# Patient Record
Sex: Female | Born: 1945
Health system: Southern US, Community
[De-identification: ages and names within clinical notes are randomized; demographics above are authoritative.]

## PROBLEM LIST (undated history)

## (undated) DIAGNOSIS — J302 Other seasonal allergic rhinitis: Secondary | ICD-10-CM

## (undated) DIAGNOSIS — M545 Low back pain, unspecified: Secondary | ICD-10-CM

## (undated) DIAGNOSIS — J069 Acute upper respiratory infection, unspecified: Secondary | ICD-10-CM

## (undated) DIAGNOSIS — J309 Allergic rhinitis, unspecified: Secondary | ICD-10-CM

## (undated) DIAGNOSIS — E785 Hyperlipidemia, unspecified: Secondary | ICD-10-CM

## (undated) DIAGNOSIS — I10 Essential (primary) hypertension: Secondary | ICD-10-CM

## (undated) DIAGNOSIS — F419 Anxiety disorder, unspecified: Secondary | ICD-10-CM

## (undated) DIAGNOSIS — J189 Pneumonia, unspecified organism: Secondary | ICD-10-CM

## (undated) DIAGNOSIS — C801 Malignant (primary) neoplasm, unspecified: Secondary | ICD-10-CM

## (undated) DIAGNOSIS — R252 Cramp and spasm: Secondary | ICD-10-CM

## (undated) DIAGNOSIS — E7800 Pure hypercholesterolemia, unspecified: Secondary | ICD-10-CM

## (undated) DIAGNOSIS — IMO0002 Reserved for concepts with insufficient information to code with codable children: Secondary | ICD-10-CM

## (undated) DIAGNOSIS — A64 Unspecified sexually transmitted disease: Secondary | ICD-10-CM

## (undated) DIAGNOSIS — J019 Acute sinusitis, unspecified: Secondary | ICD-10-CM

## (undated) DIAGNOSIS — M1990 Unspecified osteoarthritis, unspecified site: Secondary | ICD-10-CM

## (undated) DIAGNOSIS — M8580 Other specified disorders of bone density and structure, unspecified site: Secondary | ICD-10-CM

## (undated) HISTORY — DX: Allergic rhinitis, unspecified: J30.9

## (undated) HISTORY — DX: Other seasonal allergic rhinitis: J30.2

## (undated) HISTORY — DX: Reserved for concepts with insufficient information to code with codable children: IMO0002

## (undated) HISTORY — DX: Anxiety disorder, unspecified: F41.9

## (undated) HISTORY — PX: KNEE SURGERY: SHX244

## (undated) HISTORY — DX: Pure hypercholesterolemia, unspecified: E78.00

## (undated) HISTORY — DX: Hyperlipidemia, unspecified: E78.5

## (undated) HISTORY — PX: BREAST EXCISIONAL BIOPSY: SUR124

## (undated) HISTORY — DX: Cramp and spasm: R25.2

## (undated) HISTORY — DX: Acute upper respiratory infection, unspecified: J06.9

## (undated) HISTORY — PX: OOPHORECTOMY: SHX86

## (undated) HISTORY — DX: Unspecified sexually transmitted disease: A64

## (undated) HISTORY — PX: BREAST SURGERY: SHX581

## (undated) HISTORY — DX: Acute sinusitis, unspecified: J01.90

## (undated) HISTORY — DX: Low back pain, unspecified: M54.50

## (undated) HISTORY — PX: CATARACT EXTRACTION, BILATERAL: SHX1313

---
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
## 1984-01-29 HISTORY — PX: ABDOMINAL HYSTERECTOMY: SHX81

## 1993-01-28 HISTORY — PX: BREAST EXCISIONAL BIOPSY: SUR124

## 1993-01-28 HISTORY — PX: PELVIC LAPAROSCOPY: SHX162

## 1997-08-24 ENCOUNTER — Ambulatory Visit (HOSPITAL_COMMUNITY): Admission: RE | Admit: 1997-08-24 | Discharge: 1997-08-24 | Payer: Self-pay | Admitting: *Deleted

## 1998-04-17 ENCOUNTER — Other Ambulatory Visit: Admission: RE | Admit: 1998-04-17 | Discharge: 1998-04-17 | Payer: Self-pay | Admitting: Obstetrics and Gynecology

## 1999-04-17 ENCOUNTER — Other Ambulatory Visit: Admission: RE | Admit: 1999-04-17 | Discharge: 1999-04-17 | Payer: Self-pay | Admitting: Obstetrics and Gynecology

## 2000-02-07 ENCOUNTER — Encounter: Admission: RE | Admit: 2000-02-07 | Discharge: 2000-02-07 | Payer: Self-pay | Admitting: Obstetrics and Gynecology

## 2000-02-07 ENCOUNTER — Encounter: Payer: Self-pay | Admitting: Obstetrics and Gynecology

## 2000-04-16 ENCOUNTER — Other Ambulatory Visit: Admission: RE | Admit: 2000-04-16 | Discharge: 2000-04-16 | Payer: Self-pay | Admitting: Obstetrics and Gynecology

## 2001-02-04 ENCOUNTER — Encounter: Payer: Self-pay | Admitting: Obstetrics and Gynecology

## 2001-02-04 ENCOUNTER — Encounter: Admission: RE | Admit: 2001-02-04 | Discharge: 2001-02-04 | Payer: Self-pay | Admitting: Obstetrics and Gynecology

## 2001-04-20 ENCOUNTER — Other Ambulatory Visit: Admission: RE | Admit: 2001-04-20 | Discharge: 2001-04-20 | Payer: Self-pay | Admitting: Obstetrics and Gynecology

## 2002-04-19 ENCOUNTER — Other Ambulatory Visit: Admission: RE | Admit: 2002-04-19 | Discharge: 2002-04-19 | Payer: Self-pay | Admitting: Obstetrics and Gynecology

## 2003-04-20 ENCOUNTER — Other Ambulatory Visit: Admission: RE | Admit: 2003-04-20 | Discharge: 2003-04-20 | Payer: Self-pay | Admitting: Obstetrics and Gynecology

## 2003-09-03 ENCOUNTER — Emergency Department (HOSPITAL_COMMUNITY): Admission: EM | Admit: 2003-09-03 | Discharge: 2003-09-03 | Payer: Self-pay | Admitting: Emergency Medicine

## 2004-04-24 ENCOUNTER — Other Ambulatory Visit: Admission: RE | Admit: 2004-04-24 | Discharge: 2004-04-24 | Payer: Self-pay | Admitting: Obstetrics and Gynecology

## 2004-12-26 ENCOUNTER — Ambulatory Visit (HOSPITAL_COMMUNITY): Admission: RE | Admit: 2004-12-26 | Discharge: 2004-12-26 | Payer: Self-pay | Admitting: Gastroenterology

## 2005-02-06 ENCOUNTER — Encounter: Admission: RE | Admit: 2005-02-06 | Discharge: 2005-02-06 | Payer: Self-pay | Admitting: Obstetrics and Gynecology

## 2005-04-25 ENCOUNTER — Other Ambulatory Visit: Admission: RE | Admit: 2005-04-25 | Discharge: 2005-04-25 | Payer: Self-pay | Admitting: Obstetrics and Gynecology

## 2006-04-03 ENCOUNTER — Encounter: Admission: RE | Admit: 2006-04-03 | Discharge: 2006-04-03 | Payer: Self-pay | Admitting: Obstetrics and Gynecology

## 2006-04-28 ENCOUNTER — Other Ambulatory Visit: Admission: RE | Admit: 2006-04-28 | Discharge: 2006-04-28 | Payer: Self-pay | Admitting: Obstetrics and Gynecology

## 2007-01-29 HISTORY — PX: APPENDECTOMY: SHX54

## 2007-04-06 ENCOUNTER — Encounter: Admission: RE | Admit: 2007-04-06 | Discharge: 2007-04-06 | Payer: Self-pay | Admitting: Obstetrics and Gynecology

## 2007-04-09 ENCOUNTER — Encounter: Admission: RE | Admit: 2007-04-09 | Discharge: 2007-04-09 | Payer: Self-pay | Admitting: Internal Medicine

## 2007-04-09 ENCOUNTER — Encounter (INDEPENDENT_AMBULATORY_CARE_PROVIDER_SITE_OTHER): Payer: Self-pay | Admitting: Surgery

## 2007-04-09 ENCOUNTER — Ambulatory Visit (HOSPITAL_COMMUNITY): Admission: EM | Admit: 2007-04-09 | Discharge: 2007-04-11 | Payer: Self-pay | Admitting: Emergency Medicine

## 2007-04-30 ENCOUNTER — Other Ambulatory Visit: Admission: RE | Admit: 2007-04-30 | Discharge: 2007-04-30 | Payer: Self-pay | Admitting: Obstetrics and Gynecology

## 2008-04-27 ENCOUNTER — Encounter: Admission: RE | Admit: 2008-04-27 | Discharge: 2008-04-27 | Payer: Self-pay | Admitting: Obstetrics and Gynecology

## 2008-05-02 ENCOUNTER — Encounter: Payer: Self-pay | Admitting: Obstetrics and Gynecology

## 2008-05-02 ENCOUNTER — Ambulatory Visit: Payer: Self-pay | Admitting: Obstetrics and Gynecology

## 2008-05-02 ENCOUNTER — Other Ambulatory Visit: Admission: RE | Admit: 2008-05-02 | Discharge: 2008-05-02 | Payer: Self-pay | Admitting: Obstetrics and Gynecology

## 2008-12-07 ENCOUNTER — Ambulatory Visit: Payer: Self-pay | Admitting: Obstetrics and Gynecology

## 2008-12-15 ENCOUNTER — Ambulatory Visit: Payer: Self-pay | Admitting: Obstetrics and Gynecology

## 2009-04-28 ENCOUNTER — Encounter: Admission: RE | Admit: 2009-04-28 | Discharge: 2009-04-28 | Payer: Self-pay | Admitting: Obstetrics and Gynecology

## 2009-05-03 ENCOUNTER — Ambulatory Visit: Payer: Self-pay | Admitting: Obstetrics and Gynecology

## 2009-05-03 ENCOUNTER — Other Ambulatory Visit: Admission: RE | Admit: 2009-05-03 | Discharge: 2009-05-03 | Payer: Self-pay | Admitting: Obstetrics and Gynecology

## 2010-03-19 ENCOUNTER — Other Ambulatory Visit: Payer: Self-pay | Admitting: Obstetrics and Gynecology

## 2010-03-19 DIAGNOSIS — Z1231 Encounter for screening mammogram for malignant neoplasm of breast: Secondary | ICD-10-CM

## 2010-04-30 ENCOUNTER — Ambulatory Visit
Admission: RE | Admit: 2010-04-30 | Discharge: 2010-04-30 | Disposition: A | Discharge status: Home / Self Care | Payer: Medicare Other | Source: Ambulatory Visit | Attending: Obstetrics and Gynecology | Admitting: Obstetrics and Gynecology

## 2010-04-30 DIAGNOSIS — Z1231 Encounter for screening mammogram for malignant neoplasm of breast: Secondary | ICD-10-CM

## 2010-05-09 ENCOUNTER — Encounter (INDEPENDENT_AMBULATORY_CARE_PROVIDER_SITE_OTHER): Payer: Medicare Other | Admitting: Obstetrics and Gynecology

## 2010-05-09 ENCOUNTER — Other Ambulatory Visit (HOSPITAL_COMMUNITY)
Admission: RE | Admit: 2010-05-09 | Discharge: 2010-05-09 | Disposition: A | Discharge status: Home / Self Care | Payer: Medicare Other | Source: Ambulatory Visit | Attending: Obstetrics and Gynecology | Admitting: Obstetrics and Gynecology

## 2010-05-09 ENCOUNTER — Other Ambulatory Visit: Payer: Self-pay | Admitting: Obstetrics and Gynecology

## 2010-05-09 DIAGNOSIS — N952 Postmenopausal atrophic vaginitis: Secondary | ICD-10-CM

## 2010-05-09 DIAGNOSIS — N951 Menopausal and female climacteric states: Secondary | ICD-10-CM

## 2010-05-09 DIAGNOSIS — R350 Frequency of micturition: Secondary | ICD-10-CM

## 2010-05-09 DIAGNOSIS — Z124 Encounter for screening for malignant neoplasm of cervix: Secondary | ICD-10-CM

## 2010-05-16 ENCOUNTER — Encounter (INDEPENDENT_AMBULATORY_CARE_PROVIDER_SITE_OTHER): Payer: Medicare Other

## 2010-05-16 DIAGNOSIS — M949 Disorder of cartilage, unspecified: Secondary | ICD-10-CM

## 2010-05-16 DIAGNOSIS — M899 Disorder of bone, unspecified: Secondary | ICD-10-CM

## 2010-06-12 NOTE — H&P (Signed)
NAMELEILANNY, FLUITT NO.:  [id number]   MEDICAL RECORD NO.:  [id number]          PATIENT TYPE:  EMS   LOCATION:  ED                           FACILITY:  [facility]   PHYSICIAN:  Velora Heckler, MD      DATE OF BIRTH:  [birth year]   DATE OF ADMISSION:  04/09/2007  DATE OF DISCHARGE:                              HISTORY & PHYSICAL   PRIMARY CARE PHYSICIAN:  Deirdre Peer. Polite, M.D.   CHIEF COMPLAINT:  Abdominal pain, nausea, vomiting, rule out acute  appendicitis.   HISTORY OF PRESENT ILLNESS:  Diana Cisneros is a pleasant 65 year old white  female from [place], [place].  She presents with a 4 day  history of nausea, vomiting, fevers, chills and abdominal pain.  Over  the past few days, pain has localized to the lower abdomen.  She was  seen and evaluated by her primary physician and sent for CT scan abdomen  and pelvis at [facility].  A CT scan demonstrated evidence of  acute appendicitis with a large appendix extending into the right side  of the pelvis.  The patient was referred to [facility] Emergency  Department for general surgical evaluation and management.   PAST MEDICAL HISTORY:  1. Status post hysterectomy.  2. Status post left breast biopsy by Dr. Cyndia Bent.  3. History of degenerative joint disease lower back.   MEDICATIONS:  Mobic.   ALLERGIES:  NONE KNOWN.   SOCIAL HISTORY:  The patient is married.  She is accompanied by her  husband.  She drinks alcohol on occasion.  She does not smoke.   FAMILY HISTORY:  Unremarkable.   REVIEW OF SYSTEMS:  A 15-system review without significant other  findings except as noted above.   PHYSICAL EXAMINATION:  GENERAL:  A 65 year old thin white female in mild-  moderate discomfort.  VITAL SIGNS:  Temperature 98.2, pulse 96, respirations 18, blood  pressure 131/55.  HEENT:  Shows her to be normocephalic, atraumatic.  Sclerae are clear.  Conjunctiva are clear.  Pupils are equal and reactive.   Dentition good.  Mucous membranes are moist.  Voice normal.  NECK:  Anterior limitation of the neck shows to be symmetric.  Palpation  reveals no dominant thyroid nodules.  No lymphadenopathy.  No  tenderness.  LUNGS:  Clear to auscultation bilaterally without rales, rhonchi or  wheeze.  CARDIAC:  Regular rate and rhythm without murmur.  Peripheral pulses are  full.  ABDOMEN:  Soft, scaphoid.  Bowel sounds were present.  There is  mild-moderate tenderness to palpation and percussion particularly in the  lower quadrants.  There is voluntary guarding.  There is mild rebound  tenderness.  There is no figure of 4 sign.  There is no Rovsing's sign.  EXTREMITIES:  Nontender without edema.  NEUROLOGICALLY:  The patient is alert and oriented.  There is no sign of  tremor.   LABORATORY STUDIES:  White count 10.1, hemoglobin 13.1, platelet count  229,000, pro-thrombin time 14.4, INR 1.1.  Chemistry profile pending.   DIAGNOSTICS:  1. CT scan abdomen and pelvis  with evidence of acute appendicitis.  2. EKG shows normal sinus rhythm.  No acute changes.   IMPRESSION:  Acute appendicitis.   PLAN:  The patient will be admitted on the general surgical service at  [facility].  The patient will be started on  intravenous antibiotics.  She will be prepared and taken directly to the  operating room for laparoscopic appendectomy.  Procedure was discussed  with the patient and her husband.  Risk and benefits were reviewed.  I  quoted them approximately a 10% chance of conversion to open surgery.  They understand and wish to proceed.      Velora Heckler, MD  Electronically Signed     TMG/MEDQ  D:  04/09/2007  T:  04/11/2007  Job:  [id number]   cc:   Deirdre Peer. Polite, M.D.   Candyce Churn, M.D.  Fax: [phone]   Velora Heckler, MD  [phone] [place]  [place] [address]

## 2010-06-12 NOTE — Op Note (Signed)
NAMEDEMIYA, MAGNO                  ACCOUNT NO.:  [id number]   MEDICAL RECORD NO.:  [id number]          PATIENT TYPE:  INP   LOCATION:  0098                         FACILITY:  [facility]   PHYSICIAN:  Velora Heckler, MD      DATE OF BIRTH:  [birth year]   DATE OF PROCEDURE:  04/09/2007  DATE OF DISCHARGE:                               OPERATIVE REPORT   PREOPERATIVE DIAGNOSIS:  Acute appendicitis.   POSTOPERATIVE DIAGNOSIS:  Acute appendicitis.   PROCEDURE:  Laparoscopic appendectomy.   SURGEON:  Velora Heckler, MD, FACS   ANESTHESIA:  General.   ESTIMATED BLOOD LOSS:  Minimal.   PREPARATION:  Betadine.   COMPLICATIONS:  None.   INDICATIONS:  The patient is a 65 year old white female who presents to  the emergency department at [facility] after a 4-day  history of nausea, vomiting and abdominal pain.  The patient was seen by  her primary physician and scheduled for CT scan abdomen and pelvis which  was performed on March 12.  This showed findings consistent with acute  appendicitis.  The patient was brought to [facility] emergency  department for general surgical consultation and management.   DESCRIPTION OF PROCEDURE:  The procedure is done in OR #1 at the [facility].  The patient was brought to the operating room,  placed in a supine position on the operating room table.  Following the  administration of general anesthesia, the patient is positioned and then  prepped and draped in the usual strict aseptic fashion.  After  ascertaining that an adequate level of anesthesia had been achieved, a  previous infraumbilical incision was reopened with a #15 blade.  Dissection was carried through subcutaneous tissues and scar tissue to  the fascia.  Hemostasis was obtained with the electrocautery.  The  fascia was incised in the midline and the peritoneal cavity is entered  cautiously.  A zero Vicryl pursestring suture was placed in the fascia.  A  Hasson cannula was introduced and secured with a pursestring suture.  The abdomen was insufflated with carbon dioxide.  A laparoscope was  introduced and the abdomen explored.  The patient is placed in  Trendelenburg.  Omentum was mobilized off the cecum.  The cecum was  grasped and gently mobilized cephalad.  The base of the appendix was  identified.  It does extend along the right pelvic sidewall.  It is  grasped and gently mobilized out of the pelvis.  There is no gross sign  of perforation.  There is no sign of abscess.  The appendix was markedly  indurated, erythematous and swollen.  The operative ports are placed in  the right upper quadrant and left lower quadrant.  The mesoappendix was  taken down with the harmonic scalpel.  The base of the appendix is  transected with an Endo-GIA stapler.  The appendix was placed into an  EndoCatch bag and withdrawn through the left lower quadrant port.  The  staple line was inspected and shows good hemostasis.  The pelvis was  irrigated with warm  saline which is evacuated.  The ports were removed  under direct vision and good hemostasis was noted at all port sites.  Pneumoperitoneum was released and the umbilical port is removed and  pursestring suture tied securely.  The wounds are anesthetized with  local  anesthetic.  All wounds were closed with interrupted 4-0 Vicryl  subcuticular sutures.  The wounds are washed and dried and Benzoin and  Steri-Strips are applied.  Sterile dressings are applied.  The patient  is awakened from anesthesia and brought to the recovery room in stable  condition.  The patient tolerated the procedure well.      Velora Heckler, MD  Electronically Signed     TMG/MEDQ  D:  04/09/2007  T:  04/11/2007  Job:  [id number]   cc:   Deirdre Peer. Polite, M.D.   Candyce Churn, M.D.  Fax: [phone]

## 2010-06-15 NOTE — Op Note (Signed)
NAMEBRONTE, Diana Cisneros                  ACCOUNT NO.:  [id number]   MEDICAL RECORD NO.:  [id number]          PATIENT TYPE:  AMB   LOCATION:  ENDO                         FACILITY:  [facility]   PHYSICIAN:  Danise Edge, M.D.   DATE OF BIRTH:  [birth year]   DATE OF PROCEDURE:  12/26/2004  DATE OF DISCHARGE:                                 OPERATIVE REPORT   PROCEDURE:  Screening colonoscopy.   REFERRING PHYSICIAN:  Ladell Pier, M.D.   INDICATIONS FOR PROCEDURE:  Ms. Rosia Syme is a 65 year old female born  1945/04/05.  Ms. Mullenax is scheduled to undergo her first screening  colonoscopy with polypectomy to prevent colon cancer.   ENDOSCOPIST:  Danise Edge, M.D.   PREMEDICATION:  Versed 7.5 mg, Demerol 50 mg.   PROCEDURE:  After obtaining informed consent, Ms. Vidovich was placed in the  left lateral decubitus position.  I administered intravenous Demerol and  intravenous Versed to achieve conscious sedation for the procedure.  The  patient's blood pressure, oxygen saturation, and cardiac rhythm were  monitored throughout the procedure and documented in the medical record.   Anal inspection and digital rectal exam was normal.  The Olympus adjustable  pediatric colonoscope was introduced into the rectum and advanced to the  cecum.  A normal-appearing ileocecal valve and appendiceal orifice were  identified.  Colonic preparation for the exam today was satisfactory.   RECTUM:  Normal.  Retroflexed view of the distal rectum normal.   SIGMOID COLON/DESCENDING COLON:  Normal.   SPLENIC FLEXURE:  Normal.   TRANSVERSE COLON:  Normal.   HEPATIC FLEXURE:  Normal.   ASCENDING COLON:  Normal.   CECUM AND ILEOCECAL VALVE:  Normal.   ASSESSMENT:  Normal screening proctocolonoscopy of the cecum.           ______________________________  Danise Edge, M.D.     MJ/MEDQ  D:  12/26/2004  T:  12/26/2004  Job:  [id number]   cc:   Ladell Pier, M.D.  Fax: [phone]

## 2010-08-13 ENCOUNTER — Ambulatory Visit (INDEPENDENT_AMBULATORY_CARE_PROVIDER_SITE_OTHER): Payer: Medicare Other | Admitting: Obstetrics and Gynecology

## 2010-08-13 DIAGNOSIS — N390 Urinary tract infection, site not specified: Secondary | ICD-10-CM

## 2010-08-13 DIAGNOSIS — R82998 Other abnormal findings in urine: Secondary | ICD-10-CM

## 2010-08-13 DIAGNOSIS — R350 Frequency of micturition: Secondary | ICD-10-CM

## 2010-08-13 DIAGNOSIS — N949 Unspecified condition associated with female genital organs and menstrual cycle: Secondary | ICD-10-CM

## 2010-08-22 ENCOUNTER — Encounter: Payer: Self-pay | Admitting: Obstetrics and Gynecology

## 2010-08-22 ENCOUNTER — Ambulatory Visit (INDEPENDENT_AMBULATORY_CARE_PROVIDER_SITE_OTHER): Payer: Medicare Other | Admitting: Obstetrics and Gynecology

## 2010-08-22 DIAGNOSIS — Z5189 Encounter for other specified aftercare: Secondary | ICD-10-CM

## 2010-08-22 NOTE — Progress Notes (Signed)
Diana Cisneros came back today for followup urinalysis. All white blood cells and red blood cells were now gone. She has no symptoms. She was reassured.

## 2010-10-04 ENCOUNTER — Ambulatory Visit (INDEPENDENT_AMBULATORY_CARE_PROVIDER_SITE_OTHER): Payer: Medicare Other | Admitting: Obstetrics and Gynecology

## 2010-10-04 ENCOUNTER — Encounter: Payer: Self-pay | Admitting: Obstetrics and Gynecology

## 2010-10-04 DIAGNOSIS — N390 Urinary tract infection, site not specified: Secondary | ICD-10-CM

## 2010-10-04 DIAGNOSIS — R82998 Other abnormal findings in urine: Secondary | ICD-10-CM

## 2010-10-04 DIAGNOSIS — R350 Frequency of micturition: Secondary | ICD-10-CM

## 2010-10-04 NOTE — Progress Notes (Signed)
Addended by: Landis Martins R on: 10/04/2010 12:59 PM   Modules accepted: Orders

## 2010-10-04 NOTE — Progress Notes (Signed)
The patient came to see me today with dysuria frequency and urgency. Her urinalysis showed 20+ white blood cells. We just treat her for UTI in July with Macrobid. She become asymptomatic with treatment. Her followup UA after treatment was normal. Prior to this she had not had a UTI in many many years.  Assessment: Recurrent UTI  Plan: Cipro 500 mg twice a day for 7 days. Followup UA and culture in one week.

## 2010-10-22 LAB — DIFFERENTIAL
Basophils Absolute: 0.1
Basophils Relative: 1
Eosinophils Absolute: 0.2
Eosinophils Relative: 2
Lymphocytes Relative: 18
Lymphs Abs: 1.8
Monocytes Absolute: 0.6
Monocytes Relative: 6
Neutro Abs: 7.4
Neutrophils Relative %: 74

## 2010-10-22 LAB — CBC
HCT: 38.7
Hemoglobin: 13.1
MCHC: 33.9
MCV: 89.6
Platelets: 229
RBC: 4.32
RDW: 13.1
WBC: 10.1

## 2010-10-22 LAB — URINALYSIS, ROUTINE W REFLEX MICROSCOPIC
Glucose, UA: NEGATIVE
Nitrite: NEGATIVE
Protein, ur: NEGATIVE
pH: 6

## 2010-10-22 LAB — BASIC METABOLIC PANEL
Chloride: 100
Creatinine, Ser: 1.22 — ABNORMAL HIGH
GFR calc Af Amer: 54 — ABNORMAL LOW
GFR calc non Af Amer: 45 — ABNORMAL LOW

## 2010-10-22 LAB — PROTIME-INR
INR: 1.1
Prothrombin Time: 14.4

## 2011-01-07 ENCOUNTER — Other Ambulatory Visit: Payer: Self-pay | Admitting: Obstetrics and Gynecology

## 2011-04-02 ENCOUNTER — Other Ambulatory Visit: Payer: Self-pay | Admitting: Obstetrics and Gynecology

## 2011-04-02 DIAGNOSIS — Z1231 Encounter for screening mammogram for malignant neoplasm of breast: Secondary | ICD-10-CM

## 2011-04-23 ENCOUNTER — Ambulatory Visit (INDEPENDENT_AMBULATORY_CARE_PROVIDER_SITE_OTHER): Payer: Medicare Other | Admitting: Obstetrics and Gynecology

## 2011-04-23 ENCOUNTER — Other Ambulatory Visit: Payer: Self-pay | Admitting: Obstetrics and Gynecology

## 2011-04-23 ENCOUNTER — Telehealth: Payer: Self-pay | Admitting: *Deleted

## 2011-04-23 DIAGNOSIS — R350 Frequency of micturition: Secondary | ICD-10-CM

## 2011-04-23 DIAGNOSIS — R300 Dysuria: Secondary | ICD-10-CM

## 2011-04-23 LAB — URINALYSIS W MICROSCOPIC + REFLEX CULTURE
Casts: NONE SEEN
Crystals: NONE SEEN
Nitrite: NEGATIVE
Specific Gravity, Urine: <1.005 — ABNORMAL LOW (ref 1.005–1.030)
Urobilinogen, UA: 0.2 mg/dL (ref 0.0–1.0)
pH: 5 (ref 5.0–8.0)

## 2011-04-23 MED ORDER — CIPROFLOXACIN HCL 500 MG PO TABS: 500.0000 mg | ORAL_TABLET | Freq: Two times a day (BID) | ORAL | Status: AC

## 2011-04-23 NOTE — Progress Notes (Signed)
Patient was doing well until this morning when she woke up and had to constantly go to the bathroom. She also has noticed pain after urination. We have treated her for UTI with Macrodantin last summer. That was the first UTI she had ever had.  Urinalysis: Too numerous to count white blood cells and red blood cells. Many bacteria present.  Assessment: Urinary tract infection  Plan: Cipro 500 mg twice a day for 7 days. Urine was cultured today. She will return for followup urinalysis in one week.

## 2011-04-23 NOTE — Telephone Encounter (Signed)
Pt called stating that pharmacy never got cipro 500 mg, rx sent to pharmacy. Pt informed

## 2011-05-01 ENCOUNTER — Ambulatory Visit
Admission: RE | Admit: 2011-05-01 | Discharge: 2011-05-01 | Disposition: A | Discharge status: Home / Self Care | Payer: Medicare Other | Source: Ambulatory Visit | Attending: Obstetrics and Gynecology | Admitting: Obstetrics and Gynecology

## 2011-05-01 DIAGNOSIS — Z1231 Encounter for screening mammogram for malignant neoplasm of breast: Secondary | ICD-10-CM

## 2011-05-15 ENCOUNTER — Encounter: Payer: Self-pay | Admitting: Obstetrics and Gynecology

## 2011-05-15 ENCOUNTER — Ambulatory Visit (INDEPENDENT_AMBULATORY_CARE_PROVIDER_SITE_OTHER): Payer: Medicare Other | Admitting: Obstetrics and Gynecology

## 2011-05-15 VITALS — BP 120/74 | Ht 61.0 in | Wt 140.0 lb

## 2011-05-15 DIAGNOSIS — M8580 Other specified disorders of bone density and structure, unspecified site: Secondary | ICD-10-CM

## 2011-05-15 DIAGNOSIS — IMO0002 Reserved for concepts with insufficient information to code with codable children: Secondary | ICD-10-CM | POA: Insufficient documentation

## 2011-05-15 DIAGNOSIS — N390 Urinary tract infection, site not specified: Secondary | ICD-10-CM

## 2011-05-15 DIAGNOSIS — N952 Postmenopausal atrophic vaginitis: Secondary | ICD-10-CM

## 2011-05-15 DIAGNOSIS — R252 Cramp and spasm: Secondary | ICD-10-CM

## 2011-05-15 DIAGNOSIS — M949 Disorder of cartilage, unspecified: Secondary | ICD-10-CM

## 2011-05-15 NOTE — Progress Notes (Signed)
Patient came back today for further followup. In the last month we treated her with her urinary tract infection with relief of all her symptoms. We also previously treated her last summer. She does  not think there's a sexual relationship to the infections. She left [place] a urine today for treatment of cure. Her menopausal symptoms are  mostly resolved. She does have vaginal dryness with some dyspareunia but is fine with just using a lubricant rather than vaginal estrogen. She is having no pelvic pain. She is up-to-date on mammograms. Her last bone density showed osteopenia without elevated FRAX risk. She's had no fractures. She takes her calcium and  her vitamin D. Her leg cramps are responding to over-the-counter medication.  ROS: 12 system review done. Pertinent positives above. Other positive is degenerative disc disease.  HEENT: Within normal limits. Diana Cisneros present Neck: No masses. Supraclavicular lymph nodes: Not enlarged. Breasts: Examined in both sitting and lying position. Symmetrical without skin changes or masses. Abdomen: Soft no masses guarding or rebound. No hernias. Pelvic: External within normal limits. BUS within normal limits. Vaginal examination shows poor estrogen effect, no cystocele enterocele or rectocele. Cervix and uterus absent. Adnexa within normal limits. Rectovaginal confirmatory. Extremities within normal limits.  Assessment: #1. Urinary tract infection #2. Atrophic vaginitis #3. Osteopenia #4. Leg cramps  Plan: Urinalysis checked. Discussed vaginal estrogen for persistent UTIs or atrophic issues. I don't believe she needs urological referral at the moment. Continue yearly mammograms. Continue bone densities every other year.

## 2011-05-16 LAB — URINALYSIS W MICROSCOPIC + REFLEX CULTURE
Bilirubin Urine: NEGATIVE
Crystals: NONE SEEN
Glucose, UA: NEGATIVE mg/dL
Leukocytes, UA: NEGATIVE
Protein, ur: NEGATIVE mg/dL
Specific Gravity, Urine: 1.011 (ref 1.005–1.030)
Squamous Epithelial / LPF: NONE SEEN
Urobilinogen, UA: 0.2 mg/dL (ref 0.0–1.0)

## 2011-10-10 ENCOUNTER — Other Ambulatory Visit: Payer: Self-pay | Admitting: Obstetrics and Gynecology

## 2012-02-21 ENCOUNTER — Other Ambulatory Visit: Payer: Self-pay | Admitting: Obstetrics and Gynecology

## 2012-02-21 NOTE — Telephone Encounter (Signed)
Per Dr. Verl Dicker 04/28/06 note patient "has vulvar inflammation from time to time" and he prescribed Diprisone Cream to apply prn.  She has had it refilled a couple of times since 2008.

## 2012-03-24 ENCOUNTER — Other Ambulatory Visit: Payer: Self-pay | Admitting: Gynecology

## 2012-03-24 DIAGNOSIS — Z1231 Encounter for screening mammogram for malignant neoplasm of breast: Secondary | ICD-10-CM

## 2012-05-01 ENCOUNTER — Ambulatory Visit
Admission: RE | Admit: 2012-05-01 | Discharge: 2012-05-01 | Disposition: A | Discharge status: Home / Self Care | Payer: Medicare Other | Source: Ambulatory Visit | Attending: Gynecology | Admitting: Gynecology

## 2012-05-01 DIAGNOSIS — Z1231 Encounter for screening mammogram for malignant neoplasm of breast: Secondary | ICD-10-CM

## 2012-05-05 ENCOUNTER — Ambulatory Visit: Payer: Medicare Other

## 2012-05-18 ENCOUNTER — Encounter: Payer: Self-pay | Admitting: Gynecology

## 2012-05-18 ENCOUNTER — Encounter: Payer: Self-pay | Admitting: Obstetrics and Gynecology

## 2012-05-18 ENCOUNTER — Ambulatory Visit (INDEPENDENT_AMBULATORY_CARE_PROVIDER_SITE_OTHER): Payer: Medicare Other | Admitting: Gynecology

## 2012-05-18 VITALS — BP 120/74 | Ht 62.0 in | Wt 129.0 lb

## 2012-05-18 DIAGNOSIS — M949 Disorder of cartilage, unspecified: Secondary | ICD-10-CM

## 2012-05-18 DIAGNOSIS — M8580 Other specified disorders of bone density and structure, unspecified site: Secondary | ICD-10-CM

## 2012-05-18 DIAGNOSIS — N952 Postmenopausal atrophic vaginitis: Secondary | ICD-10-CM

## 2012-05-18 NOTE — Patient Instructions (Signed)
Follow up one year, sooner as needed.

## 2012-05-18 NOTE — Progress Notes (Signed)
Diana Cisneros 12-02-1945 [id number]        66 y.o.  G0P0 for followup exam.  Several issues noted below.  Past medical history,surgical history, medications, allergies, family history and social history were all reviewed and documented in the EPIC chart. ROS:  Was performed and pertinent positives and negatives are included in the history.  Exam: Kim assistant Filed Vitals:   05/18/12 1056  BP: 120/74  Height: 5\' 2"  (1.575 m)  Weight: 129 lb (58.514 kg)   General appearance  Normal Skin grossly normal Head/Neck normal with no cervical or supraclavicular adenopathy thyroid normal Lungs  clear Cardiac RR, without RMG Abdominal  soft, nontender, without masses, organomegaly or hernia Breasts  examined lying and sitting without masses, retractions, discharge or axillary adenopathy. Pelvic  Ext/BUS/vagina  normal with atrophic changes  Adnexa  Without masses or tenderness    Anus and perineum  normal   Rectovaginal  normal sphincter tone without palpated masses or tenderness.    Assessment/Plan:  67 y.o. G0P0 female for annual exam.   1. Status post TAH LSO subsequent RSO for history of endometriosis. Without significant postmenopausal symptoms of hot flashes/night sweats. We'll continue to monitor. 2. Atrophic vaginitis. Some dryness using OTC products which help. Discussed possible estrogen treatment and she declines. We'll followup as needed. 3. Osteopenia. DEXA 04/2010 T score -1.2 FRAX 8.4%/0.7% stable from prior DEXA on serial studies. Recommend repeat in several years. Continue on extra calcium and vitamin D as she is doing. 4. Mammography 04/2012. Continued annual mammography. SBE monthly reviewed. 5. Pap smear 2012. No Pap smear done today. Reviewed current screening guidelines. She is over the age of 54 and status post hysterectomy for benign indications. Options to stop screening altogether versus less frequent screening interval reviewed. We'll readdress on an annual  basis. 6. Colonoscopy 2008 with recommended repeat in 10 years. 7. Health maintenance. Has an appointment to see her primary physician in one month. No blood work done as this will all be done through their office. Followup one year, sooner as needed.    Dara Lords MD, 11:21 AM 05/18/2012

## 2012-05-19 LAB — URINALYSIS W MICROSCOPIC + REFLEX CULTURE
Casts: NONE SEEN
Crystals: NONE SEEN
Glucose, UA: NEGATIVE mg/dL
Hgb urine dipstick: NEGATIVE
Leukocytes, UA: NEGATIVE
Specific Gravity, Urine: 1.008 (ref 1.005–1.030)
Squamous Epithelial / LPF: NONE SEEN
pH: 5 (ref 5.0–8.0)

## 2012-11-18 ENCOUNTER — Ambulatory Visit: Payer: Medicare Other | Admitting: Podiatry

## 2012-11-23 ENCOUNTER — Ambulatory Visit (INDEPENDENT_AMBULATORY_CARE_PROVIDER_SITE_OTHER): Payer: Medicare Other | Admitting: Podiatry

## 2012-11-23 ENCOUNTER — Encounter: Payer: Self-pay | Admitting: Podiatry

## 2012-11-23 VITALS — BP 125/72 | HR 76 | Resp 12

## 2012-11-23 DIAGNOSIS — L84 Corns and callosities: Secondary | ICD-10-CM

## 2012-11-24 NOTE — Progress Notes (Signed)
Subjective:     Patient ID: Diana Cisneros, female   DOB: Aug 14, 1945, 67 y.o.   MRN: [id number]  HPI patient presents with lesions on the bottom of both feet that become painful  Review of Systems     Objective:   Physical Exam  Nursing note and vitals reviewed. Cardiovascular: Intact distal pulses.    keratotic lesions sub-second metatarsal bi lateral    Assessment:     Keratotic lesion secondary to bone pressure    Plan:     Debridement of lesions bilateral with no iatrogenic bleeding noted

## 2013-02-15 ENCOUNTER — Encounter: Payer: Self-pay | Admitting: Podiatry

## 2013-02-15 ENCOUNTER — Ambulatory Visit (INDEPENDENT_AMBULATORY_CARE_PROVIDER_SITE_OTHER): Payer: Medicare Other | Admitting: Podiatry

## 2013-02-15 VITALS — BP 119/75 | HR 86 | Resp 16

## 2013-02-15 DIAGNOSIS — L84 Corns and callosities: Secondary | ICD-10-CM

## 2013-02-15 NOTE — Progress Notes (Signed)
Subjective:     Patient ID: Diana Cisneros, female   DOB: [birth year], 68 y.o.   MRN: [id number]  HPI patient presents with thick keratotic lesion sub-third metatarsal both feet are painful and thick second toenails both feet   Review of Systems     Objective:   Physical Exam Neurovascular status unchanged with nail disease second both feet and fifth keratotic lesions sub-third metatarsal both feet    Assessment:     Mycotic nails with pain 1-5 both feet and lesions of both feet    Plan:     Debridement painful nail bed 1-5 both feet and debridement lesions both feet with no bleeding noted

## 2013-03-30 ENCOUNTER — Other Ambulatory Visit: Payer: Self-pay

## 2013-03-30 DIAGNOSIS — Z1231 Encounter for screening mammogram for malignant neoplasm of breast: Secondary | ICD-10-CM

## 2013-04-14 ENCOUNTER — Ambulatory Visit: Payer: Medicare Other | Admitting: Podiatry

## 2013-04-15 ENCOUNTER — Encounter: Payer: Self-pay | Admitting: Podiatry

## 2013-04-15 ENCOUNTER — Ambulatory Visit (INDEPENDENT_AMBULATORY_CARE_PROVIDER_SITE_OTHER): Payer: Medicare Other | Admitting: Podiatry

## 2013-04-15 VITALS — BP 129/78 | HR 76 | Resp 16

## 2013-04-15 DIAGNOSIS — L84 Corns and callosities: Secondary | ICD-10-CM

## 2013-04-15 DIAGNOSIS — M79609 Pain in unspecified limb: Secondary | ICD-10-CM

## 2013-04-15 DIAGNOSIS — B351 Tinea unguium: Secondary | ICD-10-CM

## 2013-04-16 NOTE — Progress Notes (Signed)
Subjective:     Patient ID: Diana Cisneros, female   DOB: May 07, 1945, 68 y.o.   MRN: [id number]  HPI patient presents with callus formation plantar aspect of metatarsals both feet   Review of Systems     Objective:   Physical Exam Neurovascular status intact with thick plantar lesions both feet    Assessment:     Lesions that are painful    Plan:     Debridement painful lesions both feet

## 2013-05-03 ENCOUNTER — Ambulatory Visit
Admission: RE | Admit: 2013-05-03 | Discharge: 2013-05-03 | Disposition: A | Discharge status: Home / Self Care | Payer: Medicare Other | Source: Ambulatory Visit

## 2013-05-03 DIAGNOSIS — Z1231 Encounter for screening mammogram for malignant neoplasm of breast: Secondary | ICD-10-CM

## 2013-05-19 ENCOUNTER — Ambulatory Visit (INDEPENDENT_AMBULATORY_CARE_PROVIDER_SITE_OTHER): Payer: Medicare Other | Admitting: Gynecology

## 2013-05-19 ENCOUNTER — Encounter: Payer: Self-pay | Admitting: Gynecology

## 2013-05-19 VITALS — BP 130/80 | Ht 62.0 in | Wt 138.0 lb

## 2013-05-19 DIAGNOSIS — M899 Disorder of bone, unspecified: Secondary | ICD-10-CM

## 2013-05-19 DIAGNOSIS — M949 Disorder of cartilage, unspecified: Secondary | ICD-10-CM

## 2013-05-19 DIAGNOSIS — M8580 Other specified disorders of bone density and structure, unspecified site: Secondary | ICD-10-CM

## 2013-05-19 DIAGNOSIS — N952 Postmenopausal atrophic vaginitis: Secondary | ICD-10-CM

## 2013-05-19 NOTE — Patient Instructions (Signed)
Schedule bone density. Followup in one year for annual exam, sooner if any issues.  Health Maintenance, Female A healthy lifestyle and preventative care can promote health and wellness.  Maintain regular health, dental, and eye exams.  Eat a healthy diet. Foods like vegetables, fruits, whole grains, low-fat dairy products, and lean protein foods contain the nutrients you need without too many calories. Decrease your intake of foods high in solid fats, added sugars, and salt. Get information about a proper diet from your caregiver, if necessary.  Regular physical exercise is one of the most important things you can do for your health. Most adults should get at least 150 minutes of moderate-intensity exercise (any activity that increases your heart rate and causes you to sweat) each week. In addition, most adults need muscle-strengthening exercises on 2 or more days a week.   Maintain a healthy weight. The body mass index (BMI) is a screening tool to identify possible weight problems. It provides an estimate of body fat based on height and weight. Your caregiver can help determine your BMI, and can help you achieve or maintain a healthy weight. For adults 20 years and older:  A BMI below 18.5 is considered underweight.  A BMI of 18.5 to 24.9 is normal.  A BMI of 25 to 29.9 is considered overweight.  A BMI of 30 and above is considered obese.  Maintain normal blood lipids and cholesterol by exercising and minimizing your intake of saturated fat. Eat a balanced diet with plenty of fruits and vegetables. Blood tests for lipids and cholesterol should begin at age 23 and be repeated every 5 years. If your lipid or cholesterol levels are high, you are over 50, or you are a high risk for heart disease, you may need your cholesterol levels checked more frequently.Ongoing high lipid and cholesterol levels should be treated with medicines if diet and exercise are not effective.  If you smoke, find out  from your caregiver how to quit. If you do not use tobacco, do not start.  Lung cancer screening is recommended for adults aged 47 80 years who are at high risk for developing lung cancer because of a history of smoking. Yearly low-dose computed tomography (CT) is recommended for people who have at least a 30-pack-year history of smoking and are a current smoker or have quit within the past 15 years. A pack year of smoking is smoking an average of 1 pack of cigarettes a day for 1 year (for example: 1 pack a day for 30 years or 2 packs a day for 15 years). Yearly screening should continue until the smoker has stopped smoking for at least 15 years. Yearly screening should also be stopped for people who develop a health problem that would prevent them from having lung cancer treatment.  If you are pregnant, do not drink alcohol. If you are breastfeeding, be very cautious about drinking alcohol. If you are not pregnant and choose to drink alcohol, do not exceed 1 drink per day. One drink is considered to be 12 ounces (355 mL) of beer, 5 ounces (148 mL) of wine, or 1.5 ounces (44 mL) of liquor.  Avoid use of street drugs. Do not share needles with anyone. Ask for help if you need support or instructions about stopping the use of drugs.  High blood pressure causes heart disease and increases the risk of stroke. Blood pressure should be checked at least every 1 to 2 years. Ongoing high blood pressure should be treated with  medicines, if weight loss and exercise are not effective.  If you are 6 to 68 years old, ask your caregiver if you should take aspirin to prevent strokes.  Diabetes screening involves taking a blood sample to check your fasting blood sugar level. This should be done once every 3 years, after age 42, if you are within normal weight and without risk factors for diabetes. Testing should be considered at a younger age or be carried out more frequently if you are overweight and have at least 1  risk factor for diabetes.  Breast cancer screening is essential preventative care for women. You should practice "breast self-awareness." This means understanding the normal appearance and feel of your breasts and may include breast self-examination. Any changes detected, no matter how small, should be reported to a caregiver. Women in their 72s and 30s should have a clinical breast exam (CBE) by a caregiver as part of a regular health exam every 1 to 3 years. After age 76, women should have a CBE every year. Starting at age 36, women should consider having a mammogram (breast X-ray) every year. Women who have a family history of breast cancer should talk to their caregiver about genetic screening. Women at a high risk of breast cancer should talk to their caregiver about having an MRI and a mammogram every year.  Breast cancer gene (BRCA)-related cancer risk assessment is recommended for women who have family members with BRCA-related cancers. BRCA-related cancers include breast, ovarian, tubal, and peritoneal cancers. Having family members with these cancers may be associated with an increased risk for harmful changes (mutations) in the breast cancer genes BRCA1 and BRCA2. Results of the assessment will determine the need for genetic counseling and BRCA1 and BRCA2 testing.  The Pap test is a screening test for cervical cancer. Women should have a Pap test starting at age 41. Between ages 55 and 51, Pap tests should be repeated every 2 years. Beginning at age 28, you should have a Pap test every 3 years as long as the past 3 Pap tests have been normal. If you had a hysterectomy for a problem that was not cancer or a condition that could lead to cancer, then you no longer need Pap tests. If you are between ages 39 and 66, and you have had normal Pap tests going back 10 years, you no longer need Pap tests. If you have had past treatment for cervical cancer or a condition that could lead to cancer, you need Pap  tests and screening for cancer for at least 20 years after your treatment. If Pap tests have been discontinued, risk factors (such as a new sexual partner) need to be reassessed to determine if screening should be resumed. Some women have medical problems that increase the chance of getting cervical cancer. In these cases, your caregiver may recommend more frequent screening and Pap tests.  The human papillomavirus (HPV) test is an additional test that may be used for cervical cancer screening. The HPV test looks for the virus that can cause the cell changes on the cervix. The cells collected during the Pap test can be tested for HPV. The HPV test could be used to screen women aged 26 years and older, and should be used in women of any age who have unclear Pap test results. After the age of 53, women should have HPV testing at the same frequency as a Pap test.  Colorectal cancer can be detected and often prevented. Most routine colorectal cancer  screening begins at the age of 28 and continues through age 51. However, your caregiver may recommend screening at an earlier age if you have risk factors for colon cancer. On a yearly basis, your caregiver may provide home test kits to check for hidden blood in the stool. Use of a small camera at the end of a tube, to directly examine the colon (sigmoidoscopy or colonoscopy), can detect the earliest forms of colorectal cancer. Talk to your caregiver about this at age 39, when routine screening begins. Direct examination of the colon should be repeated every 5 to 10 years through age 46, unless early forms of pre-cancerous polyps or small growths are found.  Hepatitis C blood testing is recommended for all people born from [birth year] through 1965 and any individual with known risks for hepatitis C.  Practice safe sex. Use condoms and avoid high-risk sexual practices to reduce the spread of sexually transmitted infections (STIs). Sexually active women aged 30 and younger  should be checked for Chlamydia, which is a common sexually transmitted infection. Older women with new or multiple partners should also be tested for Chlamydia. Testing for other STIs is recommended if you are sexually active and at increased risk.  Osteoporosis is a disease in which the bones lose minerals and strength with aging. This can result in serious bone fractures. The risk of osteoporosis can be identified using a bone density scan. Women ages 39 and over and women at risk for fractures or osteoporosis should discuss screening with their caregivers. Ask your caregiver whether you should be taking a calcium supplement or vitamin D to reduce the rate of osteoporosis.  Menopause can be associated with physical symptoms and risks. Hormone replacement therapy is available to decrease symptoms and risks. You should talk to your caregiver about whether hormone replacement therapy is right for you.  Use sunscreen. Apply sunscreen liberally and repeatedly throughout the day. You should seek shade when your shadow is shorter than you. Protect yourself by wearing long sleeves, pants, a wide-brimmed hat, and sunglasses year round, whenever you are outdoors.  Notify your caregiver of new moles or changes in moles, especially if there is a change in shape or color. Also notify your caregiver if a mole is larger than the size of a pencil eraser.  Stay current with your immunizations. Document Released: 07/30/2010 Document Revised: 05/11/2012 Document Reviewed: 07/30/2010 [facility] Patient Information 2014 [place].

## 2013-05-19 NOTE — Progress Notes (Signed)
Diana Cisneros 08-01-1945 [id number]        68 y.o.  G0P0 for followup exam.  Several issues noted below.  Past medical history,surgical history, problem list, medications, allergies, family history and social history were all reviewed and documented as reviewed in the EPIC chart.  ROS:  12 system ROS performed with pertinent positives and negatives included in the history, assessment and plan.  Included Systems: General, HEENT, Neck, Cardiovascular, Pulmonary, Gastrointestinal, Genitourinary, Musculoskeletal, Dermatologic, Endocrine, Hematological, Neurologic, Psychiatric Additional significant findings : Seasonal allergy symptoms treated with OTC products   Exam: Kim assistant Filed Vitals:   05/19/13 0838  BP: 130/80  Height: 5\' 2"  (1.575 m)  Weight: 138 lb (62.596 kg)   General appearance:  Normal affect, orientation and appearance. Skin: Grossly normal HEENT: Normal without gross oral lesions, cervical or supraclavicular adenopathy. Thyroid normal.  Lungs:  Clear without wheezing, rales or rhonchi Cardiac: RR, without RMG Abdominal:  Soft, nontender, without masses, guarding, rebound, organomegaly or hernia Breasts:  Examined lying and sitting without masses, retractions, discharge or axillary adenopathy. Pelvic:  Ext/BUS/vagina with generalized atrophic changes  Adnexa  Without masses or tenderness    Anus and perineum  Normal   Rectovaginal  Normal sphincter tone without palpated masses or tenderness.    Assessment/Plan:  68 y.o. G0P0 female for followup.   1. Atrophic genital changes/post menopausal. Status post TAH LSO subsequent RSO for endometriosis. Without significant hot flushes or night sweats. Does have vaginal dryness/dyspareunia and uses OTC products for this. Is not interested in prescription products. Will continue to monitor followup as needed. 2. Osteopenia. DEXA 04/2010 T score -1.2 FRAX 8.4%/0.7%. Repeat DEXA this year. Increase calcium vitamin D  reviewed. 3. Pap smear 2012. No Pap smear done today. No history of abnormal Pap smears. Over the age of 11. Status post hysterectomy for benign indications. We both reviewed current screening guidelines are comfortable with stop screening. 4. Mammography 04/2013. Continue with annual mammography. SBE monthly review. 5. Colonoscopy 7-8 years ago. Planned repeat interval 10 years per her history. 6. Health maintenance. No routine blood work done as this is reportedly done through her primary physician's office. Followup one year, sooner as needed.   Note: This document was prepared with digital dictation and possible smart phrase technology. Any transcriptional errors that result from this process are unintentional.   Anastasio Auerbach MD, 9:04 AM 05/19/2013

## 2013-05-20 LAB — URINALYSIS W MICROSCOPIC + REFLEX CULTURE
BACTERIA UA: NONE SEEN
Bilirubin Urine: NEGATIVE
CRYSTALS: NONE SEEN
Casts: NONE SEEN
Glucose, UA: NEGATIVE mg/dL
Hgb urine dipstick: NEGATIVE
Ketones, ur: NEGATIVE mg/dL
LEUKOCYTES UA: NEGATIVE
NITRITE: NEGATIVE
Protein, ur: NEGATIVE mg/dL
SPECIFIC GRAVITY, URINE: 1.014 (ref 1.005–1.030)
SQUAMOUS EPITHELIAL / LPF: NONE SEEN
UROBILINOGEN UA: 0.2 mg/dL (ref 0.0–1.0)
pH: 6 (ref 5.0–8.0)

## 2013-06-10 ENCOUNTER — Ambulatory Visit (INDEPENDENT_AMBULATORY_CARE_PROVIDER_SITE_OTHER): Payer: Medicare Other

## 2013-06-10 ENCOUNTER — Other Ambulatory Visit: Payer: Self-pay | Admitting: Gynecology

## 2013-06-10 DIAGNOSIS — Z780 Asymptomatic menopausal state: Secondary | ICD-10-CM

## 2013-06-10 DIAGNOSIS — M8580 Other specified disorders of bone density and structure, unspecified site: Secondary | ICD-10-CM

## 2013-09-27 ENCOUNTER — Encounter: Payer: Self-pay | Admitting: Podiatry

## 2013-09-27 ENCOUNTER — Ambulatory Visit (INDEPENDENT_AMBULATORY_CARE_PROVIDER_SITE_OTHER): Payer: Medicare Other | Admitting: Podiatry

## 2013-09-27 DIAGNOSIS — L84 Corns and callosities: Secondary | ICD-10-CM

## 2013-09-27 NOTE — Progress Notes (Signed)
Subjective:     Patient ID: Diana Cisneros, female   DOB: 1945-04-23, 68 y.o.   MRN: [id number]  HPI patient presents with calluses on the plantar aspect of both feet   Review of Systems     Objective:   Physical Exam Neurovascular status intact with keratotic lesion plantar aspect of both feet that's painful when pressed    Assessment:     Chronic keratotic lesion of both feet    Plan:     Debridement lesions on both feet with no bleeding noted

## 2013-12-16 ENCOUNTER — Other Ambulatory Visit: Payer: Self-pay | Admitting: Obstetrics and Gynecology

## 2014-05-30 ENCOUNTER — Other Ambulatory Visit: Payer: Self-pay

## 2014-05-30 ENCOUNTER — Ambulatory Visit (INDEPENDENT_AMBULATORY_CARE_PROVIDER_SITE_OTHER): Payer: Medicare Other | Admitting: Gynecology

## 2014-05-30 ENCOUNTER — Encounter: Payer: Self-pay | Admitting: Gynecology

## 2014-05-30 VITALS — BP 122/78 | Ht 62.0 in | Wt 139.0 lb

## 2014-05-30 DIAGNOSIS — N952 Postmenopausal atrophic vaginitis: Secondary | ICD-10-CM

## 2014-05-30 DIAGNOSIS — Z01419 Encounter for gynecological examination (general) (routine) without abnormal findings: Secondary | ICD-10-CM | POA: Diagnosis not present

## 2014-05-30 DIAGNOSIS — Z1231 Encounter for screening mammogram for malignant neoplasm of breast: Secondary | ICD-10-CM

## 2014-05-30 NOTE — Patient Instructions (Signed)
You may obtain a copy of any labs that were done today by logging onto MyChart as outlined in the instructions provided with your AVS (after visit summary). The office will not call with normal lab results but certainly if there are any significant abnormalities then we will contact you.   Health Maintenance, Female A healthy lifestyle and preventative care can promote health and wellness.  Maintain regular health, dental, and eye exams.  Eat a healthy diet. Foods like vegetables, fruits, whole grains, low-fat dairy products, and lean protein foods contain the nutrients you need without too many calories. Decrease your intake of foods high in solid fats, added sugars, and salt. Get information about a proper diet from your caregiver, if necessary.  Regular physical exercise is one of the most important things you can do for your health. Most adults should get at least 150 minutes of moderate-intensity exercise (any activity that increases your heart rate and causes you to sweat) each week. In addition, most adults need muscle-strengthening exercises on 2 or more days a week.   Maintain a healthy weight. The body mass index (BMI) is a screening tool to identify possible weight problems. It provides an estimate of body fat based on height and weight. Your caregiver can help determine your BMI, and can help you achieve or maintain a healthy weight. For adults 20 years and older:  A BMI below 18.5 is considered underweight.  A BMI of 18.5 to 24.9 is normal.  A BMI of 25 to 29.9 is considered overweight.  A BMI of 30 and above is considered obese.  Maintain normal blood lipids and cholesterol by exercising and minimizing your intake of saturated fat. Eat a balanced diet with plenty of fruits and vegetables. Blood tests for lipids and cholesterol should begin at age 66 and be repeated every 5 years. If your lipid or cholesterol levels are high, you are over 50, or you are a high risk for heart  disease, you may need your cholesterol levels checked more frequently.Ongoing high lipid and cholesterol levels should be treated with medicines if diet and exercise are not effective.  If you smoke, find out from your caregiver how to quit. If you do not use tobacco, do not start.  Lung cancer screening is recommended for adults aged 18 80 years who are at high risk for developing lung cancer because of a history of smoking. Yearly low-dose computed tomography (CT) is recommended for people who have at least a 30-pack-year history of smoking and are a current smoker or have quit within the past 15 years. A pack year of smoking is smoking an average of 1 pack of cigarettes a day for 1 year (for example: 1 pack a day for 30 years or 2 packs a day for 15 years). Yearly screening should continue until the smoker has stopped smoking for at least 15 years. Yearly screening should also be stopped for people who develop a health problem that would prevent them from having lung cancer treatment.  If you are pregnant, do not drink alcohol. If you are breastfeeding, be very cautious about drinking alcohol. If you are not pregnant and choose to drink alcohol, do not exceed 1 drink per day. One drink is considered to be 12 ounces (355 mL) of beer, 5 ounces (148 mL) of wine, or 1.5 ounces (44 mL) of liquor.  Avoid use of street drugs. Do not share needles with anyone. Ask for help if you need support or instructions about stopping  the use of drugs.  High blood pressure causes heart disease and increases the risk of stroke. Blood pressure should be checked at least every 1 to 2 years. Ongoing high blood pressure should be treated with medicines, if weight loss and exercise are not effective.  If you are 59 to 69 years old, ask your caregiver if you should take aspirin to prevent strokes.  Diabetes screening involves taking a blood sample to check your fasting blood sugar level. This should be done once every 3  years, after age 91, if you are within normal weight and without risk factors for diabetes. Testing should be considered at a younger age or be carried out more frequently if you are overweight and have at least 1 risk factor for diabetes.  Breast cancer screening is essential preventative care for women. You should practice "breast self-awareness." This means understanding the normal appearance and feel of your breasts and may include breast self-examination. Any changes detected, no matter how small, should be reported to a caregiver. Women in their 66s and 30s should have a clinical breast exam (CBE) by a caregiver as part of a regular health exam every 1 to 3 years. After age 101, women should have a CBE every year. Starting at age 100, women should consider having a mammogram (breast X-ray) every year. Women who have a family history of breast cancer should talk to their caregiver about genetic screening. Women at a high risk of breast cancer should talk to their caregiver about having an MRI and a mammogram every year.  Breast cancer gene (BRCA)-related cancer risk assessment is recommended for women who have family members with BRCA-related cancers. BRCA-related cancers include breast, ovarian, tubal, and peritoneal cancers. Having family members with these cancers may be associated with an increased risk for harmful changes (mutations) in the breast cancer genes BRCA1 and BRCA2. Results of the assessment will determine the need for genetic counseling and BRCA1 and BRCA2 testing.  The Pap test is a screening test for cervical cancer. Women should have a Pap test starting at age 57. Between ages 25 and 35, Pap tests should be repeated every 2 years. Beginning at age 37, you should have a Pap test every 3 years as long as the past 3 Pap tests have been normal. If you had a hysterectomy for a problem that was not cancer or a condition that could lead to cancer, then you no longer need Pap tests. If you are  between ages 50 and 76, and you have had normal Pap tests going back 10 years, you no longer need Pap tests. If you have had past treatment for cervical cancer or a condition that could lead to cancer, you need Pap tests and screening for cancer for at least 20 years after your treatment. If Pap tests have been discontinued, risk factors (such as a new sexual partner) need to be reassessed to determine if screening should be resumed. Some women have medical problems that increase the chance of getting cervical cancer. In these cases, your caregiver may recommend more frequent screening and Pap tests.  The human papillomavirus (HPV) test is an additional test that may be used for cervical cancer screening. The HPV test looks for the virus that can cause the cell changes on the cervix. The cells collected during the Pap test can be tested for HPV. The HPV test could be used to screen women aged 44 years and older, and should be used in women of any age  who have unclear Pap test results. After the age of 55, women should have HPV testing at the same frequency as a Pap test.  Colorectal cancer can be detected and often prevented. Most routine colorectal cancer screening begins at the age of 44 and continues through age 20. However, your caregiver may recommend screening at an earlier age if you have risk factors for colon cancer. On a yearly basis, your caregiver may provide home test kits to check for hidden blood in the stool. Use of a small camera at the end of a tube, to directly examine the colon (sigmoidoscopy or colonoscopy), can detect the earliest forms of colorectal cancer. Talk to your caregiver about this at age 86, when routine screening begins. Direct examination of the colon should be repeated every 5 to 10 years through age 13, unless early forms of pre-cancerous polyps or small growths are found.  Hepatitis C blood testing is recommended for all people born from [birth year] through 1965 and any  individual with known risks for hepatitis C.  Practice safe sex. Use condoms and avoid high-risk sexual practices to reduce the spread of sexually transmitted infections (STIs). Sexually active women aged 36 and younger should be checked for Chlamydia, which is a common sexually transmitted infection. Older women with new or multiple partners should also be tested for Chlamydia. Testing for other STIs is recommended if you are sexually active and at increased risk.  Osteoporosis is a disease in which the bones lose minerals and strength with aging. This can result in serious bone fractures. The risk of osteoporosis can be identified using a bone density scan. Women ages 20 and over and women at risk for fractures or osteoporosis should discuss screening with their caregivers. Ask your caregiver whether you should be taking a calcium supplement or vitamin D to reduce the rate of osteoporosis.  Menopause can be associated with physical symptoms and risks. Hormone replacement therapy is available to decrease symptoms and risks. You should talk to your caregiver about whether hormone replacement therapy is right for you.  Use sunscreen. Apply sunscreen liberally and repeatedly throughout the day. You should seek shade when your shadow is shorter than you. Protect yourself by wearing long sleeves, pants, a wide-brimmed hat, and sunglasses year round, whenever you are outdoors.  Notify your caregiver of new moles or changes in moles, especially if there is a change in shape or color. Also notify your caregiver if a mole is larger than the size of a pencil eraser.  Stay current with your immunizations. Document Released: 07/30/2010 Document Revised: 05/11/2012 Document Reviewed: 07/30/2010 [facility] Patient Information 2014 [employer].

## 2014-05-30 NOTE — Progress Notes (Signed)
Diana Cisneros [birth year] [id number]        69 y.o.  G0P0 for breast and pelvic exam.  Past medical history,surgical history, problem list, medications, allergies, family history and social history were all reviewed and documented as reviewed in the EPIC chart.  ROS:  Performed with pertinent positives and negatives included in the history, assessment and plan.   Additional significant findings :  none   Exam: Kim Counsellor Vitals:   05/30/14 0907  BP: 122/78  Height: 5\' 2"  (1.575 m)  Weight: 139 lb (63.05 kg)   General appearance:  Normal affect, orientation and appearance. Skin: Grossly normal HEENT: Without gross lesions.  No cervical or supraclavicular adenopathy. Thyroid normal.  Lungs:  Clear without wheezing, rales or rhonchi Cardiac: RR, without RMG Abdominal:  Soft, nontender, without masses, guarding, rebound, organomegaly or hernia Breasts:  Examined lying and sitting without masses, retractions, discharge or axillary adenopathy. Pelvic:  Ext/BUS/vagina with atrophic changes  Adnexa  Without masses or tenderness    Anus and perineum  Normal   Rectovaginal  Normal sphincter tone without palpated masses or tenderness.    Assessment/Plan:  69 y.o. G0P0 female for breast and pelvic exam.   1. Postmenopausal/atrophic genital changes.  Status post TAH LSO and subsequent RSO for endometriosis. Doing well without significant hot flushes, night sweats or vaginal dryness. Continue to monitor and report any issues. 2. History of osteopenia with DEXA 2012 T score -1.2. FRAX 8%/0.7%. Follow up DEXA 2015 normal. Plan repeat at 5 year interval. Increase calcium vitamin D reviewed. 3. Mammogram due now patient knows to call and schedule. SBE monthly reviewed. 4. Colonoscopy 8-9 years ago. Planned repeat interval 10 years. 5. Pap smear 2012. No Pap smear done today. No history of abnormal Pap smears.  Status post hysterectomy for benign indications.  Over the age of 7. Per current  screening guidelines we both feel comfortable stop screening. 6. Health maintenance. No routine lab work done as this is done at her primary physician's office. Follow up 1 year, sooner as needed.    Anastasio Auerbach MD, 9:28 AM 05/30/2014

## 2014-06-01 ENCOUNTER — Ambulatory Visit
Admission: RE | Admit: 2014-06-01 | Discharge: 2014-06-01 | Disposition: A | Discharge status: Home / Self Care | Payer: Self-pay | Source: Ambulatory Visit

## 2014-06-01 DIAGNOSIS — Z1231 Encounter for screening mammogram for malignant neoplasm of breast: Secondary | ICD-10-CM

## 2014-08-29 ENCOUNTER — Encounter: Payer: Self-pay | Admitting: Podiatry

## 2014-08-29 ENCOUNTER — Ambulatory Visit (INDEPENDENT_AMBULATORY_CARE_PROVIDER_SITE_OTHER): Payer: Medicare Other | Admitting: Podiatry

## 2014-08-29 VITALS — BP 119/69 | HR 70 | Resp 15

## 2014-08-29 DIAGNOSIS — L84 Corns and callosities: Secondary | ICD-10-CM | POA: Diagnosis not present

## 2014-08-30 NOTE — Progress Notes (Signed)
Subjective:     Patient ID: Diana Cisneros, female   DOB: 1945-11-20, 69 y.o.   MRN: [id number]  HPI patient presents with lesions on the plantar of the first metatarsal and second metatarsal both feet with pain   Review of Systems     Objective:   Physical Exam Neurovascular status unchanged with keratotic lesions 2 of both feet    Assessment:     Lesion secondary to pressure    Plan:     Debride lesions on both feet with no iatrogenic bleeding noted

## 2014-10-21 ENCOUNTER — Ambulatory Visit (INDEPENDENT_AMBULATORY_CARE_PROVIDER_SITE_OTHER): Payer: Medicare Other | Admitting: Podiatry

## 2014-10-21 ENCOUNTER — Encounter: Payer: Self-pay | Admitting: Podiatry

## 2014-10-21 DIAGNOSIS — M779 Enthesopathy, unspecified: Secondary | ICD-10-CM | POA: Diagnosis not present

## 2014-10-21 DIAGNOSIS — L84 Corns and callosities: Secondary | ICD-10-CM | POA: Diagnosis not present

## 2014-10-21 MED ORDER — TRIAMCINOLONE ACETONIDE 10 MG/ML IJ SUSP
10.0000 mg | Freq: Once | INTRAMUSCULAR | Status: AC
  Administered 2014-10-21: 10 mg

## 2014-10-23 NOTE — Progress Notes (Signed)
Subjective:     Patient ID: Diana Cisneros, female   DOB: 1945/04/27, 69 y.o.   MRN: [id number]  HPI patient presents with a new corn between the third and fourth toes on her left foot that's inflamed and painful and does have keratotic lesion bilateral   Review of Systems     Objective:   Physical Exam Inflammatory capsulitis interphalangeal joint third toe left with pain along with keratotic lesion formation    Assessment:     Capsulitis third digit left interphalangeal joint along with corn callus formation    Plan:     Careful injection administered 1 mg dexamethasone 1 mg Kenalog 3 mg Xylocaine and debridement accomplished of lesions with no iatrogenic bleeding noted

## 2014-10-26 ENCOUNTER — Other Ambulatory Visit: Payer: Self-pay | Admitting: Gastroenterology

## 2014-12-05 ENCOUNTER — Other Ambulatory Visit: Payer: Self-pay | Admitting: Gastroenterology

## 2014-12-14 ENCOUNTER — Encounter (HOSPITAL_COMMUNITY): Payer: Self-pay | Admitting: *Deleted

## 2014-12-15 ENCOUNTER — Ambulatory Visit (INDEPENDENT_AMBULATORY_CARE_PROVIDER_SITE_OTHER): Payer: Medicare Other | Admitting: Podiatry

## 2014-12-15 ENCOUNTER — Encounter: Payer: Self-pay | Admitting: Podiatry

## 2014-12-15 ENCOUNTER — Ambulatory Visit: Payer: Medicare Other | Admitting: Podiatry

## 2014-12-15 VITALS — BP 122/61 | HR 76 | Resp 16

## 2014-12-15 DIAGNOSIS — M779 Enthesopathy, unspecified: Secondary | ICD-10-CM | POA: Diagnosis not present

## 2014-12-15 DIAGNOSIS — L84 Corns and callosities: Secondary | ICD-10-CM

## 2014-12-15 MED ORDER — TRIAMCINOLONE ACETONIDE 10 MG/ML IJ SUSP
10.0000 mg | Freq: Once | INTRAMUSCULAR | Status: AC
  Administered 2014-12-15: 10 mg

## 2014-12-16 NOTE — Progress Notes (Signed)
Subjective:     Patient ID: Diana Cisneros, female   DOB: May 14, 1945, 69 y.o.   MRN: [id number]  HPI patient states I developed this new corn on the side of my big toe like it's inflamed and makes it hard to wear shoe gear comfortably and I have several other cords that become bothersome   Review of Systems     Objective:   Physical Exam Neurovascular status intact with inflammatory interphalangeal joint capsule left hallux lateral side with keratotic lesion and fluid buildup    Assessment:     Inflammatory capsulitis with keratotic lesion formation    Plan:     Injected the interphalangeal joint to milligrams dexamethasone Kenalog 5 mg Xylocaine and debrided all lesions on both feet

## 2014-12-27 ENCOUNTER — Ambulatory Visit (HOSPITAL_COMMUNITY): Payer: Medicare Other | Admitting: Certified Registered Nurse Anesthetist

## 2014-12-27 ENCOUNTER — Ambulatory Visit (HOSPITAL_COMMUNITY)
Admission: RE | Admit: 2014-12-27 | Discharge: 2014-12-27 | Disposition: A | Discharge status: Home / Self Care | Payer: Medicare Other | Source: Ambulatory Visit | Attending: Gastroenterology | Admitting: Gastroenterology

## 2014-12-27 ENCOUNTER — Encounter (HOSPITAL_COMMUNITY)
Admission: RE | Disposition: A | Discharge status: Home / Self Care | Payer: Self-pay | Source: Ambulatory Visit | Attending: Gastroenterology

## 2014-12-27 ENCOUNTER — Encounter (HOSPITAL_COMMUNITY): Payer: Self-pay

## 2014-12-27 DIAGNOSIS — Z87891 Personal history of nicotine dependence: Secondary | ICD-10-CM | POA: Diagnosis not present

## 2014-12-27 DIAGNOSIS — Z1211 Encounter for screening for malignant neoplasm of colon: Secondary | ICD-10-CM | POA: Insufficient documentation

## 2014-12-27 DIAGNOSIS — M1990 Unspecified osteoarthritis, unspecified site: Secondary | ICD-10-CM | POA: Diagnosis not present

## 2014-12-27 DIAGNOSIS — E7800 Pure hypercholesterolemia, unspecified: Secondary | ICD-10-CM | POA: Diagnosis not present

## 2014-12-27 HISTORY — PX: COLONOSCOPY WITH PROPOFOL: SHX5780

## 2014-12-27 SURGERY — COLONOSCOPY WITH PROPOFOL
Anesthesia: Monitor Anesthesia Care

## 2014-12-27 MED ORDER — ONDANSETRON HCL 4 MG/2ML IJ SOLN
INTRAMUSCULAR | Status: AC
  Filled 2014-12-27: qty 2

## 2014-12-27 MED ORDER — SODIUM CHLORIDE 0.9 % IV SOLN: INTRAVENOUS | Status: DC

## 2014-12-27 MED ORDER — ONDANSETRON HCL 4 MG/2ML IJ SOLN
INTRAMUSCULAR | Status: DC | PRN
Start: 2014-12-27 — End: 2014-12-27
  Administered 2014-12-27: 4 mg via INTRAVENOUS

## 2014-12-27 MED ORDER — LIDOCAINE HCL (CARDIAC) 20 MG/ML IV SOLN
INTRAVENOUS | Status: AC
  Filled 2014-12-27: qty 5

## 2014-12-27 MED ORDER — PROPOFOL 10 MG/ML IV BOLUS
INTRAVENOUS | Status: DC | PRN
  Administered 2014-12-27 (×2): 10 mg via INTRAVENOUS
  Administered 2014-12-27: 30 mg via INTRAVENOUS
  Administered 2014-12-27: 20 mg via INTRAVENOUS

## 2014-12-27 MED ORDER — PROPOFOL 10 MG/ML IV BOLUS
INTRAVENOUS | Status: AC
  Filled 2014-12-27: qty 60

## 2014-12-27 MED ORDER — LACTATED RINGERS IV SOLN
INTRAVENOUS | Status: DC
  Administered 2014-12-27: 1000 mL via INTRAVENOUS

## 2014-12-27 MED ORDER — PROPOFOL 500 MG/50ML IV EMUL
INTRAVENOUS | Status: DC | PRN
  Administered 2014-12-27: 50 ug/kg/min via INTRAVENOUS

## 2014-12-27 MED ORDER — LIDOCAINE HCL (CARDIAC) 20 MG/ML IV SOLN
INTRAVENOUS | Status: DC | PRN
  Administered 2014-12-27: 100 mg via INTRAVENOUS

## 2014-12-27 SURGICAL SUPPLY — 22 items

## 2014-12-27 NOTE — Anesthesia Preprocedure Evaluation (Signed)
Anesthesia Evaluation  Patient identified by MRN, date of birth, ID band Patient awake    Reviewed: Allergy & Precautions, NPO status , Patient's Chart, lab work & pertinent test results  Airway Mallampati: II  TM Distance: >3 FB Neck ROM: Full    Dental no notable dental hx.    Pulmonary former smoker,    Pulmonary exam normal breath sounds clear to auscultation       Cardiovascular negative cardio ROS Normal cardiovascular exam Rhythm:Regular Rate:Normal     Neuro/Psych negative neurological ROS  negative psych ROS   GI/Hepatic negative GI ROS, Neg liver ROS,   Endo/Other  negative endocrine ROS  Renal/GU negative Renal ROS  negative genitourinary   Musculoskeletal  (+) Arthritis ,   Abdominal   Peds negative pediatric ROS (+)  Hematology negative hematology ROS (+)   Anesthesia Other Findings   Reproductive/Obstetrics negative OB ROS                             Anesthesia Physical Anesthesia Plan  ASA: II  Anesthesia Plan: MAC   Post-op Pain Management:    Induction: Intravenous  Airway Management Planned: Natural Airway  Additional Equipment:   Intra-op Plan:   Post-operative Plan:   Informed Consent: I have reviewed the patients History and Physical, chart, labs and discussed the procedure including the risks, benefits and alternatives for the proposed anesthesia with the patient or authorized representative who has indicated his/her understanding and acceptance.   Dental advisory given  Plan Discussed with: CRNA  Anesthesia Plan Comments:         Anesthesia Quick Evaluation

## 2014-12-27 NOTE — Op Note (Signed)
Procedure: Screening colonoscopy. Normal screening colonoscopy performed on 12/26/2004  Endoscopist: Earle Gell  Premedication: Propofol administered by anesthesia  Procedure: The patient was placed in the left lateral decubitus position. Anal inspection and digital rectal exam were normal. The Pentax pediatric colonoscope was introduced into the rectum and advanced to the cecum. A normal-appearing appendiceal orifice was identified. A normal-appearing ileocecal valve was intubated and the terminal ileum inspected. Colonic preparation for the exam today was good. Withdrawal time was 9 minutes  Rectum. Normal. Retroflexed view of the distal rectum was normal  Sigmoid colon and descending colon. Normal  Splenic flexure. Normal  Transverse colon. Normal  Hepatic flexure. Normal  Ascending colon. Normal  Cecum and ileocecal valve. Normal  Terminal ileum. Normal  Assessment: Normal screening colonoscopy.

## 2014-12-27 NOTE — Discharge Instructions (Signed)

## 2014-12-27 NOTE — Transfer of Care (Signed)
Immediate Anesthesia Transfer of Care Note  Patient: Diana Cisneros  Procedure(s) Performed: Procedure(s): COLONOSCOPY WITH PROPOFOL (N/A)  Patient Location: ENDO  Anesthesia Type:MAC  Level of Consciousness:  sedated, patient cooperative and responds to stimulation  Airway & Oxygen Therapy:Patient Spontanous Breathing and Patient connected to face mask oxgen  Post-op Assessment:  Report given to ENDO RN and Post -op Vital signs reviewed and stable  Post vital signs:  Reviewed and stable  Last Vitals:  Filed Vitals:   12/27/14 0910  BP: 139/80  Pulse: 67  Temp: 36.7 C  Resp: 18    Complications: No apparent anesthesia complications

## 2014-12-27 NOTE — H&P (Signed)
  Procedure: Screening colonoscopy. 12/26/2004 normal screening colonoscopy performed  History: The patient is a 69 year old female born 09/14/1945. She is scheduled to undergo a screening colonoscopy today.  Past medical history: Appendectomy. Hysterectomy. Oophorectomy. Arthroscopic knee surgery. Hypercholesterolemia.  Medication allergies: Morphine caused itching. Aleve caused rash.  Exam: The patient is alert and lying comfortably on the endoscopy stretcher. Abdomen is soft and nontender to palpation. Lungs are clear to auscultation. Cardiac exam reveals a regular rhythm.  Plan: Proceed with screening colonoscopy

## 2014-12-27 NOTE — Anesthesia Postprocedure Evaluation (Signed)
Anesthesia Post Note  Patient: Diana Cisneros  Procedure(s) Performed: Procedure(s) (LRB): COLONOSCOPY WITH PROPOFOL (N/A)  Patient location during evaluation: PACU Anesthesia Type: MAC Level of consciousness: awake and alert Pain management: pain level controlled Vital Signs Assessment: post-procedure vital signs reviewed and stable Respiratory status: spontaneous breathing, nonlabored ventilation, respiratory function stable and patient connected to nasal cannula oxygen Cardiovascular status: stable and blood pressure returned to baseline Anesthetic complications: no    Last Vitals:  Filed Vitals:   12/27/14 1140 12/27/14 1145  BP: 157/85 164/84  Pulse: 57 61  Temp:    Resp: 15 13    Last Pain: There were no vitals filed for this visit.               Wilbur Labuda J

## 2014-12-28 ENCOUNTER — Encounter (HOSPITAL_COMMUNITY): Payer: Self-pay | Admitting: Gastroenterology

## 2015-01-05 ENCOUNTER — Other Ambulatory Visit: Payer: Self-pay | Admitting: Gynecology

## 2015-01-05 NOTE — Telephone Encounter (Signed)
Last filled Nov. 2015

## 2015-02-16 ENCOUNTER — Ambulatory Visit (INDEPENDENT_AMBULATORY_CARE_PROVIDER_SITE_OTHER): Payer: Medicare Other | Admitting: Podiatry

## 2015-02-16 ENCOUNTER — Encounter: Payer: Self-pay | Admitting: Podiatry

## 2015-02-16 ENCOUNTER — Ambulatory Visit: Payer: Medicare Other | Admitting: Podiatry

## 2015-02-16 DIAGNOSIS — L84 Corns and callosities: Secondary | ICD-10-CM

## 2015-02-16 NOTE — Progress Notes (Signed)
Subjective:     Patient ID: Diana Cisneros, female   DOB: 1945-06-06, 70 y.o.   MRN: [id number]  HPI patient presents with multiple lesions plantar aspect both feet   Review of Systems     Objective:   Physical Exam Neurovascular status intact keratotic lesions plantar    Assessment:     Chronic lesions plantar aspect both feet    Plan:     Debride lesions plantar aspect both feet with no iatrogenic bleeding noted

## 2015-03-13 ENCOUNTER — Encounter: Payer: Self-pay | Admitting: Podiatry

## 2015-03-13 ENCOUNTER — Ambulatory Visit (INDEPENDENT_AMBULATORY_CARE_PROVIDER_SITE_OTHER): Payer: Medicare Other | Admitting: Podiatry

## 2015-03-13 VITALS — BP 129/70 | HR 74 | Resp 16

## 2015-03-13 DIAGNOSIS — L84 Corns and callosities: Secondary | ICD-10-CM | POA: Diagnosis not present

## 2015-03-14 NOTE — Progress Notes (Signed)
Subjective:     Patient ID: Diana Cisneros, female   DOB: May 13, 1945, 70 y.o.   MRN: [id number]  HPI patient states I formed his new corn between my 2 toes left and on the bottom my right I feel some   Review of Systems     Objective:   Physical Exam Neurovascular status unchanged with keratotic lesion between second third toe left and also Lanter right    Assessment:     Chronic porokeratotic lesion    Plan:     Debride lesion with no iatrogenic bleeding noted

## 2015-04-18 ENCOUNTER — Other Ambulatory Visit: Payer: Self-pay

## 2015-04-18 DIAGNOSIS — Z1231 Encounter for screening mammogram for malignant neoplasm of breast: Secondary | ICD-10-CM

## 2015-05-18 ENCOUNTER — Other Ambulatory Visit: Payer: Self-pay | Admitting: Internal Medicine

## 2015-05-18 ENCOUNTER — Ambulatory Visit
Admission: RE | Admit: 2015-05-18 | Discharge: 2015-05-18 | Disposition: A | Discharge status: Home / Self Care | Payer: Medicare Other | Source: Ambulatory Visit | Attending: Internal Medicine | Admitting: Internal Medicine

## 2015-05-18 DIAGNOSIS — R61 Generalized hyperhidrosis: Secondary | ICD-10-CM

## 2015-05-31 ENCOUNTER — Ambulatory Visit (INDEPENDENT_AMBULATORY_CARE_PROVIDER_SITE_OTHER): Payer: Medicare Other | Admitting: Gynecology

## 2015-05-31 ENCOUNTER — Encounter: Payer: Self-pay | Admitting: Gynecology

## 2015-05-31 VITALS — BP 116/64 | Ht 62.0 in | Wt 132.0 lb

## 2015-05-31 DIAGNOSIS — Z01419 Encounter for gynecological examination (general) (routine) without abnormal findings: Secondary | ICD-10-CM | POA: Diagnosis not present

## 2015-05-31 DIAGNOSIS — R1030 Lower abdominal pain, unspecified: Secondary | ICD-10-CM

## 2015-05-31 DIAGNOSIS — R61 Generalized hyperhidrosis: Secondary | ICD-10-CM | POA: Diagnosis not present

## 2015-05-31 DIAGNOSIS — N952 Postmenopausal atrophic vaginitis: Secondary | ICD-10-CM | POA: Diagnosis not present

## 2015-05-31 DIAGNOSIS — M8580 Other specified disorders of bone density and structure, unspecified site: Secondary | ICD-10-CM | POA: Diagnosis not present

## 2015-05-31 NOTE — Patient Instructions (Signed)
Discussed your lower abdominal discomfort with your primary physician. He may want to order further testing such as CT scan.  Discuss your night sweats with your primary physician. He may want to check your thyroid.  You may obtain a copy of any labs that were done today by logging onto MyChart as outlined in the instructions provided with your AVS (after visit summary). The office will not call with normal lab results but certainly if there are any significant abnormalities then we will contact you.   Health Maintenance Adopting a healthy lifestyle and getting preventive care can go a long way to promote health and wellness. Talk with your health care provider about what schedule of regular examinations is right for you. This is a good chance for you to check in with your provider about disease prevention and staying healthy. In between checkups, there are plenty of things you can do on your own. Experts have done a lot of research about which lifestyle changes and preventive measures are most likely to keep you healthy. Ask your health care provider for more information. WEIGHT AND DIET  Eat a healthy diet  Be sure to include plenty of vegetables, fruits, low-fat dairy products, and lean protein.  Do not eat a lot of foods high in solid fats, added sugars, or salt.  Get regular exercise. This is one of the most important things you can do for your health.  Most adults should exercise for at least 150 minutes each week. The exercise should increase your heart rate and make you sweat (moderate-intensity exercise).  Most adults should also do strengthening exercises at least twice a week. This is in addition to the moderate-intensity exercise.  Maintain a healthy weight  Body mass index (BMI) is a measurement that can be used to identify possible weight problems. It estimates body fat based on height and weight. Your health care provider can help determine your BMI and help you achieve or  maintain a healthy weight.  For females 35 years of age and older:   A BMI below 18.5 is considered underweight.  A BMI of 18.5 to 24.9 is normal.  A BMI of 25 to 29.9 is considered overweight.  A BMI of 30 and above is considered obese.  Watch levels of cholesterol and blood lipids  You should start having your blood tested for lipids and cholesterol at 70 years of age, then have this test every 5 years.  You may need to have your cholesterol levels checked more often if:  Your lipid or cholesterol levels are high.  You are older than 70 years of age.  You are at high risk for heart disease.  CANCER SCREENING   Lung Cancer  Lung cancer screening is recommended for adults 30-81 years old who are at high risk for lung cancer because of a history of smoking.  A yearly low-dose CT scan of the lungs is recommended for people who:  Currently smoke.  Have quit within the past 15 years.  Have at least a 30-pack-year history of smoking. A pack year is smoking an average of one pack of cigarettes a day for 1 year.  Yearly screening should continue until it has been 15 years since you quit.  Yearly screening should stop if you develop a health problem that would prevent you from having lung cancer treatment.  Breast Cancer  Practice breast self-awareness. This means understanding how your breasts normally appear and feel.  It also means doing regular breast self-exams.  Let your health care provider know about any changes, no matter how small.  If you are in your 20s or 30s, you should have a clinical breast exam (CBE) by a health care provider every 1-3 years as part of a regular health exam.  If you are 14 or older, have a CBE every year. Also consider having a breast X-ray (mammogram) every year.  If you have a family history of breast cancer, talk to your health care provider about genetic screening.  If you are at high risk for breast cancer, talk to your health care  provider about having an MRI and a mammogram every year.  Breast cancer gene (BRCA) assessment is recommended for women who have family members with BRCA-related cancers. BRCA-related cancers include:  Breast.  Ovarian.  Tubal.  Peritoneal cancers.  Results of the assessment will determine the need for genetic counseling and BRCA1 and BRCA2 testing. Cervical Cancer Routine pelvic examinations to screen for cervical cancer are no longer recommended for nonpregnant women who are considered low risk for cancer of the pelvic organs (ovaries, uterus, and vagina) and who do not have symptoms. A pelvic examination may be necessary if you have symptoms including those associated with pelvic infections. Ask your health care provider if a screening pelvic exam is right for you.   The Pap test is the screening test for cervical cancer for women who are considered at risk.  If you had a hysterectomy for a problem that was not cancer or a condition that could lead to cancer, then you no longer need Pap tests.  If you are older than 65 years, and you have had normal Pap tests for the past 10 years, you no longer need to have Pap tests.  If you have had past treatment for cervical cancer or a condition that could lead to cancer, you need Pap tests and screening for cancer for at least 20 years after your treatment.  If you no longer get a Pap test, assess your risk factors if they change (such as having a new sexual partner). This can affect whether you should start being screened again.  Some women have medical problems that increase their chance of getting cervical cancer. If this is the case for you, your health care provider may recommend more frequent screening and Pap tests.  The human papillomavirus (HPV) test is another test that may be used for cervical cancer screening. The HPV test looks for the virus that can cause cell changes in the cervix. The cells collected during the Pap test can be  tested for HPV.  The HPV test can be used to screen women 58 years of age and older. Getting tested for HPV can extend the interval between normal Pap tests from three to five years.  An HPV test also should be used to screen women of any age who have unclear Pap test results.  After 70 years of age, women should have HPV testing as often as Pap tests.  Colorectal Cancer  This type of cancer can be detected and often prevented.  Routine colorectal cancer screening usually begins at 70 years of age and continues through 70 years of age.  Your health care provider may recommend screening at an earlier age if you have risk factors for colon cancer.  Your health care provider may also recommend using home test kits to check for hidden blood in the stool.  A small camera at the end of a tube can be used  to examine your colon directly (sigmoidoscopy or colonoscopy). This is done to check for the earliest forms of colorectal cancer.  Routine screening usually begins at age 61.  Direct examination of the colon should be repeated every 5-10 years through 70 years of age. However, you may need to be screened more often if early forms of precancerous polyps or small growths are found. Skin Cancer  Check your skin from head to toe regularly.  Tell your health care provider about any new moles or changes in moles, especially if there is a change in a mole's shape or color.  Also tell your health care provider if you have a mole that is larger than the size of a pencil eraser.  Always use sunscreen. Apply sunscreen liberally and repeatedly throughout the day.  Protect yourself by wearing long sleeves, pants, a wide-brimmed hat, and sunglasses whenever you are outside. HEART DISEASE, DIABETES, AND HIGH BLOOD PRESSURE   Have your blood pressure checked at least every 1-2 years. High blood pressure causes heart disease and increases the risk of stroke.  If you are between 88 years and 68 years  old, ask your health care provider if you should take aspirin to prevent strokes.  Have regular diabetes screenings. This involves taking a blood sample to check your fasting blood sugar level.  If you are at a normal weight and have a low risk for diabetes, have this test once every three years after 70 years of age.  If you are overweight and have a high risk for diabetes, consider being tested at a younger age or more often. PREVENTING INFECTION  Hepatitis B  If you have a higher risk for hepatitis B, you should be screened for this virus. You are considered at high risk for hepatitis B if:  You were born in a country where hepatitis B is common. Ask your health care provider which countries are considered high risk.  Your parents were born in a high-risk country, and you have not been immunized against hepatitis B (hepatitis B vaccine).  You have HIV or AIDS.  You use needles to inject street drugs.  You live with someone who has hepatitis B.  You have had sex with someone who has hepatitis B.  You get hemodialysis treatment.  You take certain medicines for conditions, including cancer, organ transplantation, and autoimmune conditions. Hepatitis C  Blood testing is recommended for:  Everyone born from [birth year] through 1965.  Anyone with known risk factors for hepatitis C. Sexually transmitted infections (STIs)  You should be screened for sexually transmitted infections (STIs) including gonorrhea and chlamydia if:  You are sexually active and are younger than 70 years of age.  You are older than 70 years of age and your health care provider tells you that you are at risk for this type of infection.  Your sexual activity has changed since you were last screened and you are at an increased risk for chlamydia or gonorrhea. Ask your health care provider if you are at risk.  If you do not have HIV, but are at risk, it may be recommended that you take a prescription medicine  daily to prevent HIV infection. This is called pre-exposure prophylaxis (PrEP). You are considered at risk if:  You are sexually active and do not regularly use condoms or know the HIV status of your partner(s).  You take drugs by injection.  You are sexually active with a partner who has HIV. Talk with your health care provider  about whether you are at high risk of being infected with HIV. If you choose to begin PrEP, you should first be tested for HIV. You should then be tested every 3 months for as long as you are taking PrEP.  PREGNANCY   If you are premenopausal and you may become pregnant, ask your health care provider about preconception counseling.  If you may become pregnant, take 400 to 800 micrograms (mcg) of folic acid every day.  If you want to prevent pregnancy, talk to your health care provider about birth control (contraception). OSTEOPOROSIS AND MENOPAUSE   Osteoporosis is a disease in which the bones lose minerals and strength with aging. This can result in serious bone fractures. Your risk for osteoporosis can be identified using a bone density scan.  If you are 92 years of age or older, or if you are at risk for osteoporosis and fractures, ask your health care provider if you should be screened.  Ask your health care provider whether you should take a calcium or vitamin D supplement to lower your risk for osteoporosis.  Menopause may have certain physical symptoms and risks.  Hormone replacement therapy may reduce some of these symptoms and risks. Talk to your health care provider about whether hormone replacement therapy is right for you.  HOME CARE INSTRUCTIONS   Schedule regular health, dental, and eye exams.  Stay current with your immunizations.   Do not use any tobacco products including cigarettes, chewing tobacco, or electronic cigarettes.  If you are pregnant, do not drink alcohol.  If you are breastfeeding, limit how much and how often you drink  alcohol.  Limit alcohol intake to no more than 1 drink per day for nonpregnant women. One drink equals 12 ounces of beer, 5 ounces of wine, or 1 ounces of hard liquor.  Do not use street drugs.  Do not share needles.  Ask your health care provider for help if you need support or information about quitting drugs.  Tell your health care provider if you often feel depressed.  Tell your health care provider if you have ever been abused or do not feel safe at home. Document Released: 07/30/2010 Document Revised: 05/31/2013 Document Reviewed: 12/16/2012 [facility] Patient Information 2015 [place], [place]. This information is not intended to replace advice given to you by your health care provider. Make sure you discuss any questions you have with your health care provider.

## 2015-05-31 NOTE — Progress Notes (Signed)
    Diana Cisneros 08-Jul-1945 [id number]        70 y.o.  G0P0  for breast and pelvic exam.  Past medical history,surgical history, problem list, medications, allergies, family history and social history were all reviewed and documented as reviewed in the EPIC chart.  ROS:  Performed with pertinent positives and negatives included in the history, assessment and plan.   Additional significant findings :  #1 night sweats #2 lower abdominal discomfort   Exam: [name] assistant Filed Vitals:   05/31/15 0850  BP: 116/64  Height: 5\' 2"  (1.575 m)  Weight: 132 lb (59.875 kg)   General appearance:  Normal affect, orientation and appearance. Skin: Grossly normal HEENT: Without gross lesions.  No cervical or supraclavicular adenopathy. Thyroid normal.  Lungs:  Clear without wheezing, rales or rhonchi Cardiac: RR, without RMG Abdominal:  Soft, nontender, without masses, guarding, rebound, organomegaly or hernia Breasts:  Examined lying and sitting without masses, retractions, discharge or axillary adenopathy. Pelvic:  Ext/BUS/vagina with atrophic changes  Adnexa without masses or tenderness    Anus and perineum normal   Rectovaginal normal sphincter tone without palpated masses or tenderness.    Assessment/Plan:  70 y.o. G0P0 female for breast and pelvic exam.   1. Lower abdominal discomfort. Patient notes over the last 2 months vague lower abdominal discomfort. No nausea vomiting diarrhea constipation. No urinary symptoms such as frequency dysuria or urgency. Recently had colonoscopy 2017 which was negative. Is status post TAH LSO with subsequent RSO for endometriosis. Exam today is normal.  Reviewed differential with her to include GI or GU possibilities.Patient has appointment to see her primary physician in several weeks. I've recommended she discuss this with him and decide if further testing is needed such as CT scan. Possible follow up with GI. She agrees to discuss this with him.   Check urinalysis today. 2. Night sweats. Patient notes she consistently is waking up at 4 AM with some night sweats. Not occurring at any other time during the day. Reviewed differential with her. As this is only recently started I doubt that it's estrogen deficiency. Did recommend that she try soy based product in the evening to see if this doesn't help with this. I also recommended she discuss this with her primary physician to be evaluated for other sources of night sweats and she agrees to arrange this. 3. History of osteopenia.  DEXA 2012 T score -1.2. Follow up DEXA 2015 normal. Plan 5 year repeat interval. Increase calcium and vitamin D. 4. Mammography scheduled next week. SBE monthly reviewed. 5. Colonoscopy 2017. Was told she will never need another one. 6. Pap smear 2012. No Pap smear done today. No history of abnormal Pap smears. We both agree to stop screening based upon current screening guidelines with age/hysterectomy history. 7. Health maintenance. No routine blood work done as this is done through her primary physician's office. Follow up 1 year, sooner as needed.   Anastasio Auerbach MD, 9:12 AM 05/31/2015

## 2015-06-01 ENCOUNTER — Other Ambulatory Visit: Payer: Self-pay | Admitting: Internal Medicine

## 2015-06-01 ENCOUNTER — Ambulatory Visit
Admission: RE | Admit: 2015-06-01 | Discharge: 2015-06-01 | Disposition: A | Discharge status: Home / Self Care | Payer: Medicare Other | Source: Ambulatory Visit | Attending: Internal Medicine | Admitting: Internal Medicine

## 2015-06-01 DIAGNOSIS — J189 Pneumonia, unspecified organism: Secondary | ICD-10-CM

## 2015-06-01 LAB — URINALYSIS W MICROSCOPIC + REFLEX CULTURE
BACTERIA UA: NONE SEEN [HPF]
BILIRUBIN URINE: NEGATIVE
CASTS: NONE SEEN [LPF]
GLUCOSE, UA: NEGATIVE
Hgb urine dipstick: NEGATIVE
Ketones, ur: NEGATIVE
Leukocytes, UA: NEGATIVE
Nitrite: NEGATIVE
PH: 5.5 (ref 5.0–8.0)
PROTEIN: NEGATIVE
RBC / HPF: NONE SEEN RBC/HPF (ref <=2)
Specific Gravity, Urine: 1.022 (ref 1.001–1.035)
Squamous Epithelial / LPF: NONE SEEN [HPF] (ref <=5)
Yeast: NONE SEEN [HPF]

## 2015-06-02 ENCOUNTER — Ambulatory Visit: Payer: Medicare Other

## 2015-06-02 ENCOUNTER — Other Ambulatory Visit: Payer: Self-pay | Admitting: Gynecology

## 2015-06-02 MED ORDER — SULFAMETHOXAZOLE-TRIMETHOPRIM 800-160 MG PO TABS
1.0000 | ORAL_TABLET | Freq: Two times a day (BID) | ORAL | Status: DC
Start: 2015-06-02 — End: 2016-05-31

## 2015-06-03 LAB — URINE CULTURE: Colony Count: 75000

## 2015-06-05 ENCOUNTER — Ambulatory Visit
Admission: RE | Admit: 2015-06-05 | Discharge: 2015-06-05 | Disposition: A | Discharge status: Home / Self Care | Payer: Medicare Other | Source: Ambulatory Visit

## 2015-06-05 DIAGNOSIS — Z1231 Encounter for screening mammogram for malignant neoplasm of breast: Secondary | ICD-10-CM

## 2015-08-17 ENCOUNTER — Ambulatory Visit (INDEPENDENT_AMBULATORY_CARE_PROVIDER_SITE_OTHER): Payer: Medicare Other | Admitting: Podiatry

## 2015-08-17 ENCOUNTER — Encounter: Payer: Self-pay | Admitting: Podiatry

## 2015-08-17 VITALS — BP 113/82 | HR 90 | Resp 16

## 2015-08-17 DIAGNOSIS — M779 Enthesopathy, unspecified: Secondary | ICD-10-CM | POA: Diagnosis not present

## 2015-08-17 DIAGNOSIS — L84 Corns and callosities: Secondary | ICD-10-CM | POA: Diagnosis not present

## 2015-08-17 MED ORDER — TRIAMCINOLONE ACETONIDE 10 MG/ML IJ SUSP
10.0000 mg | Freq: Once | INTRAMUSCULAR | Status: AC
  Administered 2015-08-17: 10 mg

## 2015-08-17 NOTE — Progress Notes (Signed)
Subjective:     Patient ID: Diana Cisneros, female   DOB: 1945/05/23, 70 y.o.   MRN: [id number]  HPI patient presents with a lot of pain second digit left and second digit right with the left one having inflammation and also keratotic lesions plantar aspect of both feet   Review of Systems     Objective:   Physical Exam Structural deformities of both feet with inflammation of the interphalangeal joint left second toe and keratotic lesions bilateral    Assessment:     Inflammatory capsulitis of the left second digit interphalangeal joint and keratotic lesions    Plan:     Careful injection administered of the left second digit interphalangeal joint to milligrams Dexon some Kenalog 3 mg Xylocaine and debridement accomplished bilateral along with digital pads to the second toe bilateral. Reappoint as needed

## 2015-11-06 ENCOUNTER — Ambulatory Visit (INDEPENDENT_AMBULATORY_CARE_PROVIDER_SITE_OTHER): Payer: Medicare Other | Admitting: Podiatry

## 2015-11-06 ENCOUNTER — Encounter: Payer: Self-pay | Admitting: Podiatry

## 2015-11-06 DIAGNOSIS — L84 Corns and callosities: Secondary | ICD-10-CM | POA: Diagnosis not present

## 2015-11-07 NOTE — Progress Notes (Signed)
Subjective:     Patient ID: Diana Cisneros, female   DOB: 1945-03-10, 70 y.o.   MRN: [id number]  HPI patient presents with callus formation bilateral   Review of Systems     Objective:   Physical Exam Neurovascular status intact with keratotic lesions    Assessment:     Lesion formation bilateral    Plan:     Debride lesions bilateral with no iatrogenic bleeding noted

## 2015-12-29 ENCOUNTER — Ambulatory Visit: Payer: Medicare Other | Admitting: Podiatry

## 2016-01-01 ENCOUNTER — Ambulatory Visit: Payer: Medicare Other | Admitting: Podiatry

## 2016-01-01 ENCOUNTER — Ambulatory Visit (INDEPENDENT_AMBULATORY_CARE_PROVIDER_SITE_OTHER): Payer: Medicare Other | Admitting: Gynecology

## 2016-01-01 VITALS — BP 118/76

## 2016-01-01 DIAGNOSIS — R300 Dysuria: Secondary | ICD-10-CM | POA: Diagnosis not present

## 2016-01-01 DIAGNOSIS — R350 Frequency of micturition: Secondary | ICD-10-CM

## 2016-01-01 LAB — URINALYSIS W MICROSCOPIC + REFLEX CULTURE
Bacteria, UA: NONE SEEN [HPF]
Bilirubin Urine: NEGATIVE
Casts: NONE SEEN [LPF]
Crystals: NONE SEEN [HPF]
GLUCOSE, UA: NEGATIVE
HGB URINE DIPSTICK: NEGATIVE
KETONES UR: NEGATIVE
LEUKOCYTES UA: NEGATIVE
NITRITE: NEGATIVE
PH: 7 (ref 5.0–8.0)
Protein, ur: NEGATIVE
RBC / HPF: NONE SEEN RBC/HPF (ref <=2)
Specific Gravity, Urine: 1.015 (ref 1.001–1.035)
WBC, UA: NONE SEEN WBC/HPF (ref <=5)
YEAST: NONE SEEN [HPF]

## 2016-01-01 MED ORDER — SULFAMETHOXAZOLE-TRIMETHOPRIM 800-160 MG PO TABS: 1.0000 | ORAL_TABLET | Freq: Two times a day (BID) | ORAL | 0 refills | Status: DC

## 2016-01-01 NOTE — Progress Notes (Signed)
    Diana Cisneros 05/05/1945 [id number]        70 y.o.  G0P0 presents with 2 days of increasing frequency, urgency and mild dysuria. No fever or chills. No low back pain. No vaginal discharge irritation or odor. History of low-level UTI with Escherichia coli treated May 2017.  Past medical history,surgical history, problem list, medications, allergies, family history and social history were all reviewed and documented in the EPIC chart.  Directed ROS with pertinent positives and negatives documented in the history of present illness/assessment and plan.  Exam: [name] assistant Vitals:   01/01/16 1128  BP: 118/76   General appearance:  Normal Spine straight without CVA tenderness. Abdomen soft nontender without masses guarding rebound. Pelvic external BUS vagina with atrophic changes. Bimanual without masses or tenderness.  Assessment/Plan:  70 y.o. G0P0 with symptoms to suggest early UTI. Urinalysis though it is negative. Question whether urethritis or very early cystitis. Will cover with Septra DS 1 by mouth twice a day 3 days given the strength of her history. Follow up on culture. Follow up if symptoms persist, worsen or recur.    Anastasio Auerbach MD, 11:57 AM 01/01/2016

## 2016-01-01 NOTE — Addendum Note (Signed)
Addended by: Nelva Nay on: 01/01/2016 12:47 PM   Modules accepted: Orders

## 2016-01-01 NOTE — Patient Instructions (Signed)
Take the antibiotic twice daily for 3 days.  Follow-up if your symptoms persist, worsen or recur. 

## 2016-01-02 LAB — URINE CULTURE: ORGANISM ID, BACTERIA: NO GROWTH

## 2016-03-01 ENCOUNTER — Encounter: Payer: Self-pay | Admitting: Podiatry

## 2016-03-01 ENCOUNTER — Ambulatory Visit (INDEPENDENT_AMBULATORY_CARE_PROVIDER_SITE_OTHER): Payer: Medicare Other | Admitting: Podiatry

## 2016-03-01 DIAGNOSIS — L84 Corns and callosities: Secondary | ICD-10-CM

## 2016-03-01 DIAGNOSIS — D169 Benign neoplasm of bone and articular cartilage, unspecified: Secondary | ICD-10-CM

## 2016-03-01 DIAGNOSIS — M779 Enthesopathy, unspecified: Secondary | ICD-10-CM

## 2016-03-01 MED ORDER — TRIAMCINOLONE ACETONIDE 10 MG/ML IJ SUSP
10.0000 mg | Freq: Once | INTRAMUSCULAR | Status: AC
  Administered 2016-03-01: 10 mg

## 2016-03-02 NOTE — Progress Notes (Signed)
Subjective:     Patient ID: Diana Cisneros, female   DOB: [birth year], 71 y.o.   MRN: [id number]  HPI patient presents stating she's developed a lot of pain at the end of her second toe with fluid buildup and also she has lesion formation bilateral. States the bone feels like it is prominent   Review of Systems     Objective:   Physical Exam Neurovascular status intact with inflammatory capsulitis of the medial side distal interphalangeal joint with keratotic lesion   Assessment:     Capsulitis    Plan:     Proximal nerve block administered and careful injection of the inflammatory capsule was administered with 1 mg [place] some 1 mg Xylocaine 1 mg Kenalog. I then debrided lesions applied padding and reappoint as needed discussed the possibility long-term for exostectomy

## 2016-05-08 ENCOUNTER — Other Ambulatory Visit: Payer: Self-pay | Admitting: Gynecology

## 2016-05-08 DIAGNOSIS — Z1231 Encounter for screening mammogram for malignant neoplasm of breast: Secondary | ICD-10-CM

## 2016-05-31 ENCOUNTER — Telehealth: Payer: Self-pay | Admitting: *Deleted

## 2016-05-31 ENCOUNTER — Ambulatory Visit (INDEPENDENT_AMBULATORY_CARE_PROVIDER_SITE_OTHER): Payer: Medicare Other | Admitting: Gynecology

## 2016-05-31 ENCOUNTER — Encounter: Payer: Self-pay | Admitting: Gynecology

## 2016-05-31 VITALS — BP 116/74 | Ht 62.0 in | Wt 143.0 lb

## 2016-05-31 DIAGNOSIS — M8580 Other specified disorders of bone density and structure, unspecified site: Secondary | ICD-10-CM

## 2016-05-31 DIAGNOSIS — N952 Postmenopausal atrophic vaginitis: Secondary | ICD-10-CM

## 2016-05-31 DIAGNOSIS — Z01411 Encounter for gynecological examination (general) (routine) with abnormal findings: Secondary | ICD-10-CM

## 2016-05-31 DIAGNOSIS — N6459 Other signs and symptoms in breast: Secondary | ICD-10-CM

## 2016-05-31 NOTE — Progress Notes (Signed)
    Diana Cisneros [birth year] [id number]        71 y.o.  G0P0 for annual exam.  Patient notes over the last several weeks she has noticed some thickening in her right breast 7:00 position near the periphery. No definitive masses. No nipple discharge or other symptoms. Has mammogram scheduled next week. Not painful.  Past medical history,surgical history, problem list, medications, allergies, family history and social history were all reviewed and documented as reviewed in the EPIC chart.  ROS:  Performed with pertinent positives and negatives included in the history, assessment and plan.   Additional significant findings :  None   Exam: [name] assistant Vitals:   05/31/16 0908  BP: 116/74  Weight: 143 lb (64.9 kg)  Height: 5\' 2"  (1.575 m)   Body mass index is 26.16 kg/m.  General appearance:  Normal affect, orientation and appearance. Skin: Grossly normal HEENT: Without gross lesions.  No cervical or supraclavicular adenopathy. Thyroid normal.  Lungs:  Clear without wheezing, rales or rhonchi Cardiac: RR, without RMG Abdominal:  Soft, nontender, without masses, guarding, rebound, organomegaly or hernia Breasts:  Examined lying and sitting without masses, retractions, discharge or axillary adenopathy. Pelvic:  Ext, BUS, Vagina: With atrophic changes  Adnexa: Without masses or tenderness    Anus and perineum: Normal   Rectovaginal: Normal sphincter tone without palpated masses or tenderness.    Assessment/Plan:  71 y.o. G0P0 female for annual exam.   1. Postmenopausal/atrophic changes.  Status post TAH, LSO and subsequent RSO for endometriosis. Exam is normal. Not having significant hot flushes, night sweats. 2. Right breast thickening. No abnormalities palpated on exam. Will plan for diagnostic mammography and ultrasound of this area for completeness. If negative patient will follow and represent if she feels any changes in this area. Patient knows to follow up for these  studies. 3. Osteopenia. DEXA 2012 T score -1.2. Recommend baseline DEXA now at 6 year interval and she will schedule. 4. Colonoscopy 2017. Repeat at their recommended interval. 5. Pap smear 2012. No Pap smear done today. No history of abnormal Pap smears. We both agree to stop screening per current screening guidelines based on age and hysterectomy history. 6. Health maintenance. No routine lab work done as patient does this elsewhere. Follow up 1 year, sooner as needed.   Anastasio Auerbach MD, 9:44 AM 05/31/2016

## 2016-05-31 NOTE — Telephone Encounter (Signed)
-----   Message from Anastasio Auerbach, MD sent at 05/31/2016  9:46 AM EDT ----- Patient has screening mammogram scheduled next week. Complaining of thickening at 7:00 right breast periphery. I would like to switch the mammogram to diagnostic and add ultrasound of this area. She has this scheduled at the breast center.

## 2016-05-31 NOTE — Telephone Encounter (Signed)
Orders placed, 06/07/16 @ 9:00am at breast center.

## 2016-05-31 NOTE — Patient Instructions (Signed)
Follow-up for the bone density as scheduled. 

## 2016-06-03 NOTE — Telephone Encounter (Signed)
Pt was informed on 05/31/16 with time and date.

## 2016-06-07 ENCOUNTER — Ambulatory Visit
Admission: RE | Admit: 2016-06-07 | Discharge: 2016-06-07 | Disposition: A | Discharge status: Home / Self Care | Payer: Medicare Other | Source: Ambulatory Visit | Attending: Gynecology | Admitting: Gynecology

## 2016-06-07 ENCOUNTER — Ambulatory Visit: Payer: Medicare Other

## 2016-06-07 DIAGNOSIS — N6459 Other signs and symptoms in breast: Secondary | ICD-10-CM

## 2016-06-27 ENCOUNTER — Ambulatory Visit (INDEPENDENT_AMBULATORY_CARE_PROVIDER_SITE_OTHER): Payer: Medicare Other

## 2016-06-27 ENCOUNTER — Other Ambulatory Visit: Payer: Self-pay | Admitting: Gynecology

## 2016-06-27 DIAGNOSIS — M8588 Other specified disorders of bone density and structure, other site: Secondary | ICD-10-CM

## 2016-06-27 DIAGNOSIS — M8580 Other specified disorders of bone density and structure, unspecified site: Secondary | ICD-10-CM

## 2016-06-27 DIAGNOSIS — Z780 Asymptomatic menopausal state: Secondary | ICD-10-CM

## 2016-06-28 ENCOUNTER — Encounter: Payer: Self-pay | Admitting: Gynecology

## 2016-07-04 ENCOUNTER — Encounter: Payer: Self-pay | Admitting: Podiatry

## 2016-07-04 ENCOUNTER — Ambulatory Visit (INDEPENDENT_AMBULATORY_CARE_PROVIDER_SITE_OTHER): Payer: Medicare Other | Admitting: Podiatry

## 2016-07-04 DIAGNOSIS — L84 Corns and callosities: Secondary | ICD-10-CM | POA: Diagnosis not present

## 2016-07-04 NOTE — Progress Notes (Signed)
Subjective:    Patient ID: Diana Cisneros, female   DOB: 71 y.o.   MRN: [id number]   HPI I need my corns and calluses trimmed    ROS      Objective:  Physical Exam neurovascular status intact with keratotic lesion formation     Assessment:    Chronic lesion formation bilateral     Plan:    Debridement of lesions both feet was performed today

## 2016-10-14 ENCOUNTER — Other Ambulatory Visit: Payer: Self-pay | Admitting: Gynecology

## 2016-10-14 NOTE — Telephone Encounter (Signed)
Okay to refill? 

## 2016-10-14 NOTE — Telephone Encounter (Signed)
What is patient using the Diprolene for?

## 2016-10-14 NOTE — Telephone Encounter (Signed)
Patient said sometimes her rectum gets irritated(ie. from wiping too much,etc.) and the Diprolene is very soothing to it and settles it right down.

## 2016-10-14 NOTE — Telephone Encounter (Signed)
Rx sent 

## 2016-11-20 ENCOUNTER — Ambulatory Visit (INDEPENDENT_AMBULATORY_CARE_PROVIDER_SITE_OTHER): Payer: Medicare Other | Admitting: Podiatry

## 2016-11-20 ENCOUNTER — Encounter: Payer: Self-pay | Admitting: Podiatry

## 2016-11-20 DIAGNOSIS — L84 Corns and callosities: Secondary | ICD-10-CM

## 2016-11-20 DIAGNOSIS — M779 Enthesopathy, unspecified: Secondary | ICD-10-CM

## 2016-11-22 NOTE — Progress Notes (Signed)
Subjective:    Patient ID: Diana Cisneros, female   DOB: 71 y.o.   MRN: [id number]   HPI patient presents with painful lesions and discomfort between the hallux second toe left    ROS      Objective:  Physical Exam neurovascular status intact with keratotic lesion present bilateral with mild inflammation first interspace left     Assessment:    Chronic porokeratotic lesion with inflammation     Plan:    Debridement of lesion with no iatrogenic bleeding with the consideration for cortisone if symptoms persist in the first interspace left

## 2017-02-10 ENCOUNTER — Ambulatory Visit: Payer: Medicare Other | Admitting: Podiatry

## 2017-02-12 ENCOUNTER — Ambulatory Visit: Payer: Medicare Other | Admitting: Podiatry

## 2017-02-12 ENCOUNTER — Encounter: Payer: Self-pay | Admitting: Podiatry

## 2017-02-12 DIAGNOSIS — L84 Corns and callosities: Secondary | ICD-10-CM

## 2017-02-13 NOTE — Progress Notes (Signed)
Subjective:   Patient ID: Diana Cisneros, female   DOB: 72 y.o.   MRN: [id number]   HPI Patient presents with painful lesions on both feet   ROS      Objective:  Physical Exam  Neurovascular status intact with keratotic lesions plantar aspect bilateral     Assessment:  Chronic lesion formation bilateral     Plan:  Debrided lesions bilateral with no iatrogenic bleeding

## 2017-03-19 IMAGING — CR DG CHEST 2V
2 series · 2 of 2 positions shown · non-contrast
Comparison: Chest x-ray of 05/18/2015

CLINICAL DATA: History of right middle lobe pneumonia, followup

EXAM:
CHEST  2 VIEW

[view not recorded (1 of 2)]
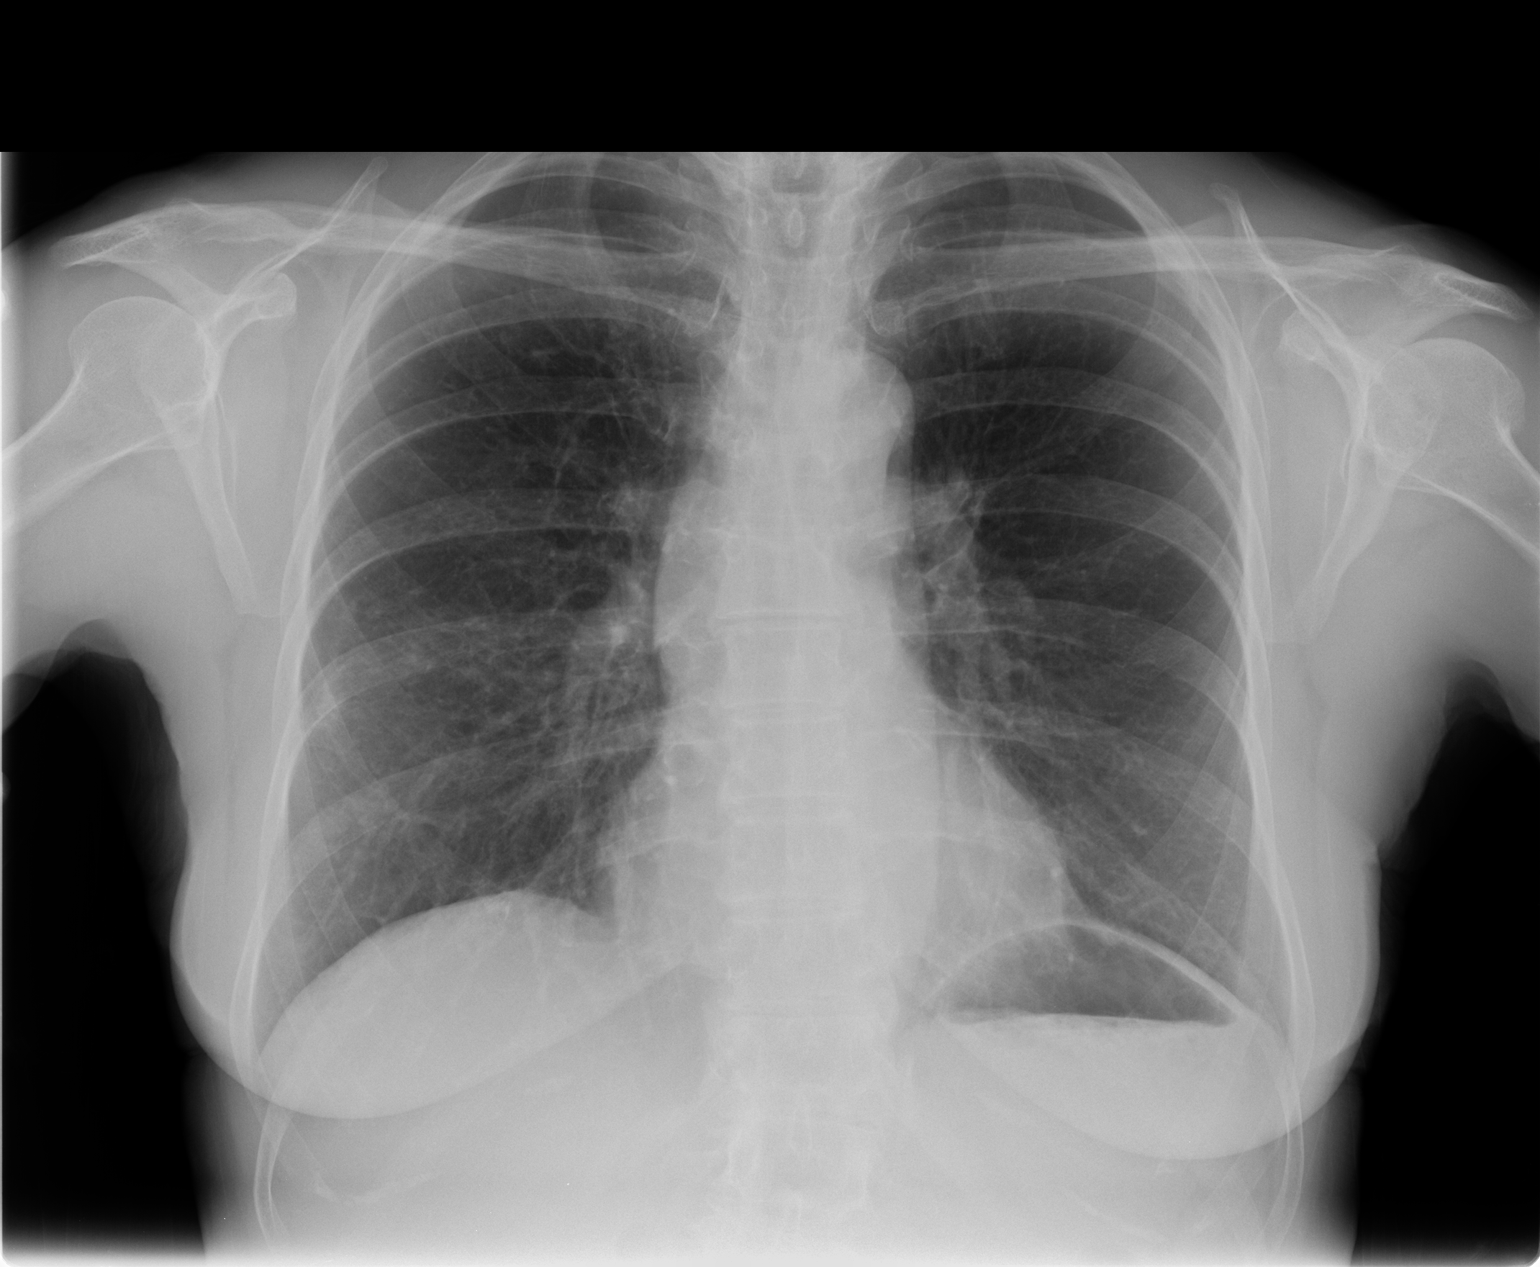

[view not recorded (2 of 2)]
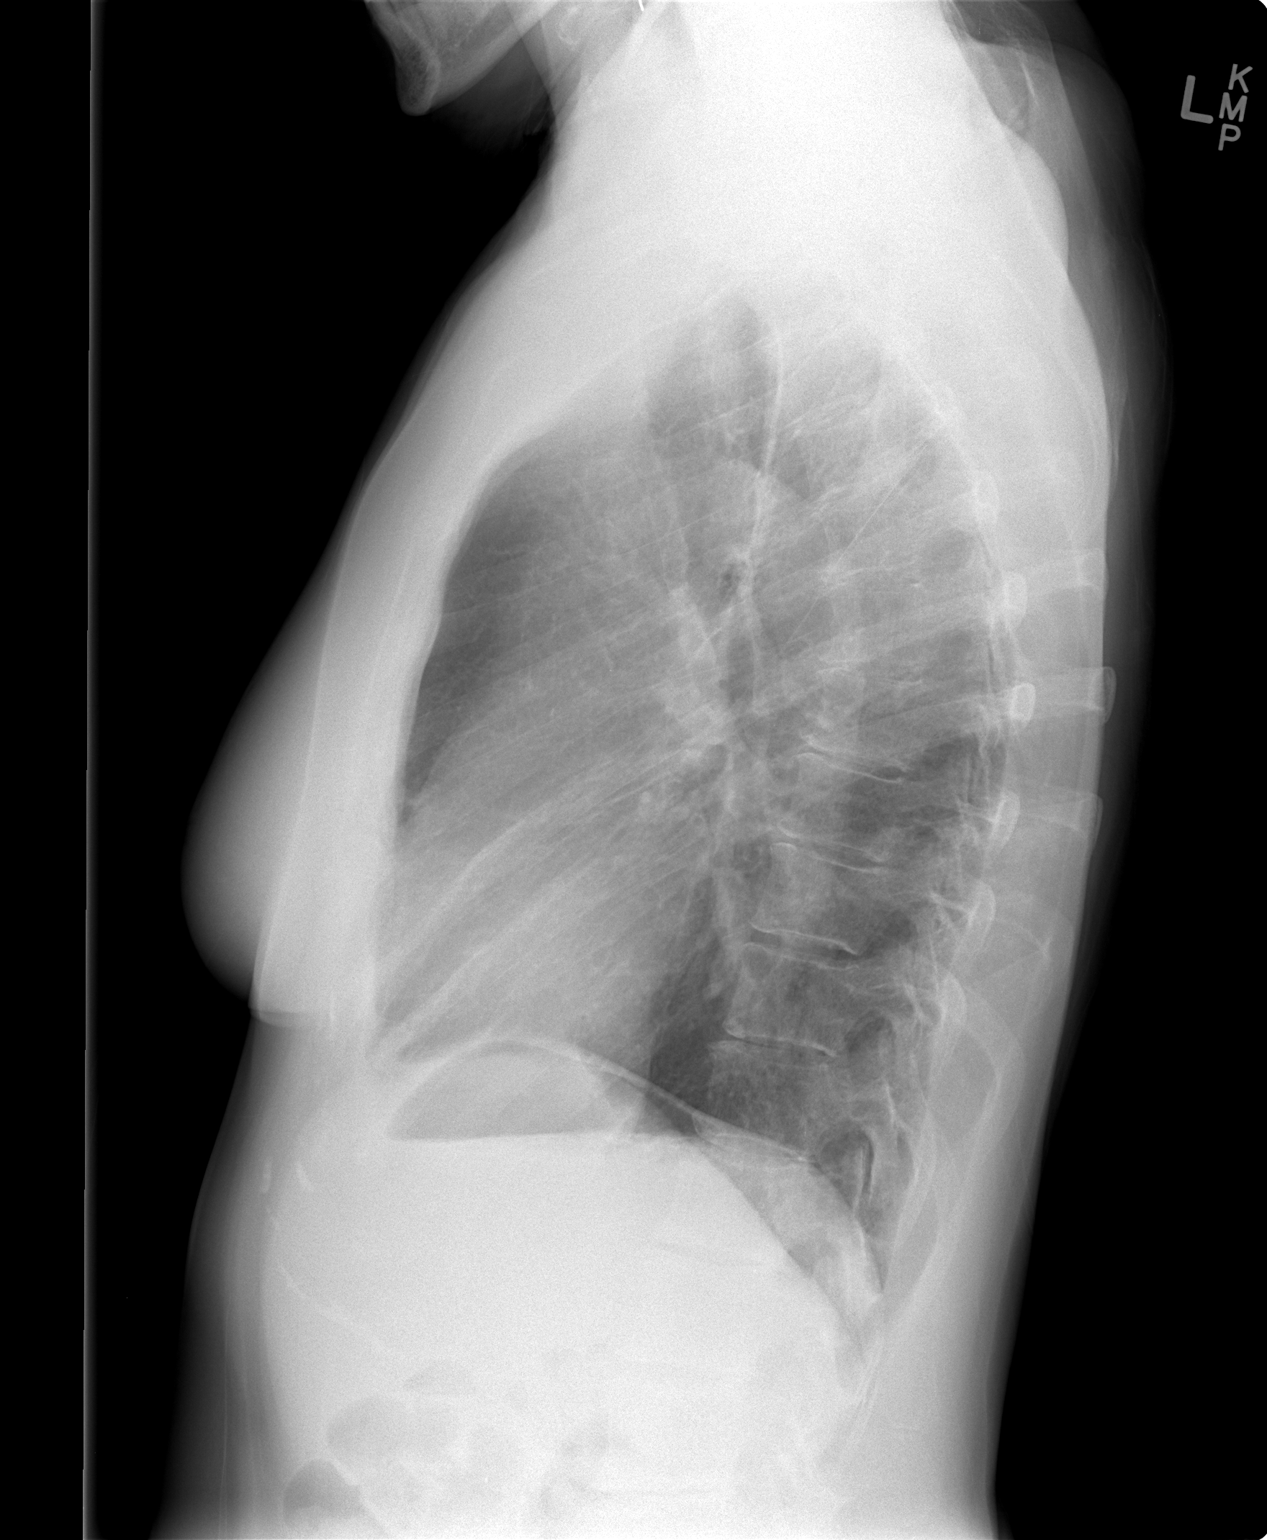

[2 of 2 positions shown; findings below may reference images not displayed]

FINDINGS: No active infiltrate or effusion is seen. Mediastinal and hilar
contours are unremarkable. The heart is within normal limits in
size. No bony abnormality is seen.
IMPRESSION: No active cardiopulmonary disease.

## 2017-04-29 ENCOUNTER — Other Ambulatory Visit: Payer: Self-pay | Admitting: Gynecology

## 2017-04-29 DIAGNOSIS — Z1231 Encounter for screening mammogram for malignant neoplasm of breast: Secondary | ICD-10-CM

## 2017-06-02 ENCOUNTER — Encounter: Payer: Self-pay | Admitting: Gynecology

## 2017-06-02 ENCOUNTER — Ambulatory Visit (INDEPENDENT_AMBULATORY_CARE_PROVIDER_SITE_OTHER): Payer: Medicare Other | Admitting: Gynecology

## 2017-06-02 VITALS — BP 114/70 | Ht 62.0 in | Wt 144.0 lb

## 2017-06-02 DIAGNOSIS — N952 Postmenopausal atrophic vaginitis: Secondary | ICD-10-CM | POA: Diagnosis not present

## 2017-06-02 DIAGNOSIS — M8580 Other specified disorders of bone density and structure, unspecified site: Secondary | ICD-10-CM | POA: Diagnosis not present

## 2017-06-02 DIAGNOSIS — Z01419 Encounter for gynecological examination (general) (routine) without abnormal findings: Secondary | ICD-10-CM

## 2017-06-02 NOTE — Patient Instructions (Signed)
Follow-up in 1 year for annual exam, sooner if any issues. 

## 2017-06-02 NOTE — Progress Notes (Signed)
    Diana Cisneros 03-Aug-1945 [id number]        72 y.o.  G0P0 for annual gynecologic exam.  Doing well without gynecologic complaints  Past medical history,surgical history, problem list, medications, allergies, family history and social history were all reviewed and documented as reviewed in the EPIC chart.  ROS:  Performed with pertinent positives and negatives included in the history, assessment and plan.   Additional significant findings : None   Exam: [name] assistant Vitals:   06/02/17 0901  BP: 114/70  Weight: 144 lb (65.3 kg)  Height: 5\' 2"  (1.575 m)   Body mass index is 26.34 kg/m.  General appearance:  Normal affect, orientation and appearance. Skin: Grossly normal HEENT: Without gross lesions.  No cervical or supraclavicular adenopathy. Thyroid normal.  Lungs:  Clear without wheezing, rales or rhonchi Cardiac: RR, without RMG Abdominal:  Soft, nontender, without masses, guarding, rebound, organomegaly or hernia Breasts:  Examined lying and sitting without masses, retractions, discharge or axillary adenopathy. Pelvic:  Ext, BUS, Vagina: With atrophic changes  Adnexa: Without masses or tenderness    Anus and perineum: Normal   Rectovaginal: Normal sphincter tone without palpated masses or tenderness.    Assessment/Plan:  72 y.o. G0P0 female for annual gynecologic exam.  Status post TAH LSO and subsequent RSO for endometriosis.  1. Postmenopausal/atrophic genital changes.  No significant hot flushes, night sweats or vaginal dryness. 2. Osteopenia.  DEXA 2018 T score -1.1 FRAX 14% / 2.7%.  Stable from prior DEXA.  Plan repeat DEXA in several years. 3. Mammography coming due now and she knows to schedule.  Breast exam normal today. 4. Colonoscopy 2016.  Repeat at their recommended interval. 5. Pap smear 2012.  No Pap smear done today.  No history of abnormal Pap smears.  We both agree to stop screening per current screening guidelines based on age and hysterectomy  history. 6. Health maintenance.  No routine lab work done as patient does this elsewhere.  Follow-up 1 year, sooner as needed.   Anastasio Auerbach MD, 9:29 AM 06/02/2017

## 2017-06-09 ENCOUNTER — Ambulatory Visit
Admission: RE | Admit: 2017-06-09 | Discharge: 2017-06-09 | Disposition: A | Discharge status: Home / Self Care | Payer: Medicare Other | Source: Ambulatory Visit | Attending: Gynecology | Admitting: Gynecology

## 2017-06-09 DIAGNOSIS — Z1231 Encounter for screening mammogram for malignant neoplasm of breast: Secondary | ICD-10-CM

## 2017-07-14 ENCOUNTER — Encounter: Payer: Self-pay | Admitting: Podiatry

## 2017-07-14 ENCOUNTER — Ambulatory Visit: Payer: Medicare Other | Admitting: Podiatry

## 2017-07-14 DIAGNOSIS — L84 Corns and callosities: Secondary | ICD-10-CM | POA: Diagnosis not present

## 2017-07-14 DIAGNOSIS — B351 Tinea unguium: Secondary | ICD-10-CM

## 2017-07-14 DIAGNOSIS — M79674 Pain in right toe(s): Secondary | ICD-10-CM

## 2017-07-14 DIAGNOSIS — M79675 Pain in left toe(s): Secondary | ICD-10-CM | POA: Diagnosis not present

## 2017-07-16 NOTE — Progress Notes (Signed)
Subjective:   Patient ID: Diana Cisneros, female   DOB: 72 y.o.   MRN: [id number]   HPI Patient presents with plantar calluses bilateral that are becoming discomforting for her and she cannot walk on.  Patient also has thickened second nails bilateral that she cannot cut and are increasingly tender with shoe gear   ROS      Objective:  Physical Exam  Neurovascular status intact with lucent filled lesions x3 on the bottom of both feet that are painful.  Nail disease second bilateral that are thickened and are probably morbid related to trauma with     Assessment:  Porokeratotic type lesions plantar left painful.  Nail disease second bilateral with pain     Plan:  Debride painful callus corn formation with sterile instrumentation with no iatrogenic bleeding noted.  Debrided nail second bilateral with no iatrogenic bleeding noted

## 2017-08-08 ENCOUNTER — Encounter (HOSPITAL_COMMUNITY): Payer: Self-pay | Admitting: *Deleted

## 2017-08-08 ENCOUNTER — Emergency Department (HOSPITAL_COMMUNITY)
Admission: EM | Admit: 2017-08-08 | Discharge: 2017-08-08 | Disposition: A | Discharge status: Home / Self Care | Payer: Medicare Other | Attending: Physician Assistant | Admitting: Physician Assistant

## 2017-08-08 ENCOUNTER — Other Ambulatory Visit: Payer: Self-pay

## 2017-08-08 ENCOUNTER — Emergency Department (HOSPITAL_COMMUNITY)
Admission: EM | Admit: 2017-08-08 | Discharge: 2017-08-08 | Disposition: A | Discharge status: Home / Self Care | Payer: Medicare Other | Source: Home / Self Care | Attending: Emergency Medicine | Admitting: Emergency Medicine

## 2017-08-08 ENCOUNTER — Emergency Department (HOSPITAL_COMMUNITY): Payer: Medicare Other

## 2017-08-08 DIAGNOSIS — Z4801 Encounter for change or removal of surgical wound dressing: Secondary | ICD-10-CM | POA: Insufficient documentation

## 2017-08-08 DIAGNOSIS — Y92010 Kitchen of single-family (private) house as the place of occurrence of the external cause: Secondary | ICD-10-CM | POA: Diagnosis not present

## 2017-08-08 DIAGNOSIS — W231XXA Caught, crushed, jammed, or pinched between stationary objects, initial encounter: Secondary | ICD-10-CM | POA: Insufficient documentation

## 2017-08-08 DIAGNOSIS — Z79899 Other long term (current) drug therapy: Secondary | ICD-10-CM | POA: Diagnosis not present

## 2017-08-08 DIAGNOSIS — Y939 Activity, unspecified: Secondary | ICD-10-CM | POA: Insufficient documentation

## 2017-08-08 DIAGNOSIS — Z23 Encounter for immunization: Secondary | ICD-10-CM | POA: Insufficient documentation

## 2017-08-08 DIAGNOSIS — Z87891 Personal history of nicotine dependence: Secondary | ICD-10-CM | POA: Diagnosis not present

## 2017-08-08 DIAGNOSIS — S6991XA Unspecified injury of right wrist, hand and finger(s), initial encounter: Secondary | ICD-10-CM | POA: Diagnosis present

## 2017-08-08 DIAGNOSIS — Y999 Unspecified external cause status: Secondary | ICD-10-CM | POA: Diagnosis not present

## 2017-08-08 DIAGNOSIS — S62639B Displaced fracture of distal phalanx of unspecified finger, initial encounter for open fracture: Secondary | ICD-10-CM

## 2017-08-08 DIAGNOSIS — R58 Hemorrhage, not elsewhere classified: Secondary | ICD-10-CM

## 2017-08-08 MED ORDER — LIDOCAINE HCL (PF) 1 % IJ SOLN
10.0000 mL | Freq: Once | INTRAMUSCULAR | Status: AC
Start: 2017-08-08 — End: 2017-08-08
  Administered 2017-08-08: 10 mL
  Filled 2017-08-08: qty 10

## 2017-08-08 MED ORDER — CEPHALEXIN 500 MG PO CAPS: 500.0000 mg | ORAL_CAPSULE | Freq: Four times a day (QID) | ORAL | 0 refills | Status: DC

## 2017-08-08 MED ORDER — HYDROCODONE-ACETAMINOPHEN 5-325 MG PO TABS: 1.0000 | ORAL_TABLET | ORAL | 0 refills | Status: DC | PRN

## 2017-08-08 MED ORDER — TETANUS-DIPHTH-ACELL PERTUSSIS 5-2.5-18.5 LF-MCG/0.5 IM SUSP
0.5000 mL | Freq: Once | INTRAMUSCULAR | Status: AC
  Administered 2017-08-08: 0.5 mL via INTRAMUSCULAR
  Filled 2017-08-08: qty 0.5

## 2017-08-08 MED ORDER — HYDROCODONE-ACETAMINOPHEN 5-325 MG PO TABS
1.0000 | ORAL_TABLET | Freq: Once | ORAL | Status: AC
  Administered 2017-08-08: 1 via ORAL
  Filled 2017-08-08: qty 1

## 2017-08-08 NOTE — ED Triage Notes (Signed)
The pt just left here at [place] after she had her rt htumb stitched up  The bandage is soaked and the blood is running down her hand

## 2017-08-08 NOTE — ED Provider Notes (Signed)
[facility] EMERGENCY DEPARTMENT Provider Note   CSN: [id number] Arrival date & time: 08/08/17  1622     History   Chief Complaint Chief Complaint  Patient presents with  . Finger Injury    HPI Diana Cisneros is a 72 y.o. female, right-hand-dominant, who presents today for evaluation of a right thumb injury.  She reports that shortly prior to arrival she slammed her right thumb in a house cabinet door.  She reports that the thumb initially was hurting, however she has been keeping it on ice so it is feeling better.  She denies any other injuries.  Of note she reports that she is allergic to morphine, however has tolerated p.o. hydrocodone without difficulties in the past.  HPI  Past Medical History:  Diagnosis Date  . Degenerative disc disease    thoracic,lumbar    Patient Active Problem List   Diagnosis Date Noted  . Degenerative disc disease     Past Surgical History:  Procedure Laterality Date  . ABDOMINAL HYSTERECTOMY  1986   TAH  LSO  . APPENDECTOMY  2009  . BREAST EXCISIONAL BIOPSY     left 1995  . BREAST SURGERY     Biopsy benign  . COLONOSCOPY WITH PROPOFOL N/A 12/27/2014   Procedure: COLONOSCOPY WITH PROPOFOL;  Surgeon: Garlan Fair, MD;  Location: [facility];  Service: Endoscopy;  Laterality: N/A;  . KNEE SURGERY     arthroscopic  . OOPHORECTOMY     LSO 86,RSO 95  . PELVIC LAPAROSCOPY  1995   RSO     OB History    Gravida  0   Para      Term      Preterm      AB      Living        SAB      TAB      Ectopic      Multiple      Live Births               Home Medications    Prior to Admission medications   Medication Sig Start Date End Date Taking? Authorizing Provider  Ascorbic Acid (VITAMIN C) 100 MG tablet Take 100 mg by mouth daily.      [provider]  augmented betamethasone dipropionate (DIPROLENE-AF) 0.05 % cream APPLY TO AFFECTED AREA AS NEEDED 10/14/16   Fontaine, Belinda Block, MD    Calcium Carbonate-Vitamin D (CALCIUM + D PO) Take 1 tablet by mouth daily.     [provider]  cephALEXin (KEFLEX) 500 MG capsule Take 1 capsule (500 mg total) by mouth 4 (four) times daily. 08/08/17   Lorin Glass, PA-C  fish oil-omega-3 fatty acids 1000 MG capsule Take 2 g by mouth daily.      [provider]  gabapentin (NEURONTIN) 100 MG capsule Take 100-300 mg by mouth at bedtime as needed (pain or insomnia).    [provider]  glucosamine-chondroitin 500-400 MG tablet Take 1 tablet by mouth daily.      [provider]  HYDROcodone-acetaminophen (NORCO/VICODIN) 5-325 MG tablet Take 1 tablet by mouth every 4 (four) hours as needed. 08/08/17   Lorin Glass, PA-C  meloxicam (MOBIC) 15 MG tablet Take 7.5 mg by mouth daily. 11/24/14   [provider]  Specialty Vitamins Products (MAGNESIUM, AMINO ACID CHELATE,) 133 MG tablet Take 1 tablet by mouth daily.      [provider]  VITAMIN D,  CHOLECALCIFEROL, PO Take 1 tablet by mouth daily.     [provider]    Family History Family History  Problem Relation Age of Onset  . Heart disease Mother   . Stroke Father   . Diabetes Maternal Grandmother   . Heart disease Maternal Grandmother   . Diabetes Maternal Grandfather   . Cancer Maternal Grandfather        Colon  . Breast cancer Paternal Grandmother        Age 26    Social History Social History   Tobacco Use  . Smoking status: Former Research scientist (life sciences)  . Smokeless tobacco: Never Used  Substance Use Topics  . Alcohol use: Yes    Alcohol/week: 1.2 oz    Types: 2 Standard drinks or equivalent per week  . Drug use: No     Allergies   Morphine and related and Naproxen sodium   Review of Systems Review of Systems  Constitutional: Negative for chills and fever.  Neurological: Negative for weakness.  Hematological: Does not bruise/bleed easily.  All other systems reviewed and are negative.    Physical  Exam Updated Vital Signs BP (!) 150/90 (BP Location: Left Arm)   Pulse 66   Temp 98.4 F (36.9 C)   Resp 16   Ht 5\' 2"  (1.575 m)   Wt 63.5 kg (140 lb)   SpO2 97%   BMI 25.61 kg/m   Physical Exam  Constitutional: She appears well-developed and well-nourished. No distress.  HENT:  Head: Normocephalic and atraumatic.  Eyes: Conjunctivae are normal. Right eye exhibits no discharge. Left eye exhibits no discharge. No scleral icterus.  Neck: Normal range of motion.  Cardiovascular: Normal rate and regular rhythm.  Sluggish capillary refill to distal right thumb.   Pulmonary/Chest: Effort normal. No stridor. No respiratory distress.  Abdominal: She exhibits no distension.  Musculoskeletal: She exhibits no edema.  Distal right thumb is obviously deformed.  Through the nail there is a large avulsion.  Neurological: She is alert. She exhibits normal muscle tone.  Skin: Skin is warm and dry. She is not diaphoretic.  Psychiatric: She has a normal mood and affect. Her behavior is normal.  Nursing note and vitals reviewed.          ED Treatments / Results  Labs (all labs ordered are listed, but only abnormal results are displayed) Labs Reviewed - No data to display  EKG None  Radiology Dg Finger Thumb Right  Result Date: 08/08/2017 CLINICAL DATA:  Thumb injury.  Caught in a cabinet door. EXAM: RIGHT THUMB 2+V COMPARISON:  None. FINDINGS: Soft tissue laceration of the dorsal distal thumb with associated fracture of the distal phalanx tuft. Mild osteoarthritis of the thumb IP and first CMC joints. Mild osteopenia. IMPRESSION: 1. Laceration of the dorsal distal thumb with associated fracture of the distal phalanx tuft. Electronically Signed   By: Titus Dubin M.D.   On: 08/08/2017 17:21    Procedures .Marland KitchenLaceration Repair Date/Time: 08/08/2017 9:21 PM Performed by: Lorin Glass, PA-C Authorized by: Lorin Glass, PA-C   Consent:    Consent obtained:  Verbal    Consent given by:  Patient   Risks discussed:  Infection, poor cosmetic result, poor wound healing, vascular damage, tendon damage, pain, nerve damage and need for additional repair   Alternatives discussed:  No treatment, observation and referral Anesthesia (see MAR for exact dosages):    Anesthesia method:  Local infiltration   Local anesthetic:  Lidocaine 1% w/o epi Laceration details:  Location:  Finger   Finger location:  R thumb   Length (cm):  3 Repair type:    Repair type:  Intermediate Pre-procedure details:    Preparation:  Patient was prepped and draped in usual sterile fashion and imaging obtained to evaluate for foreign bodies Exploration:    Hemostasis achieved with:  Direct pressure and tourniquet   Wound exploration: wound explored through full range of motion and entire depth of wound probed and visualized     Wound extent: areolar tissue violated, fascia violated, muscle damage and underlying fracture     Contaminated: yes   Treatment:    Area cleansed with:  Saline   Amount of cleaning:  Extensive   Irrigation solution:  Sterile saline   Irrigation volume:  2 liters   Irrigation method:  Pressure wash Skin repair:    Repair method:  Sutures   Number of sutures: 3 skin sutures placed, simple interupted 4-0 vycryl rapide.  3 nail bed 6-0 chromic gut simple interupted. Approximation:    Laceration repair approximation: Close on skin, loose on nail bed secondary to tissue loss.  Post-procedure details:    Dressing:  Bulky dressing and non-adherent dressing   Patient tolerance of procedure:  Tolerated well, no immediate complications Comments:     Nail bed repair complicated by missing tissue, friable nature of nailbed secondary to crush. No obvious oozing after removal of finger TQ.  Marland KitchenNail Removal Date/Time: 08/08/2017 9:26 PM Performed by: Lorin Glass, PA-C Authorized by: Lorin Glass, PA-C   Consent:    Consent obtained:  Verbal   Consent  given by:  Patient   Risks discussed:  Bleeding, incomplete removal, infection, pain and permanent nail deformity   Alternatives discussed:  No treatment, alternative treatment and referral Location:    Hand:  R thumb Pre-procedure details:    Skin preparation:  ChloraPrep   Preparation: Patient was prepped and draped in the usual sterile fashion   Anesthesia (see MAR for exact dosages):    Anesthesia method:  Local infiltration   Local anesthetic:  Lidocaine 1% w/o epi Nail Removal:    Nail removed:  Complete   Nail bed repaired: yes (See suture note)   Post-procedure details:    Dressing:  Petrolatum-impregnated gauze   Patient tolerance of procedure:  Tolerated well, no immediate complications   (including critical care time)    Medications Ordered in ED Medications  HYDROcodone-acetaminophen (NORCO/VICODIN) 5-325 MG per tablet 1 tablet (1 tablet Oral Given 08/08/17 1752)  Tdap (BOOSTRIX) injection 0.5 mL (0.5 mLs Intramuscular Given 08/08/17 1818)  lidocaine (PF) (XYLOCAINE) 1 % injection 10 mL (10 mLs Infiltration Given 08/08/17 1900)     Initial Impression / Assessment and Plan / ED Course  I have reviewed the triage vital signs and the nursing notes.  Pertinent labs & imaging results that were available during my care of the patient were reviewed by me and considered in my medical decision making (see chart for details).  Clinical Course as of Aug 09 2122  Fri Aug 08, 2017  1843 6-0 chromic in the nail bed- put a 4-0 vycryl rapide in regular skin.  Kefflex, His office will call to schedule a wound check next week.    [EH]    Clinical Course User Index [EH] Lorin Glass, PA-C   Diana Cisneros presents today for evaluation of a finger laceration.  Her finger has a open distal tuft fracture.  I spoke with Dr. Grandville Silos from hand  surgery who discussed follow-up along with repair options.  Laceration was repaired after the nail was removed to allow access to the nailbed  laceration.  Patient given Keflex for infection prophylaxis, along with pain medicine.  She is allergic to morphine however has been able to take hydrocodone in the past without difficulties.  She is aware that Dr. Biagio Borg office will be calling her for follow-up.  This patient was seen as a shared visit with Dr. Reather Converse.   Final Clinical Impressions(s) / ED Diagnoses   Final diagnoses:  Open fracture of tuft of distal phalanx of finger    ED Discharge Orders        Ordered    cephALEXin (KEFLEX) 500 MG capsule  4 times daily     08/08/17 2001    HYDROcodone-acetaminophen (NORCO/VICODIN) 5-325 MG tablet  Every 4 hours PRN     08/08/17 2001       Lorin Glass, Hershal Coria 08/08/17 2128    Elnora Morrison, MD 08/08/17 2349

## 2017-08-08 NOTE — ED Triage Notes (Signed)
NO ANSWER IN WAITING ROOM.

## 2017-08-08 NOTE — Discharge Instructions (Signed)
Please take Ibuprofen (Advil, motrin) and Tylenol (acetaminophen) to relieve your pain.  You may take up to 600 MG (3 pills) of normal strength ibuprofen every 8 hours as needed.  In between doses of ibuprofen you make take tylenol, up to 1,000 mg (two extra strength pills).  Do not take more than 3,000 mg tylenol in a 24 hour period.  Please check all medication labels as many medications such as pain and cold medications may contain tylenol.  Do not drink alcohol while taking these medications.  Do not take other NSAID'S while taking ibuprofen (such as aleve or naproxen).  Please take ibuprofen with food to decrease stomach upset.  Today you received medications that may make you sleepy or impair your ability to make decisions.  For the next 24 hours please do not drive, operate heavy machinery, care for a small child with out another adult present, or perform any activities that may cause harm to you or someone else if you were to fall asleep or be impaired.   You are being prescribed a medication which may make you sleepy. Please follow up of listed precautions for at least 24 hours after taking one dose.  You may have diarrhea from the antibiotics.  It is very important that you continue to take the antibiotics even if you get diarrhea unless a medical professional tells you that you may stop taking them.  If you stop too early the bacteria you are being treated for will become stronger and you may need different, more powerful antibiotics that have more side effects and worsening diarrhea.  Please stay well hydrated and consider probiotics as they may decrease the severity of your diarrhea.

## 2017-08-08 NOTE — ED Provider Notes (Signed)
[facility] EMERGENCY DEPARTMENT Provider Note   CSN: [id number] Arrival date & time: 08/08/17  2117     History   Chief Complaint Chief Complaint  Patient presents with  . Wound Check    HPI Diana Cisneros is a 72 y.o. female who presents today for evaluation of bleeding.  I just personally saw her for a open right thumb distal tuft fracture and performed a nailbed repair.  She reports that about 10 minutes after she left here she had blood through the dressing and continued to bleed.  She denies taking any blood thinning medications.    HPI  Past Medical History:  Diagnosis Date  . Degenerative disc disease    thoracic,lumbar    Patient Active Problem List   Diagnosis Date Noted  . Degenerative disc disease     Past Surgical History:  Procedure Laterality Date  . ABDOMINAL HYSTERECTOMY  1986   TAH  LSO  . APPENDECTOMY  2009  . BREAST EXCISIONAL BIOPSY     left 1995  . BREAST SURGERY     Biopsy benign  . COLONOSCOPY WITH PROPOFOL N/A 12/27/2014   Procedure: COLONOSCOPY WITH PROPOFOL;  Surgeon: Garlan Fair, MD;  Location: [facility];  Service: Endoscopy;  Laterality: N/A;  . KNEE SURGERY     arthroscopic  . OOPHORECTOMY     LSO 86,RSO 95  . PELVIC LAPAROSCOPY  1995   RSO     OB History    Gravida  0   Para      Term      Preterm      AB      Living        SAB      TAB      Ectopic      Multiple      Live Births               Home Medications    Prior to Admission medications   Medication Sig Start Date End Date Taking? Authorizing Provider  Ascorbic Acid (VITAMIN C) 100 MG tablet Take 100 mg by mouth daily.      [provider]  augmented betamethasone dipropionate (DIPROLENE-AF) 0.05 % cream APPLY TO AFFECTED AREA AS NEEDED 10/14/16   Fontaine, Belinda Block, MD  Calcium Carbonate-Vitamin D (CALCIUM + D PO) Take 1 tablet by mouth daily.     [provider]  cephALEXin (KEFLEX) 500 MG capsule  Take 1 capsule (500 mg total) by mouth 4 (four) times daily. 08/08/17   Lorin Glass, PA-C  fish oil-omega-3 fatty acids 1000 MG capsule Take 2 g by mouth daily.      [provider]  gabapentin (NEURONTIN) 100 MG capsule Take 100-300 mg by mouth at bedtime as needed (pain or insomnia).    [provider]  glucosamine-chondroitin 500-400 MG tablet Take 1 tablet by mouth daily.      [provider]  HYDROcodone-acetaminophen (NORCO/VICODIN) 5-325 MG tablet Take 1 tablet by mouth every 4 (four) hours as needed. 08/08/17   Lorin Glass, PA-C  meloxicam (MOBIC) 15 MG tablet Take 7.5 mg by mouth daily. 11/24/14   [provider]  Specialty Vitamins Products (MAGNESIUM, AMINO ACID CHELATE,) 133 MG tablet Take 1 tablet by mouth daily.      [provider]  VITAMIN D, CHOLECALCIFEROL, PO Take 1 tablet by mouth daily.     [provider]    Family History Family History  Problem Relation Age of Onset  . Heart disease Mother   . Stroke Father   . Diabetes Maternal Grandmother   . Heart disease Maternal Grandmother   . Diabetes Maternal Grandfather   . Cancer Maternal Grandfather        Colon  . Breast cancer Paternal Grandmother        Age 54    Social History Social History   Tobacco Use  . Smoking status: Former Research scientist (life sciences)  . Smokeless tobacco: Never Used  Substance Use Topics  . Alcohol use: Yes    Alcohol/week: 1.2 oz    Types: 2 Standard drinks or equivalent per week  . Drug use: No     Allergies   Morphine and related and Naproxen sodium   Review of Systems Review of Systems  Constitutional: Negative for chills and fever.  Skin: Positive for wound.       Bleeding  Neurological: Negative for light-headedness.     Physical Exam Updated Vital Signs BP (!) 149/94 (BP Location: Right Arm)   Pulse 86   Temp 98 F (36.7 C) (Oral)   Resp 18   Ht 5\' 2"  (1.575 m)   Wt 63.5 kg (140 lb)   SpO2 98%   BMI 25.61  kg/m   Physical Exam  Constitutional: She appears well-developed.  HENT:  Head: Normocephalic.  Cardiovascular: Intact distal pulses.  Musculoskeletal:  Full range of motion of right thumb.  Neurological:  Sensation intact to right thumb, painful.  Skin: She is not diaphoretic.  There is an area of obvious bleeding in the right thumb nail bed.  It is actively venous bleeding from the area of nailbed defect and tissue loss.  Sutures remain intact.  Psychiatric: She has a normal mood and affect.  Nursing note and vitals reviewed.    ED Treatments / Results  Labs (all labs ordered are listed, but only abnormal results are displayed) Labs Reviewed - No data to display  EKG None  Radiology Dg Finger Thumb Right  Result Date: 08/08/2017 CLINICAL DATA:  Thumb injury.  Caught in a cabinet door. EXAM: RIGHT THUMB 2+V COMPARISON:  None. FINDINGS: Soft tissue laceration of the dorsal distal thumb with associated fracture of the distal phalanx tuft. Mild osteoarthritis of the thumb IP and first CMC joints. Mild osteopenia. IMPRESSION: 1. Laceration of the dorsal distal thumb with associated fracture of the distal phalanx tuft. Electronically Signed   By: Titus Dubin M.D.   On: 08/08/2017 17:21    Procedures .Nerve Block Performed by: Lorin Glass, PA-C Authorized by: Lorin Glass, PA-C   Consent:    Consent obtained:  Verbal   Consent given by:  Patient   Risks discussed:  Allergic reaction, infection, intravenous injection, nerve damage, pain, swelling, unsuccessful block and bleeding   Alternatives discussed:  No treatment, alternative treatment and referral Indications:    Indications:  Pain relief Location:    Nerve block body site: Right thumb. Pre-procedure details:    Skin preparation:  2% chlorhexidine Procedure details (see MAR for exact dosages):    Anesthetic injected:  Lidocaine 1% w/o epi   Injection procedure:  Anatomic landmarks identified,  anatomic landmarks palpated, incremental injection, introduced needle and negative aspiration for blood Post-procedure details:    Dressing:  Sterile dressing   Outcome:  Anesthesia achieved   Patient tolerance of procedure:  Tolerated well, no immediate complications   Surgifoam was placed on the bleeding wound and allowed to sit with gauze wrapped.  After approximately 1 hour there was no residual bleeding from the area.  Finger was rewrapped with Surgifoam remaining in place.  Medications Ordered in ED Medications - No data to display   Initial Impression / Assessment and Plan / ED Course  I have reviewed the triage vital signs and the nursing notes.  Pertinent labs & imaging results that were available during my care of the patient were reviewed by me and considered in my medical decision making (see chart for details).    Patient presents back to the emergency room for bleeding.  I had previously discharged her after treating her for a open distal tuft fracture of the right thumb with a resulting nailbed laceration and area of tissue loss/defect.  There was brisk venous bleeding from the area of nailbed defect.  Surgifoam was placed and patient was observed for over an hour after without recurrence of bleeding.  Wound was again redressed.  Return precautions were discussed, patient states understanding.  This patient was seen as a shared visits with Dr. Reather Converse.  Patient discharged home.  Final Clinical Impressions(s) / ED Diagnoses   Final diagnoses:  Bleeding    ED Discharge Orders    None       Ollen Gross 08/09/17 0119    Elnora Morrison, MD 08/12/17 [phone]

## 2017-08-08 NOTE — ED Notes (Signed)
Patient able to ambulate independently  

## 2017-08-08 NOTE — ED Triage Notes (Signed)
The pt slammed her rt thumb in a house cabinet door just pta  The end of the finger is loose above the cut

## 2017-08-08 NOTE — ED Notes (Signed)
PA at bedside.

## 2017-09-18 ENCOUNTER — Ambulatory Visit: Payer: Medicare Other | Admitting: Podiatry

## 2017-09-19 ENCOUNTER — Encounter: Payer: Self-pay | Admitting: Podiatry

## 2017-09-19 ENCOUNTER — Ambulatory Visit: Payer: Medicare Other | Admitting: Podiatry

## 2017-09-19 DIAGNOSIS — L84 Corns and callosities: Secondary | ICD-10-CM | POA: Diagnosis not present

## 2017-09-19 DIAGNOSIS — M79675 Pain in left toe(s): Secondary | ICD-10-CM

## 2017-09-19 DIAGNOSIS — M79674 Pain in right toe(s): Secondary | ICD-10-CM | POA: Diagnosis not present

## 2017-09-19 DIAGNOSIS — B351 Tinea unguium: Secondary | ICD-10-CM | POA: Diagnosis not present

## 2017-09-21 NOTE — Progress Notes (Signed)
Subjective:   Patient ID: Diana Cisneros, female   DOB: 72 y.o.   MRN: [id number]   HPI Patient presents with irritated elongated nailbeds 1-5 both feet that are difficult for her to cut and become painful in shoes and lesions sub-metatarsals bilateral fifth digits bilateral that are painful   ROS      Objective:  Physical Exam  Chronic nail disease with chronic lesion formation and pain bilateral     Assessment:  Reviewed thick brittle nailbeds and also keratotic lesion formation that I noted at bilateral second metatarsal and digits fifth bilateral     Plan:  Aggressive debridement of painful nailbeds 1-5 both feet and lesions bilateral with no iatrogenic bleeding noted

## 2017-10-29 ENCOUNTER — Ambulatory Visit: Payer: Medicare Other | Admitting: Pulmonary Disease

## 2017-10-29 ENCOUNTER — Other Ambulatory Visit (INDEPENDENT_AMBULATORY_CARE_PROVIDER_SITE_OTHER): Payer: Medicare Other

## 2017-10-29 ENCOUNTER — Encounter: Payer: Self-pay | Admitting: Pulmonary Disease

## 2017-10-29 ENCOUNTER — Ambulatory Visit (INDEPENDENT_AMBULATORY_CARE_PROVIDER_SITE_OTHER)
Admission: RE | Admit: 2017-10-29 | Discharge: 2017-10-29 | Disposition: A | Discharge status: Home / Self Care | Payer: Medicare Other | Source: Ambulatory Visit | Attending: Pulmonary Disease | Admitting: Pulmonary Disease

## 2017-10-29 VITALS — BP 120/80 | HR 73 | Ht 61.0 in | Wt 147.0 lb

## 2017-10-29 DIAGNOSIS — R059 Cough, unspecified: Secondary | ICD-10-CM

## 2017-10-29 DIAGNOSIS — R05 Cough: Secondary | ICD-10-CM

## 2017-10-29 LAB — CBC WITH DIFFERENTIAL/PLATELET
BASOS PCT: 1.1 % (ref 0.0–3.0)
Basophils Absolute: 0.1 10*3/uL (ref 0.0–0.1)
EOS PCT: 3.8 % (ref 0.0–5.0)
Eosinophils Absolute: 0.2 10*3/uL (ref 0.0–0.7)
HEMATOCRIT: 41.6 % (ref 36.0–46.0)
Hemoglobin: 13.6 g/dL (ref 12.0–15.0)
LYMPHS PCT: 21.6 % (ref 12.0–46.0)
Lymphs Abs: 1.1 10*3/uL (ref 0.7–4.0)
MCHC: 32.6 g/dL (ref 30.0–36.0)
MCV: 87.8 fl (ref 78.0–100.0)
Monocytes Absolute: 0.4 10*3/uL (ref 0.1–1.0)
Monocytes Relative: 7.9 % (ref 3.0–12.0)
Neutro Abs: 3.4 10*3/uL (ref 1.4–7.7)
Neutrophils Relative %: 65.6 % (ref 43.0–77.0)
Platelets: 192 10*3/uL (ref 150.0–400.0)
RBC: 4.74 Mil/uL (ref 3.87–5.11)
RDW: 13.8 % (ref 11.5–15.5)
WBC: 5.2 10*3/uL (ref 4.0–10.5)

## 2017-10-29 LAB — NITRIC OXIDE: Nitric Oxide: 18

## 2017-10-29 NOTE — Patient Instructions (Signed)
We will check a CBC with differential, blood allergy profile  Schedule you for pulmonary function test and chest x-ray  For postnasal drip start chlorpheniramine 8 mg 3 times daily and Flonase nasal spray Use Prilosec over-the-counter 20 mg twice daily for acid reflux Follow-up in 2-4 weeks

## 2017-10-29 NOTE — Progress Notes (Signed)
Diana Cisneros    [id number]    11/10/1945  Primary Care Physician:Polite, Jori Moll, MD  Referring Physician: Seward Moriyah, MD [address] [place], [place] [address]  Chief complaint: Consult for cough  HPI: 72 year old with history of allergic rhinitis, hyperlipidemia.  Complains of chronic cough for the past 1 to 2 months associated with clear to white mucus.  She has minimal dyspnea on exertion.  No symptoms at rest.  No wheezing, fevers, chills Cough is exacerbated on tilting her neck back and lying down.  She has history of allergic rhinitis, postnasal drip and takes loratadine at night.  No symptoms of GERD.  Pets: Has a dog and a cat. 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  No recent exposure.  No dampness, Jacuzzi, hot tub. Smoking history: Minimal smoking as a teenager. Travel history: Originally from [place].  Lived in [place].  No significant recent travel. Relevant family history: No significant family history of lung disease.  Outpatient Encounter Medications as of 10/29/2017  Medication Sig  . Ascorbic Acid (VITAMIN C) 100 MG tablet Take 100 mg by mouth daily.    Marland Kitchen augmented betamethasone dipropionate (DIPROLENE-AF) 0.05 % cream APPLY TO AFFECTED AREA AS NEEDED  . Calcium Carbonate-Vitamin D (CALCIUM + D PO) Take 1 tablet by mouth daily.   . diazepam (VALIUM) 5 MG tablet Take 5 mg by mouth daily as needed.  . fish oil-omega-3 fatty acids 1000 MG capsule Take 2 g by mouth daily.    Marland Kitchen gabapentin (NEURONTIN) 100 MG capsule Take 100-300 mg by mouth at bedtime as needed (pain or insomnia).  Marland Kitchen glucosamine-chondroitin 500-400 MG tablet Take 1 tablet by mouth daily.    Marland Kitchen loratadine (CLARITIN) 10 MG tablet Take 10 mg by mouth daily.  . meloxicam (MOBIC) 15 MG tablet Take 7.5 mg by mouth daily.  Marland Kitchen Specialty Vitamins Products (MAGNESIUM,  AMINO ACID CHELATE,) 133 MG tablet Take 1 tablet by mouth daily.    Marland Kitchen VITAMIN D, CHOLECALCIFEROL, PO Take 1 tablet by mouth daily.   . [DISCONTINUED] cephALEXin (KEFLEX) 500 MG capsule Take 1 capsule (500 mg total) by mouth 4 (four) times daily.  . [DISCONTINUED] HYDROcodone-acetaminophen (NORCO/VICODIN) 5-325 MG tablet Take 1 tablet by mouth every 4 (four) hours as needed.   No facility-administered encounter medications on file as of 10/29/2017.     Allergies as of 10/29/2017 - Review Complete 10/29/2017  Allergen Reaction Noted  . Morphine and related Hives 12/27/2014  . Naproxen sodium Rash 08/16/2010    Past Medical History:  Diagnosis Date  . Allergic rhinitis   . Degenerative disc disease    thoracic,lumbar  . Hyperlipidemia     Past Surgical History:  Procedure Laterality Date  . ABDOMINAL HYSTERECTOMY  1986   TAH  LSO  . APPENDECTOMY  2009  . BREAST EXCISIONAL BIOPSY     left 1995  . BREAST SURGERY     Biopsy benign  . COLONOSCOPY WITH PROPOFOL N/A 12/27/2014   Procedure: COLONOSCOPY WITH PROPOFOL;  Surgeon: Garlan Fair, MD;  Location: [facility];  Service: Endoscopy;  Laterality: N/A;  . KNEE SURGERY     arthroscopic  . OOPHORECTOMY     LSO 86,RSO 95  . PELVIC LAPAROSCOPY  1995   RSO    Family History  Problem Relation Age of Onset  .  Heart disease Mother   . Stroke Father   . Diabetes Maternal Grandmother   . Heart disease Maternal Grandmother   . Diabetes Maternal Grandfather   . Cancer Maternal Grandfather        Colon  . Breast cancer Paternal Grandmother        Age 55    Social History   Socioeconomic History  . Marital status: Married    Spouse name: Not on file  . Number of children: Not on file  . Years of education: Not on file  . Highest education level: Not on file  Occupational History  . Not on file  Social Needs  . Financial resource strain: Not on file  . Food insecurity:    Worry: Not on file    Inability: Not on file   . Transportation needs:    Medical: Not on file    Non-medical: Not on file  Tobacco Use  . Smoking status: Former Smoker    Packs/day: 0.00    Types: Cigarettes    Last attempt to quit: 1965    Years since quitting: 54.7  . Smokeless tobacco: Never Used  . Tobacco comment: one pack of cigarettes would last two weeks-10/29/17  Substance and Sexual Activity  . Alcohol use: Yes    Alcohol/week: 2.0 standard drinks    Types: 2 Standard drinks or equivalent per week    Comment: occ  . Drug use: No  . Sexual activity: Yes    Birth control/protection: Post-menopausal, Surgical    Comment: 1st intercourse 72 yo-More than 5 partners  Lifestyle  . Physical activity:    Days per week: Not on file    Minutes per session: Not on file  . Stress: Not on file  Relationships  . Social connections:    Talks on phone: Not on file    Gets together: Not on file    Attends religious service: Not on file    Active member of club or organization: Not on file    Attends meetings of clubs or organizations: Not on file    Relationship status: Not on file  . Intimate partner violence:    Fear of current or ex partner: Not on file    Emotionally abused: Not on file    Physically abused: Not on file    Forced sexual activity: Not on file  Other Topics Concern  . Not on file  Social History Narrative  . Not on file    Review of systems: Review of Systems  Constitutional: Negative for fever and chills.  HENT: Negative.   Eyes: Negative for blurred vision.  Respiratory: as per HPI  Cardiovascular: Negative for chest pain and palpitations.  Gastrointestinal: Negative for vomiting, diarrhea, blood per rectum. Genitourinary: Negative for dysuria, urgency, frequency and hematuria.  Musculoskeletal: Negative for myalgias, back pain and joint pain.  Skin: Negative for itching and rash.  Neurological: Negative for dizziness, tremors, focal weakness, seizures and loss of consciousness.    Endo/Heme/Allergies: Negative for environmental allergies.  Psychiatric/Behavioral: Negative for depression, suicidal ideas and hallucinations.  All other systems reviewed and are negative.  Physical Exam: Blood pressure 120/80, pulse 73, height 5\' 1"  (1.549 m), weight 147 lb (66.7 kg), SpO2 96 %. Gen:      No acute distress HEENT:  EOMI, sclera anicteric Neck:     No masses; no thyromegaly Lungs:    Clear to auscultation bilaterally; normal respiratory effort CV:  Regular rate and rhythm; no murmurs Abd:      + bowel sounds; soft, non-tender; no palpable masses, no distension Ext:    No edema; adequate peripheral perfusion Skin:      Warm and dry; no rash Neuro: alert and oriented x 3 Psych: normal mood and affect  Data Reviewed: Imaging: CT chest 04/09/2007-visualized lung bases are normal. Chest x-ray 06/01/2015-clear lungs, no acute cardiopulmonary abnormality. I have reviewed the images personally.  PFTs:  FENO 10/29/2017-18  Labs: CBC 3/12/9-WBC 10.1, eos 2%, absolute eosinophil count 204  Assessment:  Evaluation for chronic cough Suspect upper airway cough from postnasal drip, possibly silent acid reflux  For postnasal drip we will start chlorpheniramine 8 mg 3 times daily.  Cautioned her that she may feel sleepy with this medication.  She will also start Flonase nasal spray Start Prilosec for empiric treatment of GERD Schedule pulmonary function test, chest x-ray, CBC differential includes allergy profile to evaluate for asthma  Plan/Recommendations: - Chlorphentermine, Flonase - Prilosec - CBC, blood allergy profile, PFTs, chest x-ray  Marshell Garfinkel MD [facility] 10/29/2017, 10:38 AM  CC: Seward Brice, MD

## 2017-10-30 LAB — RESPIRATORY ALLERGY PROFILE REGION II ~~LOC~~
Allergen, Cedar tree, t12: <0.1 kU/L
Allergen, Mouse Urine Protein, e78: <0.1 kU/L
Allergen, Mulberry, t76: <0.1 kU/L
Allergen, Oak,t7: <0.1 kU/L
Allergen, P. notatum, m1: <0.1 kU/L
Aspergillus fumigatus, m3: <0.1 kU/L
CLADOSPORIUM HERBARUM (M2) IGE: <0.1 kU/L
CLASS: 0
CLASS: 0
CLASS: 0
CLASS: 0
CLASS: 0
CLASS: 0
CLASS: 0
CLASS: 0
CLASS: 0
Cat Dander: <0.1 kU/L
Class: 0
Class: 0
Class: 0
Class: 0
Class: 0
Class: 0
Class: 0
Class: 0
Class: 0
Class: 0
Class: 0
Class: 0
Class: 0
Class: 0
Class: 0
Cockroach: <0.1 kU/L
IGE (IMMUNOGLOBULIN E), SERUM: 31 kU/L (ref <=114)
Pecan/Hickory Tree IgE: <0.1 kU/L
Rough Pigweed  IgE: <0.1 kU/L
Timothy Grass: <0.1 kU/L

## 2017-10-30 LAB — INTERPRETATION:

## 2017-11-24 ENCOUNTER — Encounter: Payer: Self-pay | Admitting: Pulmonary Disease

## 2017-11-24 ENCOUNTER — Ambulatory Visit (INDEPENDENT_AMBULATORY_CARE_PROVIDER_SITE_OTHER): Payer: Medicare Other | Admitting: Pulmonary Disease

## 2017-11-24 ENCOUNTER — Ambulatory Visit: Payer: Medicare Other | Admitting: Pulmonary Disease

## 2017-11-24 VITALS — BP 126/74 | HR 80 | Ht 61.5 in | Wt 147.0 lb

## 2017-11-24 DIAGNOSIS — R05 Cough: Secondary | ICD-10-CM

## 2017-11-24 DIAGNOSIS — R059 Cough, unspecified: Secondary | ICD-10-CM

## 2017-11-24 LAB — PULMONARY FUNCTION TEST
DL/VA % PRED: 85 %
DL/VA: 3.82 ml/min/mmHg/L
DLCO COR % PRED: 79 %
DLCO COR: 16.69 ml/min/mmHg
DLCO unc % pred: 80 %
DLCO unc: 16.79 ml/min/mmHg
FEF 25-75 POST: 1.78 L/s
FEF 25-75 Pre: 0.64 L/sec
FEF2575-%Change-Post: 178 %
FEF2575-%PRED-PRE: 38 %
FEF2575-%Pred-Post: 107 %
FEV1-%CHANGE-POST: 23 %
FEV1-%Pred-Post: 96 %
FEV1-%Pred-Pre: 77 %
FEV1-POST: 1.9 L
FEV1-Pre: 1.53 L
FEV1FVC-%CHANGE-POST: 10 %
FEV1FVC-%PRED-PRE: 86 %
FEV6-%Change-Post: 14 %
FEV6-%Pred-Post: 105 %
FEV6-%Pred-Pre: 91 %
FEV6-POST: 2.63 L
FEV6-Pre: 2.29 L
FEV6FVC-%Change-Post: 2 %
FEV6FVC-%Pred-Post: 105 %
FEV6FVC-%Pred-Pre: 102 %
FVC-%CHANGE-POST: 11 %
FVC-%PRED-POST: 100 %
FVC-%PRED-PRE: 89 %
FVC-POST: 2.63 L
FVC-PRE: 2.35 L
PRE FEV1/FVC RATIO: 65 %
PRE FEV6/FVC RATIO: 98 %
Post FEV1/FVC ratio: 72 %
Post FEV6/FVC ratio: 100 %
RV % pred: 132 %
RV: 2.8 L
TLC % PRED: 115 %
TLC: 5.42 L

## 2017-11-24 LAB — NITRIC OXIDE: Nitric Oxide: 23

## 2017-11-24 MED ORDER — FLUTICASONE FUROATE-VILANTEROL 200-25 MCG/INH IN AEPB: 1.0000 | INHALATION_SPRAY | Freq: Every day | RESPIRATORY_TRACT | 5 refills | Status: AC

## 2017-11-24 MED ORDER — FLUTICASONE FUROATE-VILANTEROL 200-25 MCG/INH IN AEPB: 1.0000 | INHALATION_SPRAY | Freq: Every day | RESPIRATORY_TRACT | 0 refills | Status: AC

## 2017-11-24 NOTE — Addendum Note (Signed)
Addended by: Maryanna Shape A on: 11/24/2017 10:30 AM   Modules accepted: Orders

## 2017-11-24 NOTE — Patient Instructions (Signed)
I am glad your cough is better Your blood tests are normal including allergy testing for dogs which is good news Your pulmonary function test however show that you may have asthma We will give a trial of Breo 200 and send in a prescription I will see you back in clinic in 3 months to reevaluate.

## 2017-11-24 NOTE — Progress Notes (Signed)
PFT done today. 

## 2017-11-24 NOTE — Progress Notes (Signed)
Diana Cisneros    [id number]    1945-07-05  Primary Care Physician:Polite, Jori Moll, MD  Referring Physician: Seward Jimesha, MD [address] [place], [place] [address]  Chief complaint: Follow-up for cough, asthma  HPI: 72 year old with history of allergic rhinitis, hyperlipidemia.  Complains of chronic cough for the past 1 to 2 months associated with clear to white mucus.  She has minimal dyspnea on exertion.  No symptoms at rest.  No wheezing, fevers, chills Cough is exacerbated on tilting her neck back and lying down.  She has history of allergic rhinitis, postnasal drip and takes loratadine at night.  No symptoms of GERD.  Pets: Has a dog and a cat. 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  No recent exposure.  No dampness, Jacuzzi, hot tub. Smoking history: Minimal smoking as a teenager. Travel history: Originally from [place].  Lived in [place].  No significant recent travel. Relevant family history: No significant family history of lung disease.  Interim history: Cough is much improved with chlorpheniramine.  She is now using the antihistamine once a day since she is feeling better Denies any dyspnea, wheezing.  Outpatient Encounter Medications as of 11/24/2017  Medication Sig  . Ascorbic Acid (VITAMIN C) 100 MG tablet Take 100 mg by mouth daily.    Marland Kitchen augmented betamethasone dipropionate (DIPROLENE-AF) 0.05 % cream APPLY TO AFFECTED AREA AS NEEDED  . Calcium Carbonate-Vitamin D (CALCIUM + D PO) Take 1 tablet by mouth daily.   . diazepam (VALIUM) 5 MG tablet Take 5 mg by mouth daily as needed.  . fish oil-omega-3 fatty acids 1000 MG capsule Take 2 g by mouth daily.    Marland Kitchen gabapentin (NEURONTIN) 100 MG capsule Take 100-300 mg by mouth at bedtime as needed (pain or insomnia).  Marland Kitchen glucosamine-chondroitin 500-400 MG tablet Take 1 tablet by mouth  daily.    Marland Kitchen loratadine (CLARITIN) 10 MG tablet Take 10 mg by mouth daily.  . meloxicam (MOBIC) 15 MG tablet Take 7.5 mg by mouth daily.  Marland Kitchen Specialty Vitamins Products (MAGNESIUM, AMINO ACID CHELATE,) 133 MG tablet Take 1 tablet by mouth daily.    Marland Kitchen VITAMIN D, CHOLECALCIFEROL, PO Take 1 tablet by mouth daily.    No facility-administered encounter medications on file as of 11/24/2017.    Physical Exam: Blood pressure 126/74, pulse 80, height 5' 1.5" (1.562 m), weight 147 lb (66.7 kg), SpO2 97 %. Gen:      No acute distress HEENT:  EOMI, sclera anicteric Neck:     No masses; no thyromegaly Lungs:    Clear to auscultation bilaterally; normal respiratory effort CV:         Regular rate and rhythm; no murmurs Abd:      + bowel sounds; soft, non-tender; no palpable masses, no distension Ext:    No edema; adequate peripheral perfusion Skin:      Warm and dry; no rash Neuro: alert and oriented x 3 Psych: normal mood and affect  Data Reviewed: Imaging: CT chest 04/09/2007-visualized lung bases are normal. Chest x-ray 06/01/2015-clear lungs, no acute cardiopulmonary abnormality. Chest x-ray 10/29/2017- no active lung disease, chronic scarring the right middle lobe and lingula. I have reviewed the images personally.  PFTs: 11/24/2017 FVC 2.35 [89%), FEV1 1.53 [77%), F/F 65 post FEV1 1.90 [96%, +23%), TLC 115%, DLCO 18% Moderate obstruction with significant  bronchodilator response and air trapping.  FENO 10/29/2017-18 FENO 11/24/2017-23  Labs: CBC 10/29/2017-WBC 5.2, eos 2.8%, absolute eosinophil count 146 Allergy profile 10/29/2017-IgE 31, RAST panel-negative  Assessment:  Follow-up for chronic cough Suspect upper airway cough from postnasal drip, possibly silent acid reflux Symptoms improved with chlorpheniramine, Prilosec  Asthma PFTs reviewed with obstruction and significant bronchodilator response Suspect she has T2 low asthma.  No evidence of peripheral eosinophilia or elevated IgE  or FENO. Although she not very symptomatic I am impressed with the magnitude of increase in flow rates post albuterol We will give her a trial of Breo 200 for a few months and assess response to therapy  Abnormal chest x-ray X-ray shows some chronic scarring which may be from prior pneumonia There is no evidence of restriction, diffusion impairment on PFTs.   Suspicion for interstitial lung disease is low.  We will continue to monitor.  Plan/Recommendations: - Chlorphentermine, Flonase - Prilosec - Start Breo 200  Marshell Garfinkel MD [facility] 11/24/2017, 10:08 AM  CC: Seward Lizzett, MD

## 2017-12-02 ENCOUNTER — Telehealth: Payer: Self-pay | Admitting: Pulmonary Disease

## 2017-12-02 ENCOUNTER — Ambulatory Visit: Payer: Self-pay | Admitting: Pulmonary Disease

## 2017-12-02 NOTE — Telephone Encounter (Signed)
Called and spoke with patient, she was under the impression that she could get monthly boxes for free of the prescription from [place]. Advised patient that we do not provide samples by the month for patients. Patient verbalized understanding. Patient stated she will go pick up her prescription and denied patient assistance. Nothing further needed.

## 2018-02-24 ENCOUNTER — Ambulatory Visit: Payer: Medicare Other | Admitting: Pulmonary Disease

## 2018-02-25 ENCOUNTER — Ambulatory Visit: Payer: Medicare Other | Admitting: Pulmonary Disease

## 2018-02-25 ENCOUNTER — Encounter: Payer: Self-pay | Admitting: Pulmonary Disease

## 2018-02-25 VITALS — BP 138/80 | HR 82 | Ht 62.0 in | Wt 147.0 lb

## 2018-02-25 DIAGNOSIS — J4540 Moderate persistent asthma, uncomplicated: Secondary | ICD-10-CM

## 2018-02-25 DIAGNOSIS — R05 Cough: Secondary | ICD-10-CM

## 2018-02-25 DIAGNOSIS — R059 Cough, unspecified: Secondary | ICD-10-CM

## 2018-02-25 HISTORY — DX: Moderate persistent asthma, uncomplicated: J45.40

## 2018-02-25 NOTE — Progress Notes (Signed)
Diana Cisneros    [id number]    1945/02/12  Primary Care Physician:Polite, Jori Moll, MD  Referring Physician: Seward Sheronica, MD [address] [place], [place] [address]  Chief complaint: Follow-up for cough, asthma  HPI: 73 year old with history of allergic rhinitis, hyperlipidemia.  Complains of chronic cough for the past 1 to 2 months associated with clear to white mucus.  She has minimal dyspnea on exertion.  No symptoms at rest.  No wheezing, fevers, chills Cough is exacerbated on tilting her neck back and lying down.  She has history of allergic rhinitis, postnasal drip and takes loratadine at night.  No symptoms of GERD.  Pets: Has a dog and a cat. 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  No recent exposure.  No dampness, Jacuzzi, hot tub. Smoking history: Minimal smoking as a teenager. Travel history: Originally from [place] [employer] [place].  Lived in [place].  No significant recent travel. Relevant family history: No significant family history of lung disease.  Interim history: Cough and breathing is much improved with Breo.  She is stopped using the chlorphentermine since symptoms are much better.  She intends on using the antihistamines as needed during allergy season No new complaints today.  Outpatient Encounter Medications as of 02/25/2018  Medication Sig  . Ascorbic Acid (VITAMIN C) 100 MG tablet Take 100 mg by mouth daily.    Marland Kitchen augmented betamethasone dipropionate (DIPROLENE-AF) 0.05 % cream APPLY TO AFFECTED AREA AS NEEDED  . BREO ELLIPTA 200-25 MCG/INH AEPB 1 puff daily.  . Calcium Carbonate-Vitamin D (CALCIUM + D PO) Take 1 tablet by mouth daily.   . chlorpheniramine (ALLERGY RELIEF) 4 MG tablet Take 4 mg by mouth 2 (two) times daily as needed for allergies.  . diazepam (VALIUM) 5 MG tablet Take 5 mg by mouth daily as needed.  . fish  oil-omega-3 fatty acids 1000 MG capsule Take 2 g by mouth daily.    Marland Kitchen gabapentin (NEURONTIN) 100 MG capsule Take 100-300 mg by mouth at bedtime as needed (pain or insomnia).  Marland Kitchen glucosamine-chondroitin 500-400 MG tablet Take 1 tablet by mouth daily.    . meloxicam (MOBIC) 15 MG tablet Take 7.5 mg by mouth daily.  Marland Kitchen Specialty Vitamins Products (MAGNESIUM, AMINO ACID CHELATE,) 133 MG tablet Take 1 tablet by mouth daily.    Marland Kitchen VITAMIN D, CHOLECALCIFEROL, PO Take 1 tablet by mouth daily.   . [DISCONTINUED] loratadine (CLARITIN) 10 MG tablet Take 10 mg by mouth daily.   No facility-administered encounter medications on file as of 02/25/2018.    Physical Exam: Blood pressure 138/80, pulse 82, height 5\' 2"  (1.575 m), weight 147 lb (66.7 kg), SpO2 98 %. Gen:      No acute distress HEENT:  EOMI, sclera anicteric Neck:     No masses; no thyromegaly Lungs:    Clear to auscultation bilaterally; normal respiratory effort CV:         Regular rate and rhythm; no murmurs Abd:      + bowel sounds; soft, non-tender; no palpable masses, no distension Ext:    No edema; adequate peripheral perfusion Skin:      Warm and dry; no rash Neuro: alert and oriented x 3 Psych: normal mood and affect  Data Reviewed: Imaging: CT chest 04/09/2007-visualized lung bases are normal. Chest x-ray 06/01/2015-clear lungs, no acute cardiopulmonary abnormality. Chest x-ray  10/29/2017- no active lung disease, chronic scarring the right middle lobe and lingula. I have reviewed the images personally.  PFTs: 11/24/2017 FVC 2.35 [89%), FEV1 1.53 [77%), F/F 65 post FEV1 1.90 [96%, +23%), TLC 115%, DLCO 18% Moderate obstruction with significant bronchodilator response and air trapping.  FENO 10/29/2017-18 FENO 11/24/2017-23  Labs: CBC 10/29/2017-WBC 5.2, eos 2.8%, absolute eosinophil count 146 Allergy profile 10/29/2017-IgE 31, RAST panel-negative  Assessment:  Follow-up for chronic cough Suspect upper airway cough from postnasal  drip, possibly silent acid reflux Symptoms improved with chlorpheniramine, Prilosec  Asthma PFTs reviewed with obstruction and significant bronchodilator response Suspect she has T2 low asthma.  No evidence of peripheral eosinophilia or elevated IgE or FENO. Although she not very symptomatic I am impressed with the magnitude of increase in flow rates post albuterol He has responded well to [facility].  Continue current therapy.  Abnormal chest x-ray X-ray shows some chronic scarring which may be from prior pneumonia There is no evidence of restriction, diffusion impairment on PFTs.   Suspicion for interstitial lung disease is low.  We will continue to monitor.  Plan/Recommendations: - Chlorphentermine PRN, Flonase - Prilosec - Breo 200  Marshell Garfinkel MD [facility] 02/25/2018, 9:17 AM  CC: Seward Dama, MD

## 2018-02-25 NOTE — Patient Instructions (Signed)
I am glad you are doing better with the breathing on Breo Continue the inhalers as prescribed You can use a chlorphentermine as needed during pollen and allergy seasons  Up in 6 months.  Please give [place] a call sooner if there is any change in his symptoms.

## 2018-03-25 NOTE — Progress Notes (Signed)
Error

## 2018-04-08 ENCOUNTER — Encounter: Payer: Self-pay | Admitting: Podiatry

## 2018-04-08 ENCOUNTER — Other Ambulatory Visit: Payer: Self-pay

## 2018-04-08 ENCOUNTER — Ambulatory Visit: Payer: Medicare Other | Admitting: Podiatry

## 2018-04-08 DIAGNOSIS — L84 Corns and callosities: Secondary | ICD-10-CM | POA: Diagnosis not present

## 2018-04-08 NOTE — Progress Notes (Signed)
This patient presents to the office saying the calluses have returned on both feet.  She says the calluses are painful walking and wearing her shoes.  She says she is very active.  She presents to the office for evaluation and treatment.  Objective.  Neurovascular status intact.  Pinch callus  B/L and porokeratosis sub 2 B/L and fifth toes  B/L  ROV.  Debride callus  Gardiner Barefoot DPM

## 2018-05-24 ENCOUNTER — Other Ambulatory Visit: Payer: Self-pay | Admitting: Pulmonary Disease

## 2018-06-08 ENCOUNTER — Other Ambulatory Visit: Payer: Self-pay | Admitting: Gynecology

## 2018-06-08 DIAGNOSIS — Z1231 Encounter for screening mammogram for malignant neoplasm of breast: Secondary | ICD-10-CM

## 2018-06-10 ENCOUNTER — Encounter: Payer: Medicare Other | Admitting: Gynecology

## 2018-07-23 ENCOUNTER — Encounter: Payer: Medicare Other | Admitting: Gynecology

## 2018-07-29 ENCOUNTER — Ambulatory Visit
Admission: RE | Admit: 2018-07-29 | Discharge: 2018-07-29 | Disposition: A | Discharge status: Home / Self Care | Payer: Medicare Other | Source: Ambulatory Visit | Attending: Gynecology | Admitting: Gynecology

## 2018-07-29 ENCOUNTER — Other Ambulatory Visit: Payer: Self-pay

## 2018-07-29 DIAGNOSIS — Z1231 Encounter for screening mammogram for malignant neoplasm of breast: Secondary | ICD-10-CM

## 2018-08-25 ENCOUNTER — Other Ambulatory Visit: Payer: Self-pay

## 2018-08-26 ENCOUNTER — Ambulatory Visit (INDEPENDENT_AMBULATORY_CARE_PROVIDER_SITE_OTHER): Payer: Medicare Other | Admitting: Gynecology

## 2018-08-26 ENCOUNTER — Encounter: Payer: Self-pay | Admitting: Gynecology

## 2018-08-26 VITALS — BP 132/78 | Ht 62.0 in | Wt 148.0 lb

## 2018-08-26 DIAGNOSIS — M8580 Other specified disorders of bone density and structure, unspecified site: Secondary | ICD-10-CM | POA: Diagnosis not present

## 2018-08-26 DIAGNOSIS — Z01419 Encounter for gynecological examination (general) (routine) without abnormal findings: Secondary | ICD-10-CM

## 2018-08-26 DIAGNOSIS — N952 Postmenopausal atrophic vaginitis: Secondary | ICD-10-CM | POA: Diagnosis not present

## 2018-08-26 NOTE — Progress Notes (Signed)
    Tavie Haseman [facility] 1945-03-08 [id number]        73 y.o.  G0P0 for annual gynecologic exam.  Without gynecologic complaints  Past medical history,surgical history, problem list, medications, allergies, family history and social history were all reviewed and documented as reviewed in the EPIC chart.  ROS:  Performed with pertinent positives and negatives included in the history, assessment and plan.   Additional significant findings : None   Exam: Wandra Scot assistant Vitals:   08/26/18 0957  BP: 132/78  Weight: 148 lb (67.1 kg)  Height: 5\' 2"  (1.575 m)   Body mass index is 27.07 kg/m.  General appearance:  Normal affect, orientation and appearance. Skin: Grossly normal HEENT: Without gross lesions.  No cervical or supraclavicular adenopathy. Thyroid normal.  Lungs:  Clear without wheezing, rales or rhonchi Cardiac: RR, without RMG Abdominal:  Soft, nontender, without masses, guarding, rebound, organomegaly or hernia Breasts:  Examined lying and sitting without masses, retractions, discharge or axillary adenopathy. Pelvic:  Ext, BUS, Vagina: With atrophic changes  Adnexa: Without masses or tenderness    Anus and perineum: Normal   Rectovaginal: Normal sphincter tone without palpated masses or tenderness.    Assessment/Plan:  73 y.o. G0P0 female for annual gynecologic exam.  Status post TAH LSO and subsequent RSO for endometriosis  1. Postmenopausal/atrophic genital changes.  No significant menopausal symptoms. 2. Osteopenia.  DEXA 2018 T score -1.1 FRAX 14% / 2.7%.  Options to repeat now versus waiting another year or 2 discussed and patient would like to go ahead and repeat it now at 2-year interval.  We will go ahead and schedule in follow-up for this. 3. Mammography 07/2018.  Continue with annual mammography next year.  Breast exam normal today. 4. Colonoscopy 2017.  Repeat at their recommended interval. 5. Pap smear 2012.  No Pap smear done today.  No history of  abnormal Pap smears.  We both agree to stop screening per current screening guidelines. 6. Health maintenance.  No routine lab work done as patient does this elsewhere.  Follow-up 1 year, sooner as needed.   Anastasio Auerbach MD, 10:19 AM 08/26/2018

## 2018-08-26 NOTE — Patient Instructions (Signed)
Follow-up for the bone density as scheduled.  Follow-up in 1 year for annual exam.

## 2018-08-28 ENCOUNTER — Other Ambulatory Visit: Payer: Self-pay

## 2018-08-28 ENCOUNTER — Encounter: Payer: Self-pay | Admitting: Podiatry

## 2018-08-28 ENCOUNTER — Ambulatory Visit: Payer: Medicare Other | Admitting: Podiatry

## 2018-08-28 VITALS — Temp 97.6°F

## 2018-08-28 DIAGNOSIS — L84 Corns and callosities: Secondary | ICD-10-CM

## 2018-08-28 NOTE — Progress Notes (Signed)
This patient presents the office stating the calluses have returned and are painful walking and wearing her shoes she says she is very active and attempts to keep the calluses trimmed herself.  She presents the office today for preventative foot care services.    Vascular  Dorsalis pedis and posterior tibial pulses are palpable  B/L.  Capillary return  WNL.  Temperature gradient is  WNL.  Skin turgor  WNL  Sensorium  Senn Weinstein monofilament wire  WNL. Normal tactile sensation.  Nail Exam  Patient has normal nails with no evidence of bacterial or fungal infection.  Orthopedic  Exam  Muscle tone and muscle strength  WNL.  No limitations of motion feet  B/L.  No crepitus or joint effusion noted.  Foot type is unremarkable and digits show no abnormalities.  Hypermobile  HAV  B/L. Hammer toes 5th  B/l  Skin  No open lesions.  Normal skin texture and turgor.Marland Kitchen Heloma durum fifth toe  B/l.  Porokeratosis sub 5th met  Right foot.  Callus  B/l  Debride callus/corn.  Discussed the pathology in her feet which cause the callus to return.   Gardiner Barefoot DPM

## 2018-10-29 DIAGNOSIS — M8580 Other specified disorders of bone density and structure, unspecified site: Secondary | ICD-10-CM

## 2018-10-29 HISTORY — DX: Other specified disorders of bone density and structure, unspecified site: M85.80

## 2018-11-04 ENCOUNTER — Encounter: Payer: Self-pay | Admitting: Gynecology

## 2018-11-10 ENCOUNTER — Other Ambulatory Visit: Payer: Self-pay

## 2018-11-11 ENCOUNTER — Telehealth: Payer: Self-pay | Admitting: Gynecology

## 2018-11-11 ENCOUNTER — Other Ambulatory Visit: Payer: Self-pay | Admitting: Gynecology

## 2018-11-11 ENCOUNTER — Encounter: Payer: Self-pay | Admitting: Gynecology

## 2018-11-11 ENCOUNTER — Ambulatory Visit (INDEPENDENT_AMBULATORY_CARE_PROVIDER_SITE_OTHER): Payer: Medicare Other

## 2018-11-11 DIAGNOSIS — Z780 Asymptomatic menopausal state: Secondary | ICD-10-CM

## 2018-11-11 DIAGNOSIS — M8589 Other specified disorders of bone density and structure, multiple sites: Secondary | ICD-10-CM | POA: Diagnosis not present

## 2018-11-11 DIAGNOSIS — M8580 Other specified disorders of bone density and structure, unspecified site: Secondary | ICD-10-CM

## 2018-11-11 NOTE — Telephone Encounter (Signed)
Patient informed. 

## 2018-11-11 NOTE — Telephone Encounter (Signed)
Tell patient her most recent bone density continues to show osteopenia which is some loss of calcium from the bones but not to the extent of osteoporosis.  We do a theoretical fracture risk calculation and she does have a higher risk of hip fracture at 7% over the next 10 years.  Has a overall fracture risk of breaking any bone at 18% over the next 10 years.  Whenever your hip fracture risk exceeds 3% they recommend discussing possible medication.  I would recommend patient schedule an office visit to discuss medication options.

## 2018-11-13 ENCOUNTER — Other Ambulatory Visit: Payer: Self-pay

## 2018-11-16 ENCOUNTER — Other Ambulatory Visit: Payer: Self-pay | Admitting: Gynecology

## 2018-11-16 ENCOUNTER — Encounter: Payer: Self-pay | Admitting: Gynecology

## 2018-11-16 ENCOUNTER — Other Ambulatory Visit: Payer: Self-pay

## 2018-11-16 ENCOUNTER — Ambulatory Visit (INDEPENDENT_AMBULATORY_CARE_PROVIDER_SITE_OTHER): Payer: Medicare Other | Admitting: Gynecology

## 2018-11-16 DIAGNOSIS — M8580 Other specified disorders of bone density and structure, unspecified site: Secondary | ICD-10-CM

## 2018-11-16 DIAGNOSIS — M8589 Other specified disorders of bone density and structure, multiple sites: Secondary | ICD-10-CM

## 2018-11-16 NOTE — Patient Instructions (Addendum)
Follow-up for the vitamin D level.  Call if you decide you want to start on medication such as Fosamax or Actonel

## 2018-11-16 NOTE — Progress Notes (Signed)
    Diana Cisneros [facility] 07/14/1945 OR:[id number]        73 y.o.  G0P0 presents for a telemedicine telephone consult in reference to her recent DEXA.  She was identified by 2 independent identifiers.  She is in her car outside of a vet office and I am in my office.  Past medical history,surgical history, problem list, medications, allergies, family history and social history were all reviewed and documented in the EPIC chart.  Directed ROS with pertinent positives and negatives documented in the history of present illness/assessment and plan.  Exam: General appearance:  Normal   Assessment/Plan:  73 y.o. G0P0 with recent DEXA showing T score -2.2 distal third of the forearm.  We reviewed her DEXA report in detail showing a 3.7% statistically significant decline at the right total hip compared to her 2018 study.  Stable at the AP spine and left total hip.  7.9% decline at the left femoral neck and 6.2% decline at the right femoral neck both statistically significant when compared to the 2018 study.  FRAX calculation 18% major osteoporotic and 6.7% hip.  We discussed her increased hip FRAX exceeding the 3% recommended treatment discussion limit.  We discussed osteopenia versus osteoporosis and increased fracture risk.  We discussed adequate calcium and vitamin D intake.  I did recommend she have a vitamin D level checked and she is going to come by the office to do so.  We discussed medication options to include the bisphosphate's such as Fosamax Actonel Boniva.  Risks to include osteonecrosis of the jaw and atypical fractures as well as GERD reviewed.  How to take the medication was also discussed.  At this point the patient would like to check her vitamin D level and repeat the study in 2 years.  She will think about the medications and call if she changes her mind and would like to start.  We also discussed Prolia but given the diagnosis of osteopenia I would suggest starting with a bisphosphate and as  long as she can tolerate them then continue.  A copy of the DEXA report was mailed to the patient.  22 minutes time spent in direct communication with the patient.    Anastasio Auerbach MD, 11:26 AM 11/16/2018

## 2018-11-18 ENCOUNTER — Other Ambulatory Visit: Payer: Self-pay

## 2018-11-19 ENCOUNTER — Other Ambulatory Visit: Payer: Medicare Other

## 2018-11-19 DIAGNOSIS — M8580 Other specified disorders of bone density and structure, unspecified site: Secondary | ICD-10-CM

## 2018-11-20 ENCOUNTER — Other Ambulatory Visit: Payer: Self-pay | Admitting: *Deleted

## 2018-11-20 DIAGNOSIS — E559 Vitamin D deficiency, unspecified: Secondary | ICD-10-CM

## 2018-11-20 LAB — VITAMIN D 25 HYDROXY (VIT D DEFICIENCY, FRACTURES): Vit D, 25-Hydroxy: 23 ng/mL — ABNORMAL LOW (ref 30–100)

## 2018-11-20 MED ORDER — VITAMIN D (ERGOCALCIFEROL) 1.25 MG (50000 UNIT) PO CAPS: ORAL_CAPSULE | ORAL | 0 refills | Status: DC

## 2018-12-16 ENCOUNTER — Other Ambulatory Visit: Payer: Self-pay | Admitting: Pulmonary Disease

## 2018-12-30 ENCOUNTER — Encounter: Payer: Self-pay | Admitting: Podiatry

## 2018-12-30 ENCOUNTER — Ambulatory Visit: Payer: Medicare Other | Admitting: Podiatry

## 2018-12-30 ENCOUNTER — Other Ambulatory Visit: Payer: Self-pay

## 2018-12-30 DIAGNOSIS — L84 Corns and callosities: Secondary | ICD-10-CM

## 2018-12-30 DIAGNOSIS — L603 Nail dystrophy: Secondary | ICD-10-CM

## 2018-12-30 HISTORY — DX: Nail dystrophy: L60.3

## 2018-12-30 NOTE — Progress Notes (Signed)
This patient presents the office stating the calluses have returned and are painful walking and wearing her shoes she says she is very active and attempts to keep the calluses trimmed herself.  She presents the office today for preventative foot care services.  She says she is experiencing pain in both second toes and cannot cut his toenails.    Vascular  Dorsalis pedis and posterior tibial pulses are palpable  B/L.  Capillary return  WNL.  Temperature gradient is  WNL.  Skin turgor  WNL  Sensorium  Senn Weinstein monofilament wire  WNL. Normal tactile sensation.  Nail Exam  Patient has normal nails with no evidence of bacterial or fungal infection. Thickened disfigured second toenails  B/L.  Orthopedic  Exam  Muscle tone and muscle strength  WNL.  No limitations of motion feet  B/L.  No crepitus or joint effusion noted.  Foot type is unremarkable and digits show no abnormalities.  Hypermobile  HAV  B/L. Hammer toes 5th  B/l  Skin  No open lesions.  Normal skin texture and turgor.. Heloma durum fifth toe  B/l.  Porokeratosis sub 5th met  Right foot.  Porokeratosis sub 2 left foot.    Callus  B/l  Debride callus/corn.   Debride nails  X 2.   Discussed the pathology in her feet which cause the callus to return.   Gregory Mayer DPM 

## 2019-02-08 ENCOUNTER — Other Ambulatory Visit: Payer: Self-pay

## 2019-02-19 ENCOUNTER — Ambulatory Visit: Payer: Medicare Other | Attending: Internal Medicine

## 2019-02-19 ENCOUNTER — Other Ambulatory Visit: Payer: Medicare Other

## 2019-02-19 DIAGNOSIS — Z23 Encounter for immunization: Secondary | ICD-10-CM

## 2019-02-22 ENCOUNTER — Encounter: Payer: Self-pay | Admitting: Pulmonary Disease

## 2019-02-22 ENCOUNTER — Other Ambulatory Visit: Payer: Self-pay

## 2019-02-22 ENCOUNTER — Ambulatory Visit (INDEPENDENT_AMBULATORY_CARE_PROVIDER_SITE_OTHER): Payer: Medicare Other | Admitting: Pulmonary Disease

## 2019-02-22 DIAGNOSIS — J4540 Moderate persistent asthma, uncomplicated: Secondary | ICD-10-CM

## 2019-02-22 MED ORDER — BUDESONIDE-FORMOTEROL FUMARATE 160-4.5 MCG/ACT IN AERO: 2.0000 | INHALATION_SPRAY | Freq: Two times a day (BID) | RESPIRATORY_TRACT | 3 refills | Status: DC

## 2019-02-22 NOTE — Patient Instructions (Signed)
We will stop the [facility] Symbicort 160 twice daily Follow-up in 3 months.

## 2019-02-22 NOTE — Progress Notes (Signed)
Virtual Visit via Telephone Note  I connected with Diana Cisneros on 02/22/19 at  8:45 AM EST by telephone and verified that I am speaking with the correct person using two identifiers.  Location: Patient: Home Provider: [facility], [place], [place]   I discussed the limitations, risks, security and privacy concerns of performing an evaluation and management service by telephone and the availability of in person appointments. I also discussed with the patient that there may be a patient responsible charge related to this service. The patient expressed understanding and agreed to proceed.   History of Present Illness: 74 year old with history of allergic rhinitis, hyperlipidemia.  With moderate persistent asthma.  Has been maintained on Breo for the past year with improvement in symptoms.  Observations/Objective: Reports leg and body cramping when she uses the Breo.  Her primary care had checked her electrolytes which were normal and no other explanation for her symptoms When she uses the [facility] every other day the symptoms improved but her breathing worsens with increasing chest congestion and dyspnea She is inquiring about alternate inhaler  Assessment and Plan: Persistent asthma Has side effects with Breo with symptoms of leg and body cramping.  Will try Symbicort 160 twice daily She may have similar effects with this inhaler as well as it does have same class of medication If side effects are persistent then we can de-escalate her to just inhaled corticosteroids and Laurence Ferrari as needed  Follow Up Instructions: We will stop the [facility] Symbicort 160 twice daily Follow-up in 3 months.   I discussed the assessment and treatment plan with the patient. The patient was provided an opportunity to ask questions and all were answered. The patient agreed with the plan and demonstrated an understanding of the instructions.   The patient was advised to call back or  seek an in-person evaluation if the symptoms worsen or if the condition fails to improve as anticipated.  I provided 25 minutes of non-face-to-face time during this encounter.   Marshell Garfinkel MD [facility] 02/22/2019, 8:52 AM

## 2019-02-23 ENCOUNTER — Other Ambulatory Visit: Payer: Self-pay

## 2019-02-23 ENCOUNTER — Other Ambulatory Visit: Payer: Medicare Other

## 2019-02-23 DIAGNOSIS — E559 Vitamin D deficiency, unspecified: Secondary | ICD-10-CM

## 2019-02-24 LAB — VITAMIN D 25 HYDROXY (VIT D DEFICIENCY, FRACTURES): Vit D, 25-Hydroxy: 60 ng/mL (ref 30–100)

## 2019-03-03 NOTE — Progress Notes (Signed)
I would continue on 1500-2000 IU vitamin D per day.

## 2019-03-11 ENCOUNTER — Ambulatory Visit: Payer: Medicare Other | Attending: Internal Medicine

## 2019-03-11 DIAGNOSIS — Z23 Encounter for immunization: Secondary | ICD-10-CM | POA: Insufficient documentation

## 2019-03-11 NOTE — Progress Notes (Signed)
   Covid-19 Vaccination Clinic  Name:  Diana Cisneros    MRN: [id number] DOB: Mar 01, 1945  03/11/2019  Diana Cisneros was observed post Covid-19 immunization for 15 minutes without incidence. She was provided with Vaccine Information Sheet and instruction to access the V-Safe system.   Diana Cisneros was instructed to call 911 with any severe reactions post vaccine: Marland Kitchen Difficulty breathing  . Swelling of your face and throat  . A fast heartbeat  . A bad rash all over your body  . Dizziness and weakness    Immunizations Administered    Name Date Dose VIS Date Route   Pfizer COVID-19 Vaccine 03/11/2019  8:37 AM 0.3 mL 01/08/2019 Intramuscular   Manufacturer: [place]   Lot: [id number]   [place]: [id number]

## 2019-04-09 ENCOUNTER — Other Ambulatory Visit: Payer: Self-pay

## 2019-04-09 ENCOUNTER — Encounter: Payer: Self-pay | Admitting: Pulmonary Disease

## 2019-04-09 ENCOUNTER — Ambulatory Visit: Payer: Medicare Other | Admitting: Pulmonary Disease

## 2019-04-09 ENCOUNTER — Ambulatory Visit (INDEPENDENT_AMBULATORY_CARE_PROVIDER_SITE_OTHER): Payer: Medicare Other

## 2019-04-09 VITALS — BP 122/80 | HR 65 | Temp 97.8°F | Ht 62.0 in | Wt 139.4 lb

## 2019-04-09 DIAGNOSIS — J4540 Moderate persistent asthma, uncomplicated: Secondary | ICD-10-CM | POA: Diagnosis not present

## 2019-04-09 LAB — CBC WITH DIFFERENTIAL/PLATELET
Basophils Absolute: 0 10*3/uL (ref 0.0–0.1)
Basophils Relative: 0.6 % (ref 0.0–3.0)
Eosinophils Absolute: 0.1 10*3/uL (ref 0.0–0.7)
Eosinophils Relative: 2.1 % (ref 0.0–5.0)
HCT: 40.8 % (ref 36.0–46.0)
Hemoglobin: 13.4 g/dL (ref 12.0–15.0)
Lymphocytes Relative: 19.9 % (ref 12.0–46.0)
Lymphs Abs: 1.1 10*3/uL (ref 0.7–4.0)
MCHC: 32.8 g/dL (ref 30.0–36.0)
MCV: 89.9 fl (ref 78.0–100.0)
Monocytes Absolute: 0.4 10*3/uL (ref 0.1–1.0)
Monocytes Relative: 8.2 % (ref 3.0–12.0)
Neutro Abs: 3.7 10*3/uL (ref 1.4–7.7)
Neutrophils Relative %: 69.2 % (ref 43.0–77.0)
Platelets: 173 10*3/uL (ref 150.0–400.0)
RBC: 4.54 Mil/uL (ref 3.87–5.11)
RDW: 14.4 % (ref 11.5–15.5)
WBC: 5.4 10*3/uL (ref 4.0–10.5)

## 2019-04-09 NOTE — Progress Notes (Signed)
Diana Cisneros    [id number]    1945-02-13  Primary Care Physician:Polite, Jori Moll, MD  Referring Physician: Seward Ravinder, MD [address] [place],  [place] [address]  Chief complaint: Follow-up for cough, asthma  HPI: 74 year old with history of allergic rhinitis, hyperlipidemia.  Complains of chronic cough for the past 1 to 2 months associated with clear to white mucus.  She has minimal dyspnea on exertion.  No symptoms at rest.  No wheezing, fevers, chills Cough is exacerbated on tilting her neck back and lying down.  She has history of allergic rhinitis, postnasal drip and takes loratadine at night.  No symptoms of GERD.  Pets: Has a dog and a cat. 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  No recent exposure.  No dampness, Jacuzzi, hot tub. Smoking history: Minimal smoking as a teenager. Travel history: Originally from [place].  Lived in [place].  No significant recent travel. Relevant family history: No significant family history of lung disease.  Interim history: She is stopped using the chlorphentermine since symptoms are much better.  She intends on using the antihistamines as needed during allergy season  Complains of cramping sensation which she attributes to [facility] inhaler.  We had prescribed Symbicort at last televisit in January but she is still on the [facility] as she wanted to finish the inhaler she had at hand. She intends to switch to Symbicort later this week.    Complains of chronic dyspnea on exertion, unable to take a deep breath on inhalation.  Feels that her symptoms are worse compared to last year.  Outpatient Encounter Medications as of 04/09/2019  Medication Sig  . Ascorbic Acid (VITAMIN C) 100 MG tablet Take 100 mg by mouth daily.    Marland Kitchen augmented betamethasone dipropionate (DIPROLENE-AF) 0.05 % cream APPLY TO AFFECTED  AREA AS NEEDED  . BREO ELLIPTA 200-25 MCG/INH AEPB INHALE 1 PUFF INTO THE LUNGS DAILY FOR 1 DAY  . budesonide-formoterol (SYMBICORT) 160-4.5 MCG/ACT inhaler Inhale 2 puffs into the lungs 2 (two) times daily.  . Calcium Carbonate-Vitamin D (CALCIUM + D PO) Take 1 tablet by mouth daily.   . chlorpheniramine (ALLERGY RELIEF) 4 MG tablet Take 4 mg by mouth 2 (two) times daily as needed for allergies.  . diazepam (VALIUM) 5 MG tablet Take 5 mg by mouth daily as needed.  . diclofenac sodium (VOLTAREN) 1 % GEL APPLY 2 4 GRAMS 3 TO 4 TIMES DAILY  . fish oil-omega-3 fatty acids 1000 MG capsule Take 2 g by mouth daily.    Marland Kitchen gabapentin (NEURONTIN) 100 MG capsule Take 100-300 mg by mouth at bedtime as needed (pain or insomnia).  Marland Kitchen glucosamine-chondroitin 500-400 MG tablet Take 1 tablet by mouth daily.    . meloxicam (MOBIC) 15 MG tablet Take 7.5 mg by mouth daily.  . methocarbamol (ROBAXIN) 750 MG tablet Take 750 mg by mouth every 8 (eight) hours as needed.  Marland Kitchen Specialty Vitamins Products (MAGNESIUM, AMINO ACID CHELATE,) 133 MG tablet Take 1 tablet by mouth daily.    . traMADol (ULTRAM) 50 MG tablet TAKE 1 TO 2 TABLETS BY MOUTH 2 TO 3 TIMES DAILY AS NEEDED  . VITAMIN D, CHOLECALCIFEROL, PO Take 1 tablet by mouth daily.   . Vitamin D, Ergocalciferol, (DRISDOL) 1.25 MG (50000 UT) CAPS capsule Take one tablet by mouth once weekly for 12  weeks, then call office to schedule lab appointment to recheck vitamin d   No facility-administered encounter medications on file as of 04/09/2019.   Physical Exam: Blood pressure 122/80, pulse 65, temperature 97.8 F (36.6 C), temperature source Temporal, height 5\' 2"  (1.575 m), weight 139 lb 6.4 oz (63.2 kg), SpO2 96 %. Gen:      No acute distress HEENT:  EOMI, sclera anicteric Neck:     No masses; no thyromegaly Lungs:    Clear to auscultation bilaterally; normal respiratory effort CV:         Regular rate and rhythm; no murmurs Abd:      + bowel sounds; soft,  non-tender; no palpable masses, no distension Ext:    No edema; adequate peripheral perfusion Skin:      Warm and dry; no rash Neuro: alert and oriented x 3 Psych: normal mood and affect  Data Reviewed: Imaging: CT chest 04/09/2007-visualized lung bases are normal. Chest x-ray 06/01/2015-clear lungs, no acute cardiopulmonary abnormality. Chest x-ray 10/29/2017- no active lung disease, chronic scarring the right middle lobe and lingula. I have reviewed the images personally.  PFTs: 11/24/2017 FVC 2.35 [89%), FEV1 1.53 [77%), F/F 65 post FEV1 1.90 [96%, +23%), TLC 115%, DLCO 18% Moderate obstruction with significant bronchodilator response and air trapping.  FENO 10/29/2017-18 FENO 11/24/2017-23  Act score 04/09/2019-22  Labs: CBC 10/29/2017-WBC 5.2, eos 2.8%, absolute eosinophil count 146 Allergy profile 10/29/2017-IgE 31, RAST panel-negative  Assessment:  Follow-up for chronic cough Suspect upper airway cough from postnasal drip, possibly silent acid reflux Symptoms improved with chlorpheniramine, Prilosec  Asthma PFTs reviewed with obstruction and significant bronchodilator response No evidence elevated IgE or FENO, mild elevation in peripheral eosinophilia  Has side effects with Breo with symptoms of leg and body cramping.   Make the switch to Symbicort this week.  She will also use Symbicort as a rescue inhaler If symptoms continue with the new inhaler then we can consider Dupixent.  Recheck CBC, respiratory allergy profile  Abnormal chest x-ray X-ray shows some chronic scarring which may be from prior pneumonia There is no evidence of restriction, diffusion impairment on PFTs.   Suspicion for interstitial lung disease is low.  We will continue to monitor. Chest x-ray today  Plan/Recommendations: - Chlorphentermine PRN, Flonase - Prilosec - Start Symbicort instead of Breo - CBC, respiratory allergy profile, chest x-ray  Marshell Garfinkel MD [facility] 04/09/2019, 10:26 AM  CC: Seward Jearldine, MD

## 2019-04-09 NOTE — Addendum Note (Signed)
Addended by: Suzzanne Cloud E on: 04/09/2019 10:51 AM   Modules accepted: Orders

## 2019-04-09 NOTE — Patient Instructions (Signed)
We will check some labs today including CBC differential, respiratory allergy profile Start the Symbicort that he already have at home Chest x-ray today Follow-up in 3 months.

## 2019-04-12 LAB — RESPIRATORY ALLERGY PROFILE REGION II ~~LOC~~

## 2019-04-12 LAB — INTERPRETATION:

## 2019-04-19 NOTE — Progress Notes (Signed)
CARDIOLOGY CONSULT NOTE       Patient ID: Diana Cisneros MRN: [id number] DOB/AGE: [birth year] 74 y.o.  Admit date: (Not on file) Referring Physician: [place] Primary Physician: Seward Latreece, MD Primary Cardiologist: New Reason for Consultation: Chest Pain  Active Problems:   * No active hospital problems. *   HPI:  74 y.o. with bronchospastic lung disease seen by [facility], family history of CAD, severe unRx HLD. Intolerant to statins due to leg cramps on multiple ones. She is a friend of my next door neighbor and does [employer]. Up until a few months ago had been very active. Now has fatigue, dyspnea and chest pain that is progressive. Seen by [facility] 04/09/19 who changed her inhalers CXR showed NAD. No signs of CHF but more exertional dyspnea. Mild cough No fever or sputum SSCP is somewhat atypical sharp right /left sided sometimes at rest. Does not have nitro    He pain is usually non exertional and sharp no GI overtones   ROS All other systems reviewed and negative except as noted above  Past Medical History:  Diagnosis Date  . Allergic rhinitis   . Anxiety   . Anxiousness   . Degenerative disc disease    thoracic,lumbar  . Hypercholesterolemia   . Hyperlipidemia   . Low back pain   . Moderate persistent asthma without complication [id number]  . Muscle cramps   . Nail dystrophy 12/30/2018  . Osteopenia 10/2018   T score -2.2 distal third of forearm.  -1.6 left femoral neck FRAX 18% / 6.7%  . Seasonal allergies   . Sinusitis, acute   . URI (upper respiratory infection)     Family History  Problem Relation Age of Onset  . Heart disease Mother   . Stroke Father   . Diabetes Maternal Grandmother   . Heart disease Maternal Grandmother   . Diabetes Maternal Grandfather   . Cancer Maternal Grandfather        Colon  . Breast cancer Paternal Grandmother        Age 31    Social History   Socioeconomic History  . Marital status: Married    Spouse  name: Not on file  . Number of children: Not on file  . Years of education: Not on file  . Highest education level: Not on file  Occupational History  . Not on file  Tobacco Use  . Smoking status: Former Smoker    Packs/day: 0.00    Types: Cigarettes    Quit date: 1965    Years since quitting: 56.2  . Smokeless tobacco: Never Used  . Tobacco comment: one pack of cigarettes would last two weeks-10/29/17  Substance and Sexual Activity  . Alcohol use: Yes    Alcohol/week: 2.0 standard drinks    Types: 2 Standard drinks or equivalent per week    Comment: occ  . Drug use: No  . Sexual activity: Not Currently    Birth control/protection: Post-menopausal, Surgical    Comment: 1st intercourse 74 yo-More than 5 partners  Other Topics Concern  . Not on file  Social History Narrative  . Not on file   Social Determinants of Health   Financial Resource Strain:   . Difficulty of Paying Living Expenses:   Food Insecurity:   . Worried About Charity fundraiser in the Last Year:   . Arboriculturist in the Last Year:   Transportation Needs:   . Film/video editor (Medical):   Marland Kitchen Lack  of Transportation (Non-Medical):   Physical Activity:   . Days of Exercise per Week:   . Minutes of Exercise per Session:   Stress:   . Feeling of Stress :   Social Connections:   . Frequency of Communication with Friends and Family:   . Frequency of Social Gatherings with Friends and Family:   . Attends Religious Services:   . Active Member of Clubs or Organizations:   . Attends Archivist Meetings:   Marland Kitchen Marital Status:   Intimate Partner Violence:   . Fear of Current or Ex-Partner:   . Emotionally Abused:   Marland Kitchen Physically Abused:   . Sexually Abused:     Past Surgical History:  Procedure Laterality Date  . ABDOMINAL HYSTERECTOMY  1986   TAH  LSO  . APPENDECTOMY  2009  . BREAST EXCISIONAL BIOPSY     left 1995  . BREAST SURGERY     Biopsy benign  . COLONOSCOPY WITH PROPOFOL N/A  12/27/2014   Procedure: COLONOSCOPY WITH PROPOFOL;  Surgeon: Garlan Fair, MD;  Location: [facility];  Service: Endoscopy;  Laterality: N/A;  . KNEE SURGERY     arthroscopic  . OOPHORECTOMY     LSO 86,RSO 95  . PELVIC LAPAROSCOPY  1995   RSO      Current Outpatient Medications:  .  Ascorbic Acid (VITAMIN C) 100 MG tablet, Take 100 mg by mouth daily.  , Disp: , Rfl:  .  augmented betamethasone dipropionate (DIPROLENE-AF) 0.05 % cream, APPLY TO AFFECTED AREA AS NEEDED, Disp: 50 g, Rfl: 0 .  BREO ELLIPTA 200-25 MCG/INH AEPB, INHALE 1 PUFF INTO THE LUNGS DAILY FOR 1 DAY, Disp: 60 each, Rfl: 0 .  budesonide-formoterol (SYMBICORT) 160-4.5 MCG/ACT inhaler, Inhale 2 puffs into the lungs 2 (two) times daily., Disp: 1 Inhaler, Rfl: 3 .  Calcium Carbonate-Vitamin D (CALCIUM + D PO), Take 1 tablet by mouth daily. , Disp: , Rfl:  .  chlorpheniramine (ALLERGY RELIEF) 4 MG tablet, Take 4 mg by mouth 2 (two) times daily as needed for allergies., Disp: , Rfl:  .  diazepam (VALIUM) 5 MG tablet, Take 5 mg by mouth daily as needed., Disp: , Rfl: 3 .  diclofenac sodium (VOLTAREN) 1 % GEL, APPLY 2 4 GRAMS 3 TO 4 TIMES DAILY, Disp: , Rfl:  .  fish oil-omega-3 fatty acids 1000 MG capsule, Take 2 g by mouth daily.  , Disp: , Rfl:  .  gabapentin (NEURONTIN) 100 MG capsule, Take 100-300 mg by mouth at bedtime as needed (pain or insomnia)., Disp: , Rfl:  .  glucosamine-chondroitin 500-400 MG tablet, Take 1 tablet by mouth daily.  , Disp: , Rfl:  .  meloxicam (MOBIC) 15 MG tablet, Take 7.5 mg by mouth daily., Disp: , Rfl: 2 .  methocarbamol (ROBAXIN) 750 MG tablet, Take 750 mg by mouth every 8 (eight) hours as needed., Disp: , Rfl:  .  Specialty Vitamins Products (MAGNESIUM, AMINO ACID CHELATE,) 133 MG tablet, Take 1 tablet by mouth daily.  , Disp: , Rfl:  .  traMADol (ULTRAM) 50 MG tablet, TAKE 1 TO 2 TABLETS BY MOUTH 2 TO 3 TIMES DAILY AS NEEDED, Disp: , Rfl:  .  VITAMIN D, CHOLECALCIFEROL, PO, Take 1 tablet  by mouth daily. , Disp: , Rfl:  .  Vitamin D, Ergocalciferol, (DRISDOL) 1.25 MG (50000 UT) CAPS capsule, Take one tablet by mouth once weekly for 12 weeks, then call office to schedule lab appointment to recheck vitamin d,  Disp: 12 capsule, Rfl: 0    Physical Exam: Blood pressure 138/80, pulse 74, height 5\' 2"  (1.575 m), weight 140 lb (63.5 kg), SpO2 96 %.   Affect appropriate Healthy:  appears stated age 23: normal Neck supple with no adenopathy JVP normal no bruits no thyromegaly Lungs clear with no wheezing and good diaphragmatic motion Heart:  S1/S2 no murmur, no rub, gallop or click PMI normal Abdomen: benighn, BS positve, no tenderness, no AAA no bruit.  No HSM or HJR Distal pulses intact with no bruits No edema Neuro non-focal Skin warm and dry No muscular weakness   Labs:   Lab Results  Component Value Date   WBC 5.4 04/09/2019   HGB 13.4 04/09/2019   HCT 40.8 04/09/2019   MCV 89.9 04/09/2019   PLT 173.0 04/09/2019   No results for input(s): NA, K, CL, CO2, BUN, CREATININE, CALCIUM, PROT, BILITOT, ALKPHOS, ALT, AST, GLUCOSE in the last 168 hours.  Invalid input(s): LABALBU No results found for: CKTOTAL, CKMB, CKMBINDEX, TROPONINI No results found for: CHOL No results found for: HDL No results found for: LDLCALC No results found for: TRIG No results found for: CHOLHDL No results found for: LDLDIRECT    Radiology: DG Chest 2 View  Result Date: 04/09/2019 CLINICAL DATA:  Dyspnea EXAM: CHEST - 2 VIEW COMPARISON:  10/29/2017 FINDINGS: Normal heart size and mediastinal contours. There is no edema, consolidation, effusion, or pneumothorax. Spinal degeneration and mild scoliosis. IMPRESSION: No active cardiopulmonary disease. Electronically Signed   By: Monte Fantasia M.D.   On: 04/09/2019 10:52    EKG:  SR rate 72 normal    ASSESSMENT AND PLAN:   1. Chest Pain:  Atypical despite risk factors mostly non exertional and sharp. Discussed options and favor  cardiac CTA will give lopressor 50 mg night before and morning of.  2. Dyspnea:  Likely more related to lung disease PFTls done in 2019 showed moderate COPD and response to dilators Inhalers recently changed consider echo after cardiac CTA  3. HLD: Intolerant to statins Discussed options including Nexlitol and PSK-9 will refer to lipid clinic Update labs when getting BMET for contrast and cardiac CT  Signed: Jenkins Rouge 04/20/2019, 4:56 PM

## 2019-04-20 ENCOUNTER — Ambulatory Visit: Payer: Medicare Other | Admitting: Cardiovascular Disease

## 2019-04-20 ENCOUNTER — Telehealth: Payer: Self-pay | Admitting: Cardiovascular Disease

## 2019-04-20 ENCOUNTER — Other Ambulatory Visit: Payer: Self-pay

## 2019-04-20 ENCOUNTER — Encounter: Payer: Self-pay | Admitting: Cardiovascular Disease

## 2019-04-20 VITALS — BP 138/80 | HR 74 | Ht 62.0 in | Wt 140.0 lb

## 2019-04-20 DIAGNOSIS — R079 Chest pain, unspecified: Secondary | ICD-10-CM | POA: Diagnosis not present

## 2019-04-20 DIAGNOSIS — E785 Hyperlipidemia, unspecified: Secondary | ICD-10-CM | POA: Diagnosis not present

## 2019-04-20 DIAGNOSIS — R0600 Dyspnea, unspecified: Secondary | ICD-10-CM | POA: Diagnosis not present

## 2019-04-20 MED ORDER — METOPROLOL TARTRATE 50 MG PO TABS: ORAL_TABLET | ORAL | 0 refills | Status: DC

## 2019-04-20 NOTE — Patient Instructions (Addendum)
Medication Instructions:  *If you need a refill on your cardiac medications before your next appointment, please call your pharmacy*  Lab Work: Your physician recommends that you return for lab work a few days before your CT for a fasting CMET and Lipid panel  If you have labs (blood work) drawn today and your tests are completely normal, you will receive your results only by: Marland Kitchen MyChart Message (if you have MyChart) OR . A paper copy in the mail If you have any lab test that is abnormal or we need to change your treatment, we will call you to review the results.  Testing/Procedures: Your physician has requested that you have cardiac CT. Cardiac computed tomography (CT) is a painless test that uses an x-ray machine to take clear, detailed pictures of your heart. For further information please visit [email or web address].[email or web address]. Please follow instruction sheet as given.  Follow-Up: At [facility], you and your health needs are our priority.  As part of our continuing mission to provide you with exceptional heart care, we have created designated Provider Care Teams.  These Care Teams include your primary Cardiologist (physician) and Advanced Practice Providers (APPs -  Physician Assistants and Nurse Practitioners) who all work together to provide you with the care you need, when you need it.  We recommend signing up for the patient portal called "MyChart".  Sign up information is provided on this After Visit Summary.  MyChart is used to connect with patients for Virtual Visits (Telemedicine).  Patients are able to view lab/test results, encounter notes, upcoming appointments, etc.  Non-urgent messages can be sent to your provider as well.   To learn more about what you can do with MyChart, go to [email or web address].    Your next appointment:   1 year(s)  The format for your next appointment:   In Person  Provider:   You may see Dr. [name] or one of the following Advanced Practice Providers on  your designated Care Team:    Truitt Merle, NP  Cecilie Kicks, NP  Kathyrn Drown, NP  You have been referred to [facility].  Your cardiac CT will be scheduled at one of the below locations:   [facility] [address] [place], [place] [address] [phone]  If scheduled at [facility], please arrive at the [facility] main entrance of [facility] 30 minutes prior to test start time. Proceed to the [facility] Radiology Department (first floor) to check-in and test prep.   Please follow these instructions carefully (unless otherwise directed):   On the Night Before the Test: . Be sure to Drink plenty of water. . Do not consume any caffeinated/decaffeinated beverages or chocolate 12 hours prior to your test. . Do not take any antihistamines 12 hours prior to your test. . Take metoprolol (Lopressor) night before test  On the Day of the Test: . Drink plenty of water. Do not drink any water within one hour of the test. . Do not eat any food 4 hours prior to the test. . You may take your regular medications prior to the test.  . Take metoprolol (Lopressor) two hours prior to test. . FEMALES- please wear underwire-free bra if available  After the Test: . Drink plenty of water. . After receiving IV contrast, you may experience a mild flushed feeling. This is normal. . On occasion, you may experience a mild rash up to 24 hours after the test. This is not dangerous. If this occurs, you can  take Benadryl 25 mg and increase your fluid intake. . If you experience trouble breathing, this can be serious. If it is severe call 911 IMMEDIATELY. If it is mild, please call our office.  Once we have confirmed authorization from your insurance company, we will call you to set up a date and time for your test.   For non-scheduling related questions, please contact the cardiac imaging nurse navigator should you have any questions/concerns: Marchia Bond, RN  Navigator Cardiac Imaging [facility] [phone] office  For scheduling needs, including cancellations and rescheduling, please call [phone].

## 2019-04-20 NOTE — Telephone Encounter (Signed)
Patient is calling requesting to have her spouse accompany her during her appointment scheduled for today, 04/20/19 at 4:30 PM with Dr. [name]. I have informed the patient that per our offices' policy we are not allowing any guests to accompany the patients due to COVID-19 protocol. Patient verbalized understanding.

## 2019-04-26 ENCOUNTER — Other Ambulatory Visit: Payer: Medicare Other | Admitting: *Deleted

## 2019-04-26 ENCOUNTER — Ambulatory Visit (INDEPENDENT_AMBULATORY_CARE_PROVIDER_SITE_OTHER): Payer: Medicare Other | Admitting: Pharmacist

## 2019-04-26 ENCOUNTER — Other Ambulatory Visit: Payer: Self-pay

## 2019-04-26 DIAGNOSIS — E785 Hyperlipidemia, unspecified: Secondary | ICD-10-CM

## 2019-04-26 DIAGNOSIS — E782 Mixed hyperlipidemia: Secondary | ICD-10-CM

## 2019-04-26 DIAGNOSIS — R0600 Dyspnea, unspecified: Secondary | ICD-10-CM

## 2019-04-26 DIAGNOSIS — R079 Chest pain, unspecified: Secondary | ICD-10-CM

## 2019-04-26 DIAGNOSIS — G720 Drug-induced myopathy: Secondary | ICD-10-CM | POA: Insufficient documentation

## 2019-04-26 DIAGNOSIS — T466X5A Adverse effect of antihyperlipidemic and antiarteriosclerotic drugs, initial encounter: Secondary | ICD-10-CM | POA: Insufficient documentation

## 2019-04-26 LAB — LIPID PANEL
Chol/HDL Ratio: 4.1 ratio (ref 0.0–4.4)
Cholesterol, Total: 304 mg/dL — ABNORMAL HIGH (ref 100–199)
HDL: 74 mg/dL (ref >39)
LDL Chol Calc (NIH): 221 mg/dL — ABNORMAL HIGH (ref 0–99)
Triglycerides: 63 mg/dL (ref 0–149)
VLDL Cholesterol Cal: 9 mg/dL (ref 5–40)

## 2019-04-26 LAB — COMPREHENSIVE METABOLIC PANEL
ALT: 25 IU/L (ref 0–32)
AST: 30 IU/L (ref 0–40)
Albumin/Globulin Ratio: 2 (ref 1.2–2.2)
Albumin: 4.3 g/dL (ref 3.7–4.7)
Alkaline Phosphatase: 72 IU/L (ref 39–117)
BUN/Creatinine Ratio: 20 (ref 12–28)
BUN: 20 mg/dL (ref 8–27)
Bilirubin Total: 0.5 mg/dL (ref 0.0–1.2)
CO2: 23 mmol/L (ref 20–29)
Calcium: 9.8 mg/dL (ref 8.7–10.3)
Chloride: 105 mmol/L (ref 96–106)
Creatinine, Ser: 1 mg/dL (ref 0.57–1.00)
GFR calc Af Amer: 64 mL/min/{1.73_m2} (ref >59)
GFR calc non Af Amer: 56 mL/min/{1.73_m2} — ABNORMAL LOW (ref >59)
Globulin, Total: 2.2 g/dL (ref 1.5–4.5)
Glucose: 82 mg/dL (ref 65–99)
Potassium: 4.4 mmol/L (ref 3.5–5.2)
Sodium: 144 mmol/L (ref 134–144)
Total Protein: 6.5 g/dL (ref 6.0–8.5)

## 2019-04-26 MED ORDER — PRALUENT 150 MG/ML ~~LOC~~ SOAJ: 1.0000 "pen " | SUBCUTANEOUS | 11 refills | Status: DC

## 2019-04-26 NOTE — Patient Instructions (Addendum)
It was nice to meet you today!   Your LDL is 203 and your goal is < 70   I will submit information to your insurance to see if they will cover Praluent injections. This medication is stored in the fridge, is given every 2 weeks into the fatty tissue of your stomach, and lowers your LDL cholesterol by 60%  Ezetimibe (Zetia) is another medication that has shown to lower LDL cholesterol by 20%. This medication is tolerated very well and is one tablet every day.    I will give you a call when your fasting labs come back from this morning and when the Praluent injections are ready for you to pick up at your pharmacy  We will plan to recheck your cholesterol 6-8 weeks after starting the injections

## 2019-04-26 NOTE — Progress Notes (Signed)
Patient ID: Diana Cisneros                 DOB: 03/06/1945                    MRN: [id number]     HPI: Diana Cisneros is a 74 y.o. female patient referred to lipid clinic by Dr. [name]. PMH is significant for ASCVD with chronic aortic atherosclerosis, HLD, family history of CAD, and bronchospastic lung disease. CXR in October 2019 showed chronic aortic atherosclerosis. She was last seen by Dr. [name] on 04/20/19 where she endorsed fatigue, dyspnea, and progressive CP, and an intolerance to statins. She was referred to lipid clinic to discuss other lipid-lowering agents.  Patient arrives today for initial visit. She states that she has only tried one dose of one statin in the past and had to stop it immediately because of severe myalgias. She is not willing to try another statin. She has been working on her diet with her nutritionist and has decreased her intake of fried foods and eats more fish. 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 She does have a cardiac CTA scheduled for 05/14/19.  Current Medications:  OTC fish oil 2g daily Intolerances: tried only one dose of one statin, pt cannot remember details (myalgias) Risk Factors: ASCVD (chronic aortic stenosis), HLD, family history, former smoker LDL goal: <70 mg/dL  Diet: Has decreased fried food intake recently, does not eat a lot of meat and prefers fish. Eats eggs or yogurt and tea for breakfast, salad for lunch, fish for dinner. Goes out to eat two times weekly. Drinks wine, club soda; starting to drink more water.  Exercise: Stays active through [employer]. Walks 7-8 bigger dogs three times daily for 1-2 hours each time.  Family History: Fatal heart attack in mother (age 49) and heart disease in maternal grandmother, fatal stroke in father (age 41), diabetes in maternal grandmother and grandfather, cancer in maternal grandfather (colon) and paternal grandmother (breast, age 61)  Social History: Former  smoker (quit 1965), alcohol use (2 drinks wine/week)  Labs:  04/26/19: TC 304, TG 63, HDL 74, LDL 221; LFTs and Scr wnl (no lipid-lowering agents) 09/15/18: TC 293, TG 81, HDL 73, LDL 203, non-HDL 220; LFTs and Scr wnl (no lipid-lowering agents)  Past Medical History:  Diagnosis Date  . Allergic rhinitis   . Anxiety   . Anxiousness   . Degenerative disc disease    thoracic,lumbar  . Hypercholesterolemia   . Hyperlipidemia   . Low back pain   . Moderate persistent asthma without complication [id number]  . Muscle cramps   . Nail dystrophy 12/30/2018  . Osteopenia 10/2018   T score -2.2 distal third of forearm.  -1.6 left femoral neck FRAX 18% / 6.7%  . Seasonal allergies   . Sinusitis, acute   . URI (upper respiratory infection)     Current Outpatient Medications on File Prior to Visit  Medication Sig Dispense Refill  . Ascorbic Acid (VITAMIN C) 100 MG tablet Take 100 mg by mouth daily.      Marland Kitchen augmented betamethasone dipropionate (DIPROLENE-AF) 0.05 % cream APPLY TO AFFECTED AREA AS NEEDED 50 g 0  . BREO ELLIPTA 200-25 MCG/INH AEPB INHALE 1 PUFF INTO THE LUNGS DAILY FOR 1 DAY 60 each 0  . budesonide-formoterol (SYMBICORT) 160-4.5 MCG/ACT inhaler Inhale 2 puffs into the lungs 2 (two) times daily. 1 Inhaler 3  . Calcium Carbonate-Vitamin D (CALCIUM + D PO)  Take 1 tablet by mouth daily.     . chlorpheniramine (ALLERGY RELIEF) 4 MG tablet Take 4 mg by mouth 2 (two) times daily as needed for allergies.    . diazepam (VALIUM) 5 MG tablet Take 5 mg by mouth daily as needed.  3  . diclofenac sodium (VOLTAREN) 1 % GEL APPLY 2 4 GRAMS 3 TO 4 TIMES DAILY    . fish oil-omega-3 fatty acids 1000 MG capsule Take 2 g by mouth daily.      Marland Kitchen gabapentin (NEURONTIN) 100 MG capsule Take 100-300 mg by mouth at bedtime as needed (pain or insomnia).    Marland Kitchen glucosamine-chondroitin 500-400 MG tablet Take 1 tablet by mouth daily.      . meloxicam (MOBIC) 15 MG tablet Take 7.5 mg by mouth daily.  2  .  methocarbamol (ROBAXIN) 750 MG tablet Take 750 mg by mouth every 8 (eight) hours as needed.    . metoprolol tartrate (LOPRESSOR) 50 MG tablet Take one tablet by mouth the night before CT and take one tablet by mouth 2 hours prior to CT 2 tablet 0  . Specialty Vitamins Products (MAGNESIUM, AMINO ACID CHELATE,) 133 MG tablet Take 1 tablet by mouth daily.      . traMADol (ULTRAM) 50 MG tablet TAKE 1 TO 2 TABLETS BY MOUTH 2 TO 3 TIMES DAILY AS NEEDED    . VITAMIN D, CHOLECALCIFEROL, PO Take 1 tablet by mouth daily.     . Vitamin D, Ergocalciferol, (DRISDOL) 1.25 MG (50000 UT) CAPS capsule Take one tablet by mouth once weekly for 12 weeks, then call office to schedule lab appointment to recheck vitamin d 12 capsule 0   No current facility-administered medications on file prior to visit.    Allergies  Allergen Reactions  . Morphine And Related Hives  . Naproxen Sodium Rash    Assessment/Plan:  1. Hyperlipidemia - LDL is elevated and above goal of < 70 mg/dL. Per pt request, will start Praluent 150mg  Q2 week injections. Prior authorization approved through 10/27/19 and new Rx sent to pharmacy. Copay is affordable at $47/month. She will likely need an additional lipid-lowering agent as Praluent alone will not likely get her to goal, but pt would like to try the injections first before adding another agent. Would consider adding ezetimibe 10mg  daily and/or rosuvastatin 5mg  daily if follow-up labs are still above goal. Discussed the importance of maintaining a regular exercise regimen and sticking to a heart-healthy diet low in saturated fats. She will continue to decrease her fried food intake. Scheduled follow-up fasting labs for 06/21/19.   Richardine Service, PharmD PGY1 Pharmacy Resident

## 2019-04-28 ENCOUNTER — Other Ambulatory Visit: Payer: Self-pay

## 2019-04-28 ENCOUNTER — Ambulatory Visit: Payer: Medicare Other | Admitting: Cardiology

## 2019-04-28 ENCOUNTER — Ambulatory Visit: Payer: Medicare Other | Admitting: Podiatry

## 2019-04-28 ENCOUNTER — Encounter: Payer: Self-pay | Admitting: Podiatry

## 2019-04-28 VITALS — Temp 98.0°F

## 2019-04-28 DIAGNOSIS — L603 Nail dystrophy: Secondary | ICD-10-CM | POA: Diagnosis not present

## 2019-04-28 DIAGNOSIS — L84 Corns and callosities: Secondary | ICD-10-CM

## 2019-04-28 NOTE — Progress Notes (Signed)
This patient returns to the office with chief complaint of thick pain thick painful second toenails both feet.  She says the nails are painful walking wearing her shoes.  She is unable to self treat since she cannot reach her feet.  She also says she has significantly painful calluses on the fifth toes both feet.  She says these are painful and throbbing intermittently.  She has provided no self treatment for the calluses.  She presents the office today for preventative foot care services.  General Appearance  Alert, conversant and in no acute stress.  Vascular  Dorsalis pedis and posterior tibial  pulses are palpable  bilaterally.  Capillary return is within normal limits  bilaterally. Temperature is within normal limits  bilaterally.  Neurologic  Senn-Weinstein monofilament wire test within normal limits  bilaterally. Muscle power within normal limits bilaterally.  Nails Thick disfigured discolored nails with subungual debris  Second toenails   bilaterally. No evidence of bacterial infection or drainage bilaterally.  Orthopedic  No limitations of motion  feet .  No crepitus or effusions noted.  No bony pathology or digital deformities noted.  Skin  normotropic skin with no porokeratosis noted bilaterally.  No signs of infections or ulcers noted.  Heloma durum fifth digit  B/L  Onychomycosis  X 2.  Heloma durum  Fifth toes  B/l  Debridement of her thick painful nails both feet using a nail nipper and trimming with a dermal tool.  No incidents noted.  Debridement of the heloma durum fifth digit bilaterally with a #15 blade.  Discussed future surgery for the correction of the fifth toes in the future.  Return to clinic 3 months  Gardiner Barefoot DPM

## 2019-05-13 ENCOUNTER — Encounter (HOSPITAL_COMMUNITY): Payer: Self-pay

## 2019-05-13 ENCOUNTER — Telehealth (HOSPITAL_COMMUNITY): Payer: Self-pay | Admitting: Emergency Medicine

## 2019-05-13 NOTE — Telephone Encounter (Signed)
Reaching out to patient to offer assistance regarding upcoming cardiac imaging study; pt verbalizes understanding of appt date/time, parking situation and where to check in, pre-test NPO status and medications ordered, and verified current allergies; name and call back number provided for further questions should they arise Marchia Bond RN Navigator Cardiac Imaging [facility] [phone] office [phone] cell  Pt reporting extreme claustro instructed her to take PRN valium 30-50mins prior to test, states husband driving

## 2019-05-14 ENCOUNTER — Ambulatory Visit (HOSPITAL_COMMUNITY): Payer: Medicare Other

## 2019-05-28 ENCOUNTER — Telehealth (HOSPITAL_COMMUNITY): Payer: Self-pay | Admitting: Emergency Medicine

## 2019-05-28 NOTE — Telephone Encounter (Signed)
Reaching out to patient to offer assistance regarding upcoming cardiac imaging study; pt verbalizes understanding of appt date/time, parking situation and where to check in, pre-test NPO status and medications ordered, and verified current allergies; name and call back number provided for further questions should they arise Marchia Bond RN [place] [facility] [phone] office [phone] cell   Pt states she intends to take valium prior to scan with husband driving.   Clarise Cruz

## 2019-05-31 ENCOUNTER — Other Ambulatory Visit: Payer: Self-pay

## 2019-05-31 ENCOUNTER — Ambulatory Visit (HOSPITAL_COMMUNITY)
Admission: RE | Admit: 2019-05-31 | Discharge: 2019-05-31 | Disposition: A | Discharge status: Home / Self Care | Payer: Medicare Other | Source: Ambulatory Visit | Attending: Cardiovascular Disease | Admitting: Cardiovascular Disease

## 2019-05-31 DIAGNOSIS — J479 Bronchiectasis, uncomplicated: Secondary | ICD-10-CM | POA: Diagnosis not present

## 2019-05-31 DIAGNOSIS — I700 Atherosclerosis of aorta: Secondary | ICD-10-CM | POA: Insufficient documentation

## 2019-05-31 DIAGNOSIS — R079 Chest pain, unspecified: Secondary | ICD-10-CM

## 2019-05-31 DIAGNOSIS — R0600 Dyspnea, unspecified: Secondary | ICD-10-CM | POA: Diagnosis present

## 2019-05-31 DIAGNOSIS — R918 Other nonspecific abnormal finding of lung field: Secondary | ICD-10-CM | POA: Insufficient documentation

## 2019-05-31 DIAGNOSIS — E785 Hyperlipidemia, unspecified: Secondary | ICD-10-CM | POA: Insufficient documentation

## 2019-05-31 MED ORDER — NITROGLYCERIN 0.4 MG SL SUBL
SUBLINGUAL_TABLET | SUBLINGUAL | Status: AC
  Filled 2019-05-31: qty 2

## 2019-05-31 MED ORDER — IOHEXOL 350 MG/ML SOLN
80.0000 mL | Freq: Once | INTRAVENOUS | Status: AC | PRN
  Administered 2019-05-31: 80 mL via INTRAVENOUS

## 2019-05-31 MED ORDER — NITROGLYCERIN 0.4 MG SL SUBL
0.8000 mg | SUBLINGUAL_TABLET | Freq: Once | SUBLINGUAL | Status: AC
  Administered 2019-05-31: 09:00:00 0.8 mg via SUBLINGUAL

## 2019-06-14 ENCOUNTER — Other Ambulatory Visit: Payer: Medicare Other

## 2019-06-14 ENCOUNTER — Other Ambulatory Visit: Payer: Self-pay

## 2019-06-14 DIAGNOSIS — E782 Mixed hyperlipidemia: Secondary | ICD-10-CM

## 2019-06-14 LAB — LIPID PANEL
Chol/HDL Ratio: 3.8 ratio (ref 0.0–4.4)
Cholesterol, Total: 296 mg/dL — ABNORMAL HIGH (ref 100–199)
HDL: 77 mg/dL (ref >39)
LDL Chol Calc (NIH): 211 mg/dL — ABNORMAL HIGH (ref 0–99)
Triglycerides: 59 mg/dL (ref 0–149)
VLDL Cholesterol Cal: 8 mg/dL (ref 5–40)

## 2019-06-18 ENCOUNTER — Ambulatory Visit: Payer: Medicare Other | Admitting: Cardiovascular Disease

## 2019-07-08 ENCOUNTER — Other Ambulatory Visit: Payer: Self-pay | Admitting: Obstetrics and Gynecology

## 2019-07-08 DIAGNOSIS — Z1231 Encounter for screening mammogram for malignant neoplasm of breast: Secondary | ICD-10-CM

## 2019-07-27 ENCOUNTER — Other Ambulatory Visit: Payer: Self-pay | Admitting: Pulmonary Disease

## 2019-07-27 ENCOUNTER — Ambulatory Visit: Payer: Medicare Other | Admitting: Pulmonary Disease

## 2019-07-30 ENCOUNTER — Other Ambulatory Visit: Payer: Self-pay

## 2019-07-30 ENCOUNTER — Ambulatory Visit
Admission: RE | Admit: 2019-07-30 | Discharge: 2019-07-30 | Disposition: A | Discharge status: Home / Self Care | Payer: Medicare Other | Source: Ambulatory Visit | Attending: Obstetrics and Gynecology | Admitting: Obstetrics and Gynecology

## 2019-07-30 ENCOUNTER — Ambulatory Visit: Payer: Medicare Other | Admitting: Pulmonary Disease

## 2019-07-30 DIAGNOSIS — Z1231 Encounter for screening mammogram for malignant neoplasm of breast: Secondary | ICD-10-CM

## 2019-08-17 ENCOUNTER — Encounter: Payer: Self-pay | Admitting: Pulmonary Disease

## 2019-08-17 ENCOUNTER — Other Ambulatory Visit: Payer: Self-pay

## 2019-08-17 ENCOUNTER — Ambulatory Visit: Payer: Medicare Other | Admitting: Pulmonary Disease

## 2019-08-17 VITALS — BP 128/72 | HR 72 | Temp 98.0°F | Ht 62.0 in | Wt 134.8 lb

## 2019-08-17 DIAGNOSIS — R0602 Shortness of breath: Secondary | ICD-10-CM

## 2019-08-17 DIAGNOSIS — J4540 Moderate persistent asthma, uncomplicated: Secondary | ICD-10-CM | POA: Diagnosis not present

## 2019-08-17 DIAGNOSIS — R05 Cough: Secondary | ICD-10-CM | POA: Diagnosis not present

## 2019-08-17 DIAGNOSIS — R059 Cough, unspecified: Secondary | ICD-10-CM

## 2019-08-17 MED ORDER — BREO ELLIPTA 200-25 MCG/INH IN AEPB: 1.0000 | INHALATION_SPRAY | Freq: Every day | RESPIRATORY_TRACT | 5 refills | Status: DC

## 2019-08-17 NOTE — Progress Notes (Signed)
Diana Cisneros    [id number]    1945-03-26  Primary Care Physician:Polite, Jori Moll, MD  Referring Physician: Seward Wileen, MD [address] [place],  [place] [address]  Chief complaint: Follow-up for cough, asthma  HPI: 74 year old with history of allergic rhinitis, hyperlipidemia.  Complains of chronic cough for the past 1 to 2 months associated with clear to white mucus.  She has minimal dyspnea on exertion.  No symptoms at rest.  No wheezing, fevers, chills Cough is exacerbated on tilting her neck back and lying down.  She has history of allergic rhinitis, postnasal drip and takes loratadine at night.  No symptoms of GERD.  Pets: Has a dog and a cat. 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  No recent exposure.  No dampness, Jacuzzi, hot tub. Smoking history: Minimal smoking as a teenager. Travel history: Originally from [place].  Lived in [place].  No significant recent travel. Relevant family history: No significant family history of lung disease.  Interim history: At last visit Breo was changed to Symbicort since she felt that Memory Dance is giving her cramps Her cramps continue in spite of the change in inhalers and she wants to go back on the [place] as she liked it better.   Recently had a cardiac CT which showed mild bronchiectasis with tree-in-bud appearance.  Outpatient Encounter Medications as of 08/17/2019  Medication Sig  . Ascorbic Acid (VITAMIN C) 100 MG tablet Take 100 mg by mouth daily.    Marland Kitchen augmented betamethasone dipropionate (DIPROLENE-AF) 0.05 % cream APPLY TO AFFECTED AREA AS NEEDED  . Calcium Carbonate-Vitamin D (CALCIUM + D PO) Take 1 tablet by mouth daily.   . chlorpheniramine (ALLERGY RELIEF) 4 MG tablet Take 4 mg by mouth 2 (two) times daily as needed for allergies.  . diazepam (VALIUM) 5 MG tablet Take 5 mg by mouth  daily as needed.  . diclofenac sodium (VOLTAREN) 1 % GEL APPLY 2 4 GRAMS 3 TO 4 TIMES DAILY  . fish oil-omega-3 fatty acids 1000 MG capsule Take 2 g by mouth daily.    Marland Kitchen gabapentin (NEURONTIN) 100 MG capsule Take 100-300 mg by mouth at bedtime as needed (pain or insomnia).  Marland Kitchen glucosamine-chondroitin 500-400 MG tablet Take 1 tablet by mouth daily.    . meloxicam (MOBIC) 15 MG tablet Take 7.5 mg by mouth daily.  . methocarbamol (ROBAXIN) 750 MG tablet Take 750 mg by mouth every 8 (eight) hours as needed.  . metoprolol tartrate (LOPRESSOR) 50 MG tablet Take one tablet by mouth the night before CT and take one tablet by mouth 2 hours prior to CT  . Specialty Vitamins Products (MAGNESIUM, AMINO ACID CHELATE,) 133 MG tablet Take 1 tablet by mouth daily.    . SYMBICORT 160-4.5 MCG/ACT inhaler INHALE 2 PUFFS INTO THE LUNGS TWICE A DAY  . traMADol (ULTRAM) 50 MG tablet TAKE 1 TO 2 TABLETS BY MOUTH 2 TO 3 TIMES DAILY AS NEEDED  . VITAMIN D, CHOLECALCIFEROL, PO Take 1 tablet by mouth daily.   . Vitamin D, Ergocalciferol, (DRISDOL) 1.25 MG (50000 UT) CAPS capsule Take one tablet by mouth once weekly for 12 weeks, then call office to schedule lab appointment to recheck vitamin d  . BREO ELLIPTA 200-25 MCG/INH AEPB INHALE 1 PUFF INTO THE LUNGS DAILY FOR 1 DAY (Patient not taking: Reported on 08/17/2019)  . [  DISCONTINUED] Alirocumab (PRALUENT) 150 MG/ML SOAJ Inject 1 pen into the skin every 14 (fourteen) days. (Patient not taking: Reported on 05/31/2019)   No facility-administered encounter medications on file as of 08/17/2019.   Physical Exam: Blood pressure 128/72, pulse 72, temperature 98 F (36.7 C), temperature source Oral, height 5\' 2"  (1.575 m), weight 134 lb 12.8 oz (61.1 kg), SpO2 97 %. Gen:      No acute distress HEENT:  EOMI, sclera anicteric Neck:     No masses; no thyromegaly Lungs:    Clear to auscultation bilaterally; normal respiratory effort CV:         Regular rate and rhythm; no  murmurs Abd:      + bowel sounds; soft, non-tender; no palpable masses, no distension Ext:    No edema; adequate peripheral perfusion Skin:      Warm and dry; no rash Neuro: alert and oriented x 3 Psych: normal mood and affect  Data Reviewed: Imaging: CT chest 04/09/2007-visualized lung bases are normal. Chest x-ray 06/01/2015-clear lungs, no acute cardiopulmonary abnormality. Chest x-ray 10/29/2017- no active lung disease, chronic scarring the right middle lobe and lingula. Cardiac CT 05/31/2019-visualized lungs show mild bronchiectasis with tree-in-bud appearance. I have reviewed the images personally.  PFTs: 11/24/2017 FVC 2.35 [89%), FEV1 1.53 [77%), F/F 65 post FEV1 1.90 [96%, +23%), TLC 115%, DLCO 18% Moderate obstruction with significant bronchodilator response and air trapping.  FENO 10/29/2017-18 FENO 11/24/2017-23  ACT score 04/09/2019-22  Labs: CBC 10/29/2017-WBC 5.2, eos 2.8%, absolute eosinophil count 146 CBC 04/01/2019-WBC 5.4, eos 2.1%, absolute eosinophil count 113  Respiratory allergy profile allergy profile 10/29/2017-IgE 31, RAST panel-negative Respiratory allergy profile 04/09/2019-IgE 28, RAST panel negative  Assessment:  Follow-up for chronic cough Suspect upper airway cough from postnasal drip, possibly silent acid reflux Symptoms improved with chlorpheniramine, Prilosec  Asthma PFTs reviewed with obstruction and significant bronchodilator response No evidence elevated IgE or FENO, mild elevation in peripheral eosinophilia  Switch back to Breo from Symbicort as she prefers the once a day inhaler  Abnormal CT chest Cardiac CT reviewed with mild bronchiectasis and tree-in-bud. We will try to get sputum for AFB to rule out MAI Schedule high-resolution CT for better evaluation of the lungs  If unable to get adequate samples then we may need to consider bronchoscopy  Plan/Recommendations: - Chlorphentermine PRN, Flonase - Prilosec - Start Symbicort and stop  Breo - HRCT, sputum for culture  Marshell Garfinkel MD [facility] 08/17/2019, 2:11 PM  CC: Seward Dawanda, MD

## 2019-08-17 NOTE — Patient Instructions (Signed)
We will switch you back to Breo 200 from Symbicort  Get sputum for AFB, fungus and regular cultures Schedule high-resolution CT  Follow-up in 3 months.

## 2019-08-26 ENCOUNTER — Ambulatory Visit (INDEPENDENT_AMBULATORY_CARE_PROVIDER_SITE_OTHER)
Admission: RE | Admit: 2019-08-26 | Discharge: 2019-08-26 | Disposition: A | Discharge status: Home / Self Care | Payer: Medicare Other | Source: Ambulatory Visit | Attending: Pulmonary Disease | Admitting: Pulmonary Disease

## 2019-08-26 ENCOUNTER — Other Ambulatory Visit: Payer: Self-pay

## 2019-08-26 DIAGNOSIS — R05 Cough: Secondary | ICD-10-CM

## 2019-08-26 DIAGNOSIS — R059 Cough, unspecified: Secondary | ICD-10-CM

## 2019-08-26 DIAGNOSIS — R0602 Shortness of breath: Secondary | ICD-10-CM | POA: Diagnosis not present

## 2019-08-26 DIAGNOSIS — J4540 Moderate persistent asthma, uncomplicated: Secondary | ICD-10-CM

## 2019-08-27 ENCOUNTER — Encounter: Payer: Self-pay | Admitting: Obstetrics and Gynecology

## 2019-08-27 ENCOUNTER — Encounter: Payer: Medicare Other | Admitting: Gynecology

## 2019-08-27 ENCOUNTER — Ambulatory Visit: Payer: Medicare Other | Admitting: Obstetrics and Gynecology

## 2019-08-27 VITALS — BP 136/84 | Ht 61.0 in | Wt 134.0 lb

## 2019-08-27 DIAGNOSIS — Z01419 Encounter for gynecological examination (general) (routine) without abnormal findings: Secondary | ICD-10-CM

## 2019-08-27 DIAGNOSIS — E559 Vitamin D deficiency, unspecified: Secondary | ICD-10-CM | POA: Diagnosis not present

## 2019-08-27 DIAGNOSIS — M8580 Other specified disorders of bone density and structure, unspecified site: Secondary | ICD-10-CM | POA: Diagnosis not present

## 2019-08-27 NOTE — Progress Notes (Signed)
Diana Cisneros [facility] Feb 24, 1945 [id number]  SUBJECTIVE:  74 y.o. G0P0 female here for an annual routine gynecologic exam. She has no gynecologic concerns.  Current Outpatient Medications  Medication Sig Dispense Refill  . Ascorbic Acid (VITAMIN C) 100 MG tablet Take 100 mg by mouth daily.      Marland Kitchen augmented betamethasone dipropionate (DIPROLENE-AF) 0.05 % cream APPLY TO AFFECTED AREA AS NEEDED 50 g 0  . BREO ELLIPTA 200-25 MCG/INH AEPB INHALE 1 PUFF INTO THE LUNGS DAILY FOR 1 DAY 60 each 0  . Calcium Carbonate-Vitamin D (CALCIUM + D PO) Take 1 tablet by mouth daily.     . chlorpheniramine (ALLERGY RELIEF) 4 MG tablet Take 4 mg by mouth 2 (two) times daily as needed for allergies.    . diazepam (VALIUM) 5 MG tablet Take 5 mg by mouth daily as needed.  3  . diclofenac sodium (VOLTAREN) 1 % GEL APPLY 2 4 GRAMS 3 TO 4 TIMES DAILY    . fish oil-omega-3 fatty acids 1000 MG capsule Take 2 g by mouth daily.      . fluticasone furoate-vilanterol (BREO ELLIPTA) 200-25 MCG/INH AEPB Inhale 1 puff into the lungs daily. 60 each 5  . gabapentin (NEURONTIN) 100 MG capsule Take 100-300 mg by mouth at bedtime as needed (pain or insomnia).    Marland Kitchen glucosamine-chondroitin 500-400 MG tablet Take 1 tablet by mouth daily.      . meloxicam (MOBIC) 15 MG tablet Take 7.5 mg by mouth daily.  2  . methocarbamol (ROBAXIN) 750 MG tablet Take 750 mg by mouth every 8 (eight) hours as needed.    Marland Kitchen Specialty Vitamins Products (MAGNESIUM, AMINO ACID CHELATE,) 133 MG tablet Take 1 tablet by mouth daily.      . traMADol (ULTRAM) 50 MG tablet TAKE 1 TO 2 TABLETS BY MOUTH 2 TO 3 TIMES DAILY AS NEEDED    . VITAMIN D, CHOLECALCIFEROL, PO Take 1 tablet by mouth daily.      No current facility-administered medications for this visit.   Allergies: Morphine and related and Naproxen sodium  No LMP recorded. Patient has had a hysterectomy.  Past medical history,surgical history, problem list, medications, allergies, family history and  social history were all reviewed and documented as reviewed in the EPIC chart.  ROS:  Feeling well. No dyspnea or chest pain on exertion.  No abdominal pain, change in bowel habits, black or bloody stools.  No urinary tract symptoms. GYN ROS: no abnormal bleeding, pelvic pain or discharge, no breast pain or new or enlarging lumps on self exam.  No neurological complaints.   OBJECTIVE:  BP (!) 136/84   Ht 5\' 1"  (1.549 m)   Wt 134 lb (60.8 kg)   BMI 25.32 kg/m  The patient appears well, alert, oriented x 3, in no distress. ENT normal.  Neck supple. No cervical or supraclavicular adenopathy or thyromegaly.  Lungs are clear, good air entry, no wheezes, rhonchi or rales. S1 and S2 normal, no murmurs, regular rate and rhythm.  Abdomen soft without tenderness, guarding, mass or organomegaly.  Neurological is normal, no focal findings.  BREAST EXAM: breasts appear normal, no suspicious masses, no skin or nipple changes or axillary nodes  PELVIC EXAM: VULVA: normal appearing vulva with no masses, tenderness or lesions, atrophic changes, VAGINA: normal appearing atrophic vagina with normal color and discharge, no lesions, CERVIX: surgically absent, UTERUS: surgically absent, vaginal cuff normal, ADNEXA: no masses, nontender  Chaperone: [name] present during the examination  ASSESSMENT:  74 y.o. G0P0 here for annual gynecologic exam  PLAN:   1. Postmenopausal.  Prior TAH LSO and subsequent RSO for endometriosis.  No significant hot flashes or night sweats.  No vaginal bleeding. 2. Pap smear 04/2010.  No history of abnormal Pap smears.  Comfortable with not continuing screening following the current guidelines based on age. 3. Mammogram 07/2019.  Normal breast exam today.  Continue with annual mammograms. 4. Colonoscopy 2016.  Recommended that she follow up at the recommended interval.   5. Osteopenia.  DEXA 10/2018.  T score -2.2 at distal third of forearm, FRAX 18% / 6.7%.  She ultimately  decided to follow conservative management with supplementation and exercise despite recommendation for starting osteoporosis treatment.  She has been more concerned about the potential for fracture and is considering Prolia injections, but would like to first get a follow-up DEXA first.  I did discuss that insurance may not cover that at 1 year interval but I had went ahead and ordered the testing per her request anyway and she will discuss these matters with the office and proceed if agreeable to her.  Courage vitamin D and calcium intake. 6. Health maintenance.  No labs today as she normally has these completed swear.  Return annually or sooner, prn.  Joseph Pierini MD 08/27/19

## 2019-08-31 ENCOUNTER — Other Ambulatory Visit: Payer: Medicare Other

## 2019-08-31 DIAGNOSIS — R05 Cough: Secondary | ICD-10-CM

## 2019-08-31 DIAGNOSIS — R059 Cough, unspecified: Secondary | ICD-10-CM

## 2019-09-03 ENCOUNTER — Other Ambulatory Visit: Payer: Medicare Other

## 2019-09-03 DIAGNOSIS — R059 Cough, unspecified: Secondary | ICD-10-CM

## 2019-09-07 ENCOUNTER — Telehealth: Payer: Self-pay | Admitting: Pulmonary Disease

## 2019-09-07 ENCOUNTER — Telehealth: Payer: Self-pay | Admitting: *Deleted

## 2019-09-07 DIAGNOSIS — Z8639 Personal history of other endocrine, nutritional and metabolic disease: Secondary | ICD-10-CM

## 2019-09-07 MED ORDER — SULFAMETHOXAZOLE-TRIMETHOPRIM 800-160 MG PO TABS
1.0000 | ORAL_TABLET | Freq: Two times a day (BID) | ORAL | 0 refills | Status: DC
Start: 2019-09-07 — End: 2020-12-25

## 2019-09-07 NOTE — Telephone Encounter (Signed)
ATC Patient.  LM to call back.  Per Dr Vaughan Browner-  Diana Garfinkel, MD  P [facility] Triage Pool Cc: [name], LPN Please let patient know that her sputum has positive culture and is growing a bacteria called enterobacter.   Call in Bactrim 1 DS tab every 12 hrs for 14 days

## 2019-09-07 NOTE — Telephone Encounter (Signed)
Patient returned call.  Dr Matilde Bash recommendations given.  Understanding stated.  Bactrim DS sent to requested [facility] pharmacy.  Nothing further at this time.

## 2019-09-07 NOTE — Telephone Encounter (Signed)
Pt request Vit D labs, hx of low vit D. Will route to Dr. Delilah Shan for approval. I will contact pt to schedule after Dr. Delilah Shan approval

## 2019-09-08 NOTE — Telephone Encounter (Signed)
Yes she can check vit D level

## 2019-09-08 NOTE — Telephone Encounter (Signed)
Pt states she will cal me back to schedule labs

## 2019-09-09 ENCOUNTER — Other Ambulatory Visit: Payer: Self-pay

## 2019-09-09 ENCOUNTER — Other Ambulatory Visit: Payer: Medicare Other

## 2019-09-09 DIAGNOSIS — Z8639 Personal history of other endocrine, nutritional and metabolic disease: Secondary | ICD-10-CM

## 2019-09-09 LAB — VITAMIN D 25 HYDROXY (VIT D DEFICIENCY, FRACTURES): Vit D, 25-Hydroxy: 36 ng/mL (ref 30–100)

## 2019-09-16 ENCOUNTER — Telehealth: Payer: Self-pay | Admitting: Pulmonary Disease

## 2019-09-16 MED ORDER — ONDANSETRON HCL 4 MG PO TABS: 4.0000 mg | ORAL_TABLET | Freq: Three times a day (TID) | ORAL | 0 refills | Status: AC | PRN

## 2019-09-16 MED ORDER — PANTOPRAZOLE SODIUM 40 MG PO TBEC: 40.0000 mg | DELAYED_RELEASE_TABLET | Freq: Every day | ORAL | 0 refills | Status: AC

## 2019-09-16 NOTE — Telephone Encounter (Signed)
Primary Pulmonologist: Mannam Last office visit and with whom:08/17/19 - Mannam What do we see them for (pulmonary problems): Asthma Last OV assessment/plan: Assessment:  Follow-up for chronic cough Suspect upper airway cough from postnasal drip, possibly silent acid reflux Symptoms improved with chlorpheniramine, Prilosec   Asthma PFTs reviewed with obstruction and significant bronchodilator response No evidence elevated IgE or FENO, mild elevation in peripheral eosinophilia   Switch back to Breo from Symbicort as she prefers the once a day inhaler   Abnormal CT chest Cardiac CT reviewed with mild bronchiectasis and tree-in-bud. We will try to get sputum for AFB to rule out MAI Schedule high-resolution CT for better evaluation of the lungs   If unable to get adequate samples then we may need to consider bronchoscopy   Plan/Recommendations: - Chlorphentermine PRN, Flonase - Prilosec - Start Symbicort and stop Breo - HRCT, sputum for culture   Was appointment offered to patient (explain)?  no   Reason for call: Pt started Bactrim DS BId  on 09/07/19. Pt states she has 5 days left but c/o stomach burning and lots of nausea for past 2 days. She states she has tried to tough it out but it has gotten worse. She is taking med on full stomach and is also eating yogurt. Has tried [employer]. Please advise.   (examples of things to ask: : When did symptoms start? Fever? Cough? Productive? Color to sputum? More sputum than usual? Wheezing? Have you needed increased oxygen? Are you taking your respiratory medications? What over the counter measures have you tried?)  Allergies  Allergen Reactions  . Morphine And Related Hives  . Naproxen Sodium Rash    Immunization History  Administered Date(s) Administered  . Influenza, High Dose Seasonal PF 11/05/2016, 10/27/2017, 11/22/2018  . PFIZER SARS-COV-2 Vaccination 02/19/2019, 03/11/2019  . Pneumococcal Conjugate-13 07/12/2014  .  Pneumococcal Polysaccharide-23 06/01/2010  . Tdap 08/08/2017  . Zoster Recombinat (Shingrix) 11/21/2016

## 2019-09-16 NOTE — Telephone Encounter (Signed)
Spoke with pt and advised of recommendations per Dr Vaughan Browner. Pt verbalized understanding. Rx sent to pharmacy. Nothing further needed at this time.

## 2019-09-16 NOTE — Telephone Encounter (Signed)
Order Protonix 40 mg a day for 7 days Zofran 4mg  PO every 8 hrs as needed for nausea

## 2019-09-29 LAB — RESPIRATORY CULTURE OR RESPIRATORY AND SPUTUM CULTURE
MICRO NUMBER:: 10781354
RESULT:: NORMAL
SPECIMEN QUALITY:: ADEQUATE

## 2019-09-29 LAB — FUNGUS CULTURE W SMEAR
CULTURE:: NO GROWTH
MICRO NUMBER:: 10781353
SMEAR:: NONE SEEN
SPECIMEN QUALITY:: ADEQUATE

## 2019-10-19 LAB — AFB CULTURE WITH SMEAR (NOT AT ARMC)
Acid Fast Culture: NEGATIVE
Acid Fast Smear: NEGATIVE

## 2019-11-22 ENCOUNTER — Ambulatory Visit: Payer: Medicare Other | Admitting: Podiatry

## 2019-11-22 ENCOUNTER — Encounter: Payer: Self-pay | Admitting: Podiatry

## 2019-11-22 ENCOUNTER — Other Ambulatory Visit: Payer: Self-pay

## 2019-11-22 DIAGNOSIS — B351 Tinea unguium: Secondary | ICD-10-CM

## 2019-11-22 DIAGNOSIS — M2012 Hallux valgus (acquired), left foot: Secondary | ICD-10-CM

## 2019-11-22 DIAGNOSIS — M79672 Pain in left foot: Secondary | ICD-10-CM

## 2019-11-22 DIAGNOSIS — M2041 Other hammer toe(s) (acquired), right foot: Secondary | ICD-10-CM | POA: Diagnosis not present

## 2019-11-22 DIAGNOSIS — M79671 Pain in right foot: Secondary | ICD-10-CM

## 2019-11-22 DIAGNOSIS — M79675 Pain in left toe(s): Secondary | ICD-10-CM

## 2019-11-22 DIAGNOSIS — M79674 Pain in right toe(s): Secondary | ICD-10-CM | POA: Diagnosis not present

## 2019-11-22 DIAGNOSIS — M2011 Hallux valgus (acquired), right foot: Secondary | ICD-10-CM

## 2019-11-22 DIAGNOSIS — M2042 Other hammer toe(s) (acquired), left foot: Secondary | ICD-10-CM

## 2019-11-22 DIAGNOSIS — L84 Corns and callosities: Secondary | ICD-10-CM | POA: Diagnosis not present

## 2019-11-27 ENCOUNTER — Encounter: Payer: Self-pay | Admitting: Podiatry

## 2019-11-27 NOTE — Progress Notes (Signed)
Subjective:  Patient ID: [place], female    DOB: 23-Feb-1945,  MRN: [id number]  Cristela Felt Fatima presents to clinic today for painful corn(s) b/l feet , callus(es) b/l feet and painful mycotic nails.  Pain interferes with ambulation. Aggravating factors include wearing enclosed shoe gear. Painful toenails interfere with ambulation. Aggravating factors include wearing enclosed shoe gear. Pain is relieved with periodic professional debridement. Painful corns and calluses are aggravated when weightbearing with and without shoegear. Pain is relieved with periodic professional debridement..  74 y.o. female presents with the above complaint.    Review of Systems: Negative except as noted in the HPI. Past Medical History:  Diagnosis Date  . Allergic rhinitis   . Anxiety   . Anxiousness   . Degenerative disc disease    thoracic,lumbar  . Hypercholesterolemia   . Hyperlipidemia   . Low back pain   . Moderate persistent asthma without complication 5/36/6440  . Muscle cramps   . Nail dystrophy 12/30/2018  . Osteopenia 10/2018   T score -2.2 distal third of forearm.  -1.6 left femoral neck FRAX 18% / 6.7%  . Seasonal allergies   . Sinusitis, acute   . URI (upper respiratory infection)    Past Surgical History:  Procedure Laterality Date  . ABDOMINAL HYSTERECTOMY  1986   TAH  LSO  . APPENDECTOMY  2009  . BREAST EXCISIONAL BIOPSY Left 1995  . BREAST SURGERY     Biopsy benign  . COLONOSCOPY WITH PROPOFOL N/A 12/27/2014   Procedure: COLONOSCOPY WITH PROPOFOL;  Surgeon: Garlan Fair, MD;  Location: [facility];  Service: Endoscopy;  Laterality: N/A;  . KNEE SURGERY     arthroscopic  . OOPHORECTOMY     LSO 86,RSO 95  . PELVIC LAPAROSCOPY  1995   RSO    Current Outpatient Medications:  .  Ascorbic Acid (VITAMIN C) 100 MG tablet, Take 100 mg by mouth daily.  , Disp: , Rfl:  .  augmented betamethasone dipropionate (DIPROLENE-AF) 0.05 % cream, APPLY TO AFFECTED AREA AS  NEEDED, Disp: 50 g, Rfl: 0 .  BREO ELLIPTA 200-25 MCG/INH AEPB, INHALE 1 PUFF INTO THE LUNGS DAILY FOR 1 DAY, Disp: 60 each, Rfl: 0 .  Calcium Carbonate-Vitamin D (CALCIUM + D PO), Take 1 tablet by mouth daily. , Disp: , Rfl:  .  chlorpheniramine (ALLERGY RELIEF) 4 MG tablet, Take 4 mg by mouth 2 (two) times daily as needed for allergies., Disp: , Rfl:  .  diazepam (VALIUM) 5 MG tablet, Take 5 mg by mouth daily as needed., Disp: , Rfl: 3 .  diclofenac sodium (VOLTAREN) 1 % GEL, APPLY 2 4 GRAMS 3 TO 4 TIMES DAILY, Disp: , Rfl:  .  fish oil-omega-3 fatty acids 1000 MG capsule, Take 2 g by mouth daily.  , Disp: , Rfl:  .  fluticasone furoate-vilanterol (BREO ELLIPTA) 200-25 MCG/INH AEPB, Inhale 1 puff into the lungs daily., Disp: 60 each, Rfl: 5 .  gabapentin (NEURONTIN) 100 MG capsule, Take 100-300 mg by mouth at bedtime as needed (pain or insomnia)., Disp: , Rfl:  .  glucosamine-chondroitin 500-400 MG tablet, Take 1 tablet by mouth daily.  , Disp: , Rfl:  .  meloxicam (MOBIC) 15 MG tablet, Take 7.5 mg by mouth daily., Disp: , Rfl: 2 .  methocarbamol (ROBAXIN) 750 MG tablet, Take 750 mg by mouth every 8 (eight) hours as needed., Disp: , Rfl:  .  ondansetron (ZOFRAN) 4 MG tablet, Take 1 tablet (4 mg total) by mouth  every 8 (eight) hours as needed for nausea or vomiting., Disp: 20 tablet, Rfl: 0 .  pantoprazole (PROTONIX) 40 MG tablet, Take 1 tablet (40 mg total) by mouth daily., Disp: 7 tablet, Rfl: 0 .  Specialty Vitamins Products (MAGNESIUM, AMINO ACID CHELATE,) 133 MG tablet, Take 1 tablet by mouth daily.  , Disp: , Rfl:  .  sulfamethoxazole-trimethoprim (BACTRIM DS) 800-160 MG tablet, Take 1 tablet by mouth 2 (two) times daily., Disp: 28 tablet, Rfl: 0 .  traMADol (ULTRAM) 50 MG tablet, TAKE 1 TO 2 TABLETS BY MOUTH 2 TO 3 TIMES DAILY AS NEEDED, Disp: , Rfl:  .  VITAMIN D, CHOLECALCIFEROL, PO, Take 1 tablet by mouth daily. , Disp: , Rfl:  Allergies  Allergen Reactions  . Morphine And Related  Hives  . Naproxen Sodium Rash   Social History   Occupational History  . Not on file  Tobacco Use  . Smoking status: Former Smoker    Packs/day: 0.00    Types: Cigarettes    Quit date: 1965    Years since quitting: 56.8  . Smokeless tobacco: Never Used  . Tobacco comment: one pack of cigarettes would last two weeks-10/29/17  Vaping Use  . Vaping Use: Never used  Substance and Sexual Activity  . Alcohol use: Yes    Alcohol/week: 2.0 standard drinks    Types: 2 Standard drinks or equivalent per week    Comment: occ  . Drug use: No  . Sexual activity: Not Currently    Birth control/protection: Post-menopausal, Surgical    Comment: 1st intercourse 74 yo-More than 5 partners    Objective:   Constitutional Embree Brawley is a pleasant 74 y.o. Caucasian female, in NAD. AAO x 3.   Vascular Capillary refill time to digits immediate b/l. Palpable pedal pulses b/l LE. Pedal hair present. Lower extremity skin temperature gradient within normal limits. No cyanosis or clubbing noted.  Neurologic Normal speech. Oriented to person, place, and time. Protective sensation intact 5/5 intact bilaterally with 10g monofilament b/l. Vibratory sensation intact b/l.  Dermatologic Pedal skin with normal turgor, texture and tone bilaterally. No open wounds bilaterally. No interdigital macerations bilaterally. Toenails 1-5 b/l elongated, discolored, dystrophic, thickened, crumbly with subungual debris and tenderness to dorsal palpation. Hyperkeratotic lesion(s) L 5th toe, R 2nd toe, R 5th toe, submet head 2 left foot and submet head 2 right foot.  No erythema, no edema, no drainage, no flocculence.  Orthopedic: Normal muscle strength 5/5 to all lower extremity muscle groups bilaterally. No pain crepitus or joint limitation noted with ROM b/l. Hallux valgus with bunion deformity noted b/l lower extremities. Hammertoes noted to the L 5th toe and R 5th toe.   Radiographs: None Assessment:   1. Pain due to  onychomycosis of toenails of both feet   2. Corns and callosities   3. Pain in both feet   4. Hallux valgus, acquired, bilateral   5. Acquired hammertoes of both feet    Plan:  Patient was evaluated and treated and all questions answered.  Onychomycosis with pain -Nails palliatively debridement as below -Educated on self-care  Procedure: Nail Debridement Rationale: Pain Type of Debridement: manual, sharp debridement. Instrumentation: Nail nipper, rotary burr. Number of Nails: 10 -Examined patient. -Toenails 1-5 b/l were debrided in length and girth with sterile nail nippers and dremel without iatrogenic bleeding.  -Corn(s) L 5th toe, R 2nd toe and R 5th toe and callus(es) submet head 2 left foot and submet head 2 right foot were pared  utilizing sterile scalpel blade without incident. Total number debrided =5.  Return in about 3 months (around 02/22/2020) for toenail debridement w/corn(s)/callus(es).  Marzetta Board, DPM

## 2020-02-18 DIAGNOSIS — R0981 Nasal congestion: Secondary | ICD-10-CM | POA: Diagnosis not present

## 2020-02-18 DIAGNOSIS — E7800 Pure hypercholesterolemia, unspecified: Secondary | ICD-10-CM | POA: Diagnosis not present

## 2020-02-18 DIAGNOSIS — R42 Dizziness and giddiness: Secondary | ICD-10-CM | POA: Diagnosis not present

## 2020-02-23 DIAGNOSIS — L720 Epidermal cyst: Secondary | ICD-10-CM | POA: Diagnosis not present

## 2020-02-23 DIAGNOSIS — L821 Other seborrheic keratosis: Secondary | ICD-10-CM | POA: Diagnosis not present

## 2020-02-23 DIAGNOSIS — Z85828 Personal history of other malignant neoplasm of skin: Secondary | ICD-10-CM | POA: Diagnosis not present

## 2020-02-28 ENCOUNTER — Other Ambulatory Visit: Payer: Self-pay

## 2020-02-28 ENCOUNTER — Ambulatory Visit: Payer: Medicare Other | Admitting: Podiatry

## 2020-02-28 ENCOUNTER — Encounter: Payer: Self-pay | Admitting: Podiatry

## 2020-02-28 DIAGNOSIS — B351 Tinea unguium: Secondary | ICD-10-CM

## 2020-02-28 DIAGNOSIS — M79675 Pain in left toe(s): Secondary | ICD-10-CM | POA: Diagnosis not present

## 2020-02-28 DIAGNOSIS — M79674 Pain in right toe(s): Secondary | ICD-10-CM | POA: Diagnosis not present

## 2020-02-28 DIAGNOSIS — M2011 Hallux valgus (acquired), right foot: Secondary | ICD-10-CM

## 2020-02-28 DIAGNOSIS — M79672 Pain in left foot: Secondary | ICD-10-CM

## 2020-02-28 DIAGNOSIS — M2041 Other hammer toe(s) (acquired), right foot: Secondary | ICD-10-CM

## 2020-02-28 DIAGNOSIS — M2042 Other hammer toe(s) (acquired), left foot: Secondary | ICD-10-CM

## 2020-02-28 DIAGNOSIS — M2012 Hallux valgus (acquired), left foot: Secondary | ICD-10-CM

## 2020-02-28 DIAGNOSIS — L84 Corns and callosities: Secondary | ICD-10-CM | POA: Diagnosis not present

## 2020-02-28 DIAGNOSIS — M79671 Pain in right foot: Secondary | ICD-10-CM | POA: Diagnosis not present

## 2020-02-28 NOTE — Progress Notes (Signed)
Subjective:  Patient ID: Diana Cisneros, female    DOB: September 01, 1945,  MRN: [id number]  Diana Cisneros presents to clinic today for painful corn(s) b/l feet , callus(es) b/l feet and painful mycotic nails.  Pain interferes with ambulation. Aggravating factors include wearing enclosed shoe gear. Painful toenails interfere with ambulation. Aggravating factors include wearing enclosed shoe gear. Pain is relieved with periodic professional debridement. Painful corns and calluses are aggravated when weightbearing with and without shoegear. Pain is relieved with periodic professional debridement.   She states her left 5th digit is sore today. Denies any redness or swelling, but shoe gear irritates it.  75 y.o. female presents with the above complaint.  Her PCP is Seward Adreona and last visit was 02/18/2020.  Review of Systems: Negative except as noted in the HPI. Past Medical History:  Diagnosis Date  . Allergic rhinitis   . Anxiety   . Anxiousness   . Degenerative disc disease    thoracic,lumbar  . Hypercholesterolemia   . Hyperlipidemia   . Low back pain   . Moderate persistent asthma without complication [id number]  . Muscle cramps   . Nail dystrophy 12/30/2018  . Osteopenia 10/2018   T score -2.2 distal third of forearm.  -1.6 left femoral neck FRAX 18% / 6.7%  . Seasonal allergies   . Sinusitis, acute   . URI (upper respiratory infection)    Past Surgical History:  Procedure Laterality Date  . ABDOMINAL HYSTERECTOMY  1986   TAH  LSO  . APPENDECTOMY  2009  . BREAST EXCISIONAL BIOPSY Left 1995  . BREAST SURGERY     Biopsy benign  . COLONOSCOPY WITH PROPOFOL N/A 12/27/2014   Procedure: COLONOSCOPY WITH PROPOFOL;  Surgeon: Garlan Fair, MD;  Location: [facility];  Service: Endoscopy;  Laterality: N/A;  . KNEE SURGERY     arthroscopic  . OOPHORECTOMY     LSO 86,RSO 95  . PELVIC LAPAROSCOPY  1995   RSO    Current Outpatient Medications:  .  Ascorbic Acid (VITAMIN C) 100 MG  tablet, Take 100 mg by mouth daily.  , Disp: , Rfl:  .  Ascorbic Acid (VITAMIN C) 500 MG CHEW, See admin instructions., Disp: , Rfl:  .  augmented betamethasone dipropionate (DIPROLENE-AF) 0.05 % cream, APPLY TO AFFECTED AREA AS NEEDED, Disp: 50 g, Rfl: 0 .  BREO ELLIPTA 200-25 MCG/INH AEPB, INHALE 1 PUFF INTO THE LUNGS DAILY FOR 1 DAY, Disp: 60 each, Rfl: 0 .  Calcium Carbonate+Vitamin D (CALCIUM 600 + D) 600-200 MG-UNIT TABS, 1 tablet, Disp: , Rfl:  .  Calcium Carbonate-Vitamin D (CALCIUM + D PO), Take 1 tablet by mouth daily. , Disp: , Rfl:  .  chlorpheniramine (ALLERGY RELIEF) 4 MG tablet, Take 4 mg by mouth 2 (two) times daily as needed for allergies., Disp: , Rfl:  .  cholecalciferol (VITAMIN D3) 25 MCG (1000 UNIT) tablet, 1 tablet, Disp: , Rfl:  .  diazepam (VALIUM) 5 MG tablet, Take 5 mg by mouth daily as needed., Disp: , Rfl: 3 .  diclofenac sodium (VOLTAREN) 1 % GEL, APPLY 2 4 GRAMS 3 TO 4 TIMES DAILY, Disp: , Rfl:  .  fish oil-omega-3 fatty acids 1000 MG capsule, Take 2 g by mouth daily.  , Disp: , Rfl:  .  fluticasone furoate-vilanterol (BREO ELLIPTA) 100-25 MCG/INH AEPB, 1 puff, Disp: , Rfl:  .  fluticasone furoate-vilanterol (BREO ELLIPTA) 200-25 MCG/INH AEPB, Inhale 1 puff into the lungs daily., Disp: 60 each, Rfl: 5 .  fluvastatin (LESCOL) 20 MG capsule, Take 20 mg by mouth daily., Disp: , Rfl:  .  fluvastatin (LESCOL) 20 MG capsule, 1 capsule, Disp: , Rfl:  .  gabapentin (NEURONTIN) 100 MG capsule, Take 100-300 mg by mouth at bedtime as needed (pain or insomnia)., Disp: , Rfl:  .  gabapentin (NEURONTIN) 100 MG capsule, 1 capsule, Disp: , Rfl:  .  Glucosamine-Chondroit-Vit C-Mn (GLUCOSAMINE CHONDR 1500 COMPLX PO), See admin instructions., Disp: , Rfl:  .  glucosamine-chondroitin 500-400 MG tablet, Take 1 tablet by mouth daily.  , Disp: , Rfl:  .  Magnesium 300 MG CAPS, See admin instructions., Disp: , Rfl:  .  meloxicam (MOBIC) 15 MG tablet, Take 7.5 mg by mouth daily., Disp: ,  Rfl: 2 .  methocarbamol (ROBAXIN) 750 MG tablet, Take 750 mg by mouth every 8 (eight) hours as needed., Disp: , Rfl:  .  Misc Natural Products (OSTEO BI-FLEX JOINT SHIELD PO), See admin instructions., Disp: , Rfl:  .  Omega-3 Fatty Acids (FISH OIL) 1000 MG CAPS, See admin instructions., Disp: , Rfl:  .  ondansetron (ZOFRAN) 4 MG tablet, Take 1 tablet (4 mg total) by mouth every 8 (eight) hours as needed for nausea or vomiting., Disp: 20 tablet, Rfl: 0 .  pantoprazole (PROTONIX) 40 MG tablet, Take 1 tablet (40 mg total) by mouth daily., Disp: 7 tablet, Rfl: 0 .  Potassium 75 MG TABS, See admin instructions., Disp: , Rfl:  .  PRALUENT 150 MG/ML SOAJ, , Disp: , Rfl:  .  Specialty Vitamins Products (MAGNESIUM, AMINO ACID CHELATE,) 133 MG tablet, Take 1 tablet by mouth daily.  , Disp: , Rfl:  .  sulfamethoxazole-trimethoprim (BACTRIM DS) 800-160 MG tablet, Take 1 tablet by mouth 2 (two) times daily., Disp: 28 tablet, Rfl: 0 .  SUPER B COMPLEX/C PO, See admin instructions., Disp: , Rfl:  .  traMADol (ULTRAM) 50 MG tablet, TAKE 1 TO 2 TABLETS BY MOUTH 2 TO 3 TIMES DAILY AS NEEDED, Disp: , Rfl:  .  vitamin B-12 (CYANOCOBALAMIN) 100 MCG tablet, 1 tablet, Disp: , Rfl:  .  VITAMIN D, CHOLECALCIFEROL, PO, Take 1 tablet by mouth daily. , Disp: , Rfl:  Allergies  Allergen Reactions  . Morphine And Related Hives  . Morphine Sulfate     Other reaction(s): itching/redface  . Other     Other reaction(s): rash/lips swelling  . Naproxen Sodium Rash   Social History   Occupational History  . Not on file  Tobacco Use  . Smoking status: Former Smoker    Packs/day: 0.00    Types: Cigarettes    Quit date: 1965    Years since quitting: 63.1  . Smokeless tobacco: Never Used  . Tobacco comment: one pack of cigarettes would last two weeks-10/29/17  Vaping Use  . Vaping Use: Never used  Substance and Sexual Activity  . Alcohol use: Yes    Alcohol/week: 2.0 standard drinks    Types: 2 Standard drinks or  equivalent per week    Comment: occ  . Drug use: No  . Sexual activity: Not Currently    Birth control/protection: Post-menopausal, Surgical    Comment: 1st intercourse 75 yo-More than 5 partners    Objective:   Constitutional PRAPTI GRUSSING is a pleasant 75 y.o. Caucasian female, in NAD. AAO x 3.   Vascular Capillary refill time to digits immediate b/l. Palpable pedal pulses b/l LE. Pedal hair present. Lower extremity skin temperature gradient within normal limits. No cyanosis or clubbing noted.  Neurologic Normal speech. Oriented to person, place, and time. Protective sensation intact 5/5 intact bilaterally with 10g monofilament b/l. Vibratory sensation intact b/l.  Dermatologic Pedal skin with normal turgor, texture and tone bilaterally. No open wounds bilaterally. No interdigital macerations bilaterally. Toenails 1-5 b/l elongated, discolored, dystrophic, thickened, crumbly with subungual debris and tenderness to dorsal palpation. Hyperkeratotic lesion(s) L 2nd toe, L 5th toe, R 2nd toe, R 5th toe and submet head 5 right foot.  No erythema, no edema, no drainage, no fluctuance.  Orthopedic: Normal muscle strength 5/5 to all lower extremity muscle groups bilaterally. No pain crepitus or joint limitation noted with ROM b/l. Hallux valgus with bunion deformity noted b/l lower extremities. Hammertoes noted to the L 5th toe and R 5th toe.   Radiographs: None Assessment:   1. Pain due to onychomycosis of toenails of both feet   2. Corns and callosities   3. Pain in both feet   4. Hallux valgus, acquired, bilateral   5. Acquired hammertoes of both feet    Plan:  Patient was evaluated and treated and all questions answered.  Onychomycosis with pain -Nails palliatively debridement as below -Educated on self-care  Procedure: Nail Debridement Rationale: Pain Type of Debridement: manual, sharp debridement. Instrumentation: Nail nipper, rotary burr. Number of Nails: 10 -Examined  patient. -Toenails 1-5 b/l were debrided in length and girth with sterile nail nippers and dremel without iatrogenic bleeding.  -Corn(s) L 2nd toe, L 5th toe, R 2nd toe and R 5th toe and callus(es) submet head 5 right foot were pared utilizing sterile scalpel blade without incident. Total number debrided =5.  Return in about 9 weeks (around 05/01/2020).  Marzetta Board, DPM

## 2020-03-27 ENCOUNTER — Other Ambulatory Visit: Payer: Self-pay | Admitting: Pulmonary Disease

## 2020-05-01 ENCOUNTER — Ambulatory Visit: Payer: Medicare Other | Admitting: Podiatry

## 2020-05-05 ENCOUNTER — Ambulatory Visit: Payer: Medicare Other | Admitting: Podiatry

## 2020-05-11 DIAGNOSIS — L821 Other seborrheic keratosis: Secondary | ICD-10-CM | POA: Diagnosis not present

## 2020-05-11 DIAGNOSIS — Z85828 Personal history of other malignant neoplasm of skin: Secondary | ICD-10-CM | POA: Diagnosis not present

## 2020-05-11 DIAGNOSIS — L570 Actinic keratosis: Secondary | ICD-10-CM | POA: Diagnosis not present

## 2020-05-11 DIAGNOSIS — D2272 Melanocytic nevi of left lower limb, including hip: Secondary | ICD-10-CM | POA: Diagnosis not present

## 2020-05-11 DIAGNOSIS — L820 Inflamed seborrheic keratosis: Secondary | ICD-10-CM | POA: Diagnosis not present

## 2020-05-11 DIAGNOSIS — D2271 Melanocytic nevi of right lower limb, including hip: Secondary | ICD-10-CM | POA: Diagnosis not present

## 2020-05-11 DIAGNOSIS — D225 Melanocytic nevi of trunk: Secondary | ICD-10-CM | POA: Diagnosis not present

## 2020-05-11 DIAGNOSIS — L720 Epidermal cyst: Secondary | ICD-10-CM | POA: Diagnosis not present

## 2020-05-15 ENCOUNTER — Other Ambulatory Visit: Payer: Self-pay

## 2020-05-15 ENCOUNTER — Ambulatory Visit: Payer: Medicare Other | Admitting: Podiatrist

## 2020-05-15 ENCOUNTER — Encounter: Payer: Self-pay | Admitting: Podiatrist

## 2020-05-15 DIAGNOSIS — M79675 Pain in left toe(s): Secondary | ICD-10-CM | POA: Diagnosis not present

## 2020-05-15 DIAGNOSIS — B351 Tinea unguium: Secondary | ICD-10-CM

## 2020-05-15 DIAGNOSIS — M79674 Pain in right toe(s): Secondary | ICD-10-CM

## 2020-05-15 NOTE — Progress Notes (Signed)
    Chief Complaint  Patient presents with  . Nail Problem    Routine foot care     HPI: Diana Cisneros presents to clinic today for painful mycotic nails of both feet.   Painful toenails interfere with ambulation. Pain is relieved with periodic professional debridement. Patient presents today in a pair of [employer]OK[employer] shoes she relates are helpful.    She states her tips of the second toes bilateral are sore today. Denies any redness or swelling, but shoe gear irritates it.   75 y.o. female presents with the above complaint.  Her PCP is Seward Maddilynn and last visit was 02/18/2020.   Patient Active Problem List   Diagnosis Date Noted  . Hyperlipidemia 04/26/2019  . Statin myopathy 04/26/2019  . Nail dystrophy 12/30/2018  . Moderate persistent asthma without complication 35/57/3220  . Degenerative disc disease       Allergies  Allergen Reactions  . Morphine And Related Hives  . Morphine Sulfate     Other reaction(s): itching/redface  . Other     Other reaction(s): rash/lips swelling  . Naproxen Sodium Rash    Review of Systems No fevers, chills, nausea, muscle aches, no difficulty breathing, no calf pain, no chest pain or shortness of breath.   Physical Exam   Constitutional BREAWNA MONTENEGRO is a pleasant 75 y.o. Caucasian female, in NAD. AAO x 3.   Vascular Capillary refill time to digits immediate b/l. Palpable pedal pulses b/l LE. Pedal hair present. Lower extremity skin temperature gradient within normal limits. No cyanosis or clubbing noted.  Neurologic Normal speech. Oriented to person, place, and time. Protective sensation intact 5/5 intact bilaterally with 10g monofilament b/l. Vibratory sensation intact b/l.  Dermatologic Pedal skin with normal turgor, texture and tone bilaterally. No open wounds bilaterally. No interdigital macerations bilaterally. Toenails 1-5 b/l elongated, discolored, dystrophic, thickened, crumbly with subungual debris and tenderness to dorsal palpation.  No erythema, no edema, no drainage, no fluctuance.  Orthopedic: Normal muscle strength 5/5 to all lower extremity muscle groups bilaterally. No pain crepitus or joint limitation noted with ROM b/l. Hallux valgus with bunion deformity noted b/l lower extremities. Hammertoes noted to the L 5th toe and R 5th toe.        Assessment     ICD-10-CM   1. Pain due to onychomycosis of toenails of both feet  B35.1    M79.675    M79.674      Plan  Debridement of toenails was recommended.  Onychoreduction of symptomatic toenails was performed via nail nipper and power burr without iatrogenic incident.  Patient was instructed on signs and symptoms of infection and was told to call immediately should any of these arise.   She will return in 3 months for continued at risk foot care- if any concerns arise prior to that visit, she will call.

## 2020-07-07 ENCOUNTER — Ambulatory Visit: Payer: Medicare Other | Admitting: Podiatry

## 2020-07-10 ENCOUNTER — Other Ambulatory Visit: Payer: Self-pay | Admitting: Obstetrics and Gynecology

## 2020-07-10 DIAGNOSIS — Z1231 Encounter for screening mammogram for malignant neoplasm of breast: Secondary | ICD-10-CM

## 2020-07-19 ENCOUNTER — Ambulatory Visit (INDEPENDENT_AMBULATORY_CARE_PROVIDER_SITE_OTHER): Payer: Medicare Other | Admitting: Podiatry

## 2020-07-19 ENCOUNTER — Other Ambulatory Visit: Payer: Self-pay

## 2020-07-19 DIAGNOSIS — F411 Generalized anxiety disorder: Secondary | ICD-10-CM | POA: Insufficient documentation

## 2020-07-19 DIAGNOSIS — M79674 Pain in right toe(s): Secondary | ICD-10-CM

## 2020-07-19 DIAGNOSIS — I700 Atherosclerosis of aorta: Secondary | ICD-10-CM | POA: Insufficient documentation

## 2020-07-19 DIAGNOSIS — J309 Allergic rhinitis, unspecified: Secondary | ICD-10-CM | POA: Insufficient documentation

## 2020-07-19 DIAGNOSIS — J019 Acute sinusitis, unspecified: Secondary | ICD-10-CM | POA: Insufficient documentation

## 2020-07-19 DIAGNOSIS — M79675 Pain in left toe(s): Secondary | ICD-10-CM

## 2020-07-19 DIAGNOSIS — J069 Acute upper respiratory infection, unspecified: Secondary | ICD-10-CM | POA: Insufficient documentation

## 2020-07-19 DIAGNOSIS — E7800 Pure hypercholesterolemia, unspecified: Secondary | ICD-10-CM | POA: Insufficient documentation

## 2020-07-19 DIAGNOSIS — B351 Tinea unguium: Secondary | ICD-10-CM

## 2020-07-21 ENCOUNTER — Other Ambulatory Visit: Payer: Self-pay

## 2020-07-21 ENCOUNTER — Encounter: Payer: Self-pay | Admitting: Podiatry

## 2020-07-21 ENCOUNTER — Ambulatory Visit: Payer: Medicare Other | Admitting: Podiatry

## 2020-07-21 DIAGNOSIS — L84 Corns and callosities: Secondary | ICD-10-CM

## 2020-07-21 DIAGNOSIS — M79671 Pain in right foot: Secondary | ICD-10-CM

## 2020-07-21 DIAGNOSIS — M79672 Pain in left foot: Secondary | ICD-10-CM

## 2020-07-21 DIAGNOSIS — M79674 Pain in right toe(s): Secondary | ICD-10-CM | POA: Diagnosis not present

## 2020-07-21 DIAGNOSIS — M79675 Pain in left toe(s): Secondary | ICD-10-CM | POA: Diagnosis not present

## 2020-07-21 DIAGNOSIS — B351 Tinea unguium: Secondary | ICD-10-CM | POA: Diagnosis not present

## 2020-07-23 NOTE — Progress Notes (Signed)
Subjective: Diana Cisneros is a pleasant 75 y.o. female patient seen today for corns, calluses and painful thick toenails that are difficult to trim. Pain interferes with ambulation. Aggravating factors include wearing enclosed shoe gear. Pain is relieved with periodic professional debridement.  PCP is Seward Dalphine, MD. Last visit was: 06/16/2020.  She voices no new pedal problems on today's visit.   Allergies  Allergen Reactions   Morphine And Related Hives   Morphine Sulfate     Other reaction(s): itching/redface   Other     Other reaction(s): rash/lips swelling   Naproxen Sodium Rash    Objective: Physical Exam  General: Diana Cisneros is a pleasant 75 y.o. Caucasian female, WD, WN in NAD. AAO x 3.   Vascular:  Capillary refill time to digits immediate b/l. Palpable DP pulse(s) b/l lower extremities Palpable PT pulse(s) b/l lower extremities Pedal hair present. Lower extremity skin temperature gradient within normal limits. No pain with calf compression b/l. No edema noted b/l lower extremities.  Dermatological:  Toenails 1-5 b/l elongated, discolored, dystrophic, thickened, crumbly with subungual debris and tenderness to dorsal palpation. Hyperkeratotic lesion(s) L 2nd toe, L 5th toe, R 2nd toe, R 5th toe, and submet head 5 right foot.  No erythema, no edema, no drainage, no fluctuance.  Musculoskeletal:  Normal muscle strength 5/5 to all lower extremity muscle groups bilaterally. No pain crepitus or joint limitation noted with ROM b/l. Hallux valgus with bunion deformity noted b/l lower extremities. Hammertoe(s) noted to the L 5th toe and R 5th toe.  Neurological:  Protective sensation intact 5/5 intact bilaterally with 10g monofilament b/l. Vibratory sensation intact b/l.  Assessment and Plan:  1. Pain due to onychomycosis of toenails of both feet   2. Corns and callosities   3. Pain in both feet    -Examined patient. -Patient to continue soft, supportive shoe gear  daily. -Toenails 1-5 b/l were debrided in length and girth with sterile nail nippers and dremel without iatrogenic bleeding.  -Corn(s) L 2nd toe, L 5th toe, R 2nd toe, and R 5th toe and callus(es) submet head 5 right foot were pared utilizing sterile scalpel blade without incident. Total number debrided =5. -Patient to report any pedal injuries to medical professional immediately. -Patient/POA to call should there be question/concern in the interim.  Return in about 9 weeks (around 09/22/2020).  Marzetta Board, DPM

## 2020-07-31 NOTE — Progress Notes (Signed)
Patient had to leave for another appt. Rescheduled.

## 2020-08-28 ENCOUNTER — Encounter: Payer: Medicare Other | Admitting: Obstetrics and Gynecology

## 2020-08-31 ENCOUNTER — Other Ambulatory Visit: Payer: Self-pay

## 2020-08-31 ENCOUNTER — Ambulatory Visit
Admission: RE | Admit: 2020-08-31 | Discharge: 2020-08-31 | Disposition: A | Discharge status: Home / Self Care | Payer: Medicare Other | Source: Ambulatory Visit | Attending: Obstetrics and Gynecology | Admitting: Obstetrics and Gynecology

## 2020-08-31 DIAGNOSIS — Z1231 Encounter for screening mammogram for malignant neoplasm of breast: Secondary | ICD-10-CM | POA: Diagnosis not present

## 2020-09-27 ENCOUNTER — Other Ambulatory Visit: Payer: Self-pay | Admitting: Pulmonary Disease

## 2020-10-03 ENCOUNTER — Other Ambulatory Visit: Payer: Self-pay

## 2020-10-03 ENCOUNTER — Ambulatory Visit: Payer: Medicare Other | Admitting: Pulmonary Disease

## 2020-10-03 ENCOUNTER — Encounter: Payer: Self-pay | Admitting: Pulmonary Disease

## 2020-10-03 VITALS — BP 142/70 | HR 64 | Temp 97.8°F | Ht 62.0 in | Wt 129.0 lb

## 2020-10-03 DIAGNOSIS — R059 Cough, unspecified: Secondary | ICD-10-CM

## 2020-10-03 DIAGNOSIS — J4540 Moderate persistent asthma, uncomplicated: Secondary | ICD-10-CM | POA: Diagnosis not present

## 2020-10-03 MED ORDER — FLUTICASONE FUROATE-VILANTEROL 200-25 MCG/INH IN AEPB: INHALATION_SPRAY | RESPIRATORY_TRACT | 5 refills | Status: DC

## 2020-10-03 NOTE — Progress Notes (Signed)
Diana Cisneros    OR:[id number]    [birth year]  Primary Care Physician:Polite, Jori Moll, MD  Referring Physician: Seward Dorreen, MD [address] [place],  [place] [address]  Chief complaint: Follow-up for cough, asthma  HPI: 75 year old with history of allergic rhinitis, hyperlipidemia.  Complains of chronic cough for the past 1 to 2 months associated with clear to white mucus.  She has minimal dyspnea on exertion.  No symptoms at rest.  No wheezing, fevers, chills Cough is exacerbated on tilting her neck back and lying down.  She has history of allergic rhinitis, postnasal drip and takes loratadine at night.  No symptoms of GERD.  Pets: Has a dog and a cat. 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  No recent exposure.  No dampness, Jacuzzi, hot tub. Smoking history: Minimal smoking as a teenager. Travel history: Originally from [place].  Lived in [place].  No significant recent travel. Relevant family history: No significant family history of lung disease.  Interim history: Continues on Breo inhaler without any issue.  She previously had cramps several years ago on breo but appears to have resolved now Has not needed to use her rescue inhaler.  No cough, sputum production, fevers, chills.  Outpatient Encounter Medications as of 10/03/2020  Medication Sig   Ascorbic Acid (VITAMIN C) 100 MG tablet Take 100 mg by mouth daily.     Ascorbic Acid (VITAMIN C) 500 MG CHEW See admin instructions.   augmented betamethasone dipropionate (DIPROLENE-AF) 0.05 % cream APPLY TO AFFECTED AREA AS NEEDED   BREO ELLIPTA 200-25 MCG/INH AEPB TAKE 1 PUFF BY MOUTH EVERY DAY   Calcium Carbonate+Vitamin D 600-200 MG-UNIT TABS 1 tablet   Calcium Carbonate-Vitamin D (CALCIUM + D PO) Take 1 tablet by mouth daily.    chlorpheniramine (CHLOR-TRIMETON) 4 MG tablet Take 4 mg by  mouth 2 (two) times daily as needed for allergies.   cholecalciferol (VITAMIN D3) 25 MCG (1000 UNIT) tablet 1 tablet   diazepam (VALIUM) 5 MG tablet Take 5 mg by mouth daily as needed.   diclofenac sodium (VOLTAREN) 1 % GEL APPLY 2 4 GRAMS 3 TO 4 TIMES DAILY   fish oil-omega-3 fatty acids 1000 MG capsule Take 2 g by mouth daily.     fluvastatin (LESCOL) 20 MG capsule Take 20 mg by mouth daily.   fluvastatin (LESCOL) 20 MG capsule 1 capsule   gabapentin (NEURONTIN) 100 MG capsule Take 100-300 mg by mouth at bedtime as needed (pain or insomnia).   gabapentin (NEURONTIN) 100 MG capsule 1 capsule   Glucosamine-Chondroit-Vit C-Mn (GLUCOSAMINE CHONDR 1500 COMPLX PO) See admin instructions.   glucosamine-chondroitin 500-400 MG tablet Take 1 tablet by mouth daily.     Magnesium 300 MG CAPS See admin instructions.   meloxicam (MOBIC) 15 MG tablet Take 7.5 mg by mouth daily.   methocarbamol (ROBAXIN) 750 MG tablet Take 750 mg by mouth every 8 (eight) hours as needed.   Misc Natural Products (OSTEO BI-FLEX JOINT SHIELD PO) See admin instructions.   Omega-3 Fatty Acids (FISH OIL) 1000 MG CAPS See admin instructions.   ondansetron (ZOFRAN) 4 MG tablet Take 1 tablet (4 mg total) by mouth every 8 (eight) hours as needed for nausea or vomiting.   pantoprazole (PROTONIX) 40 MG tablet Take 1 tablet (40 mg total) by mouth daily.   Potassium 75  MG TABS See admin instructions.   PRALUENT 150 MG/ML SOAJ    Specialty Vitamins Products (MAGNESIUM, AMINO ACID CHELATE,) 133 MG tablet Take 1 tablet by mouth daily.     sulfamethoxazole-trimethoprim (BACTRIM DS) 800-160 MG tablet Take 1 tablet by mouth 2 (two) times daily.   SUPER B COMPLEX/C PO See admin instructions.   traMADol (ULTRAM) 50 MG tablet TAKE 1 TO 2 TABLETS BY MOUTH 2 TO 3 TIMES DAILY AS NEEDED   vitamin B-12 (CYANOCOBALAMIN) 100 MCG tablet 1 tablet   VITAMIN D, CHOLECALCIFEROL, PO Take 1 tablet by mouth daily.    Zinc 50 MG TABS 1 tablet    [DISCONTINUED] BREO ELLIPTA 200-25 MCG/INH AEPB INHALE 1 PUFF INTO THE LUNGS DAILY FOR 1 DAY   No facility-administered encounter medications on file as of 10/03/2020.   Physical Exam: Blood pressure (!) 142/70, pulse 64, temperature 97.8 F (36.6 C), temperature source Oral, height '5\' 2"'$  (1.575 m), weight 129 lb (58.5 kg), SpO2 98 %. Gen:      No acute distress HEENT:  EOMI, sclera anicteric Neck:     No masses; no thyromegaly Lungs:    Clear to auscultation bilaterally; normal respiratory effort CV:         Regular rate and rhythm; no murmurs Abd:      + bowel sounds; soft, non-tender; no palpable masses, no distension Ext:    No edema; adequate peripheral perfusion Skin:      Warm and dry; no rash Neuro: alert and oriented x 3 Psych: normal mood and affect   Data Reviewed: Imaging: CT chest 04/09/2007-visualized lung bases are normal. Chest x-ray 06/01/2015-clear lungs, no acute cardiopulmonary abnormality. Chest x-ray 10/29/2017- no active lung disease, chronic scarring the right middle lobe and lingula. Cardiac CT 05/31/2019-visualized lungs show mild bronchiectasis with tree-in-bud appearance. High-resolution CT 08/26/2019-mild areas of centrilobular nodularity, tree-in-bud with bronchial plugging. I have reviewed the images personally.  PFTs: 11/24/2017 FVC 2.35 [89%), FEV1 1.53 [77%), F/F 65 post FEV1 1.90 [96%, +23%), TLC 115%, DLCO 18% Moderate obstruction with significant bronchodilator response and air trapping.  FENO 10/29/2017-18 FENO 11/24/2017-23  ACT score 04/09/2019-22  Labs: CBC 10/29/2017-WBC 5.2, eos 2.8%, absolute eosinophil count 146 CBC 04/01/2019-WBC 5.4, eos 2.1%, absolute eosinophil count 113  Respiratory allergy profile allergy profile 10/29/2017-IgE 31, RAST panel-negative Respiratory allergy profile 04/09/2019-IgE 28, RAST panel negative  Sputum cultures 08/31/2019-Enterobacter, AFB is negative  Assessment:  Follow-up for chronic cough Suspect upper airway  cough from postnasal drip, possibly silent acid reflux Symptoms improved with chlorpheniramine, Prilosec  Asthma PFTs reviewed with obstruction and significant bronchodilator response No evidence elevated IgE or FENO, mild elevation in peripheral eosinophilia  Stable on Breo  Mild bronchiectasis Has mild bronchiectasis on CT scan with tree-in-bud.  Treated for Enterobacter in sputum in 2021.  Sputum for AFB is negative Currently asymptomatic.  We will continue to monitor Follow-up high-res CT in 1 year   Plan/Recommendations: - Chlorphentermine PRN, Flonase - Prilosec - Continue Breo - HRCT in 1 year.  Marshell Garfinkel MD [facility] 10/03/2020, 9:52 AM  CC: Seward Lakecia, MD

## 2020-10-03 NOTE — Patient Instructions (Signed)
Glad you are doing well with regard to your breathing Continue the Breo inhaler We will order a high-resolution CT in 1 year for follow-up of bronchiectasis Return to clinic in 1 year after CT scan

## 2020-10-03 NOTE — Addendum Note (Signed)
Addended by: Darliss Ridgel on: 10/03/2020 10:30 AM   Modules accepted: Orders

## 2020-10-17 NOTE — Progress Notes (Signed)
75 y.o. G0P0 Married White or Caucasian Not Hispanic or Latino female here for annual exam.  H/O hysterectomy. Not sexually active.   Patient states that she is having frequent urination and urgency to void. No dysuria. She is up 4-5 x a night to void. Voids normal to small amounts. Sometimes her stream is slow, sometimes full force. Just occasional GSI.  Normal BM  She has leg cramps at night.     No LMP recorded. Patient has had a hysterectomy.          Sexually active: No.  The current method of family planning is status post hysterectomy.    Exercising: Yes.     Works with dogs daily.  Smoker:  no  Health Maintenance: Pap:  05/10/10 WNL,  History of abnormal Pap:  no MMG:  09/03/20 Density B Bi-rads 1 neg  BMD:   11/11/18 low bone mass, t score of -2.2, FRAX 18/6.7 Colonoscopy: yes was told she did not need any more.  TDaP:  08/08/17  Gardasil: none    reports that she quit smoking about 57 years ago. Her smoking use included cigarettes. She has never used smokeless tobacco. She reports current alcohol use of about 2.0 standard drinks per week. She reports that she does not use drugs. No children. She is involved in dog rescue.   Past Medical History:  Diagnosis Date   Allergic rhinitis    Anxiety    Anxiousness    Degenerative disc disease    thoracic,lumbar   Hypercholesterolemia    Hyperlipidemia    Low back pain    Moderate persistent asthma without complication 0/34/7425   Muscle cramps    Nail dystrophy 12/30/2018   Osteopenia 10/2018   T score -2.2 distal third of forearm.  -1.6 left femoral neck FRAX 18% / 6.7%   Seasonal allergies    Sinusitis, acute    URI (upper respiratory infection)     Past Surgical History:  Procedure Laterality Date   ABDOMINAL HYSTERECTOMY  1986   TAH  LSO   APPENDECTOMY  2009   BREAST EXCISIONAL BIOPSY Left 1995   BREAST SURGERY     Biopsy benign   COLONOSCOPY WITH PROPOFOL N/A 12/27/2014   Procedure: COLONOSCOPY WITH PROPOFOL;   Surgeon: Garlan Fair, MD;  Location: [facility];  Service: Endoscopy;  Laterality: N/A;   KNEE SURGERY     arthroscopic   OOPHORECTOMY     LSO 86,RSO 95   PELVIC LAPAROSCOPY  1995   RSO    Current Outpatient Medications  Medication Sig Dispense Refill   Ascorbic Acid (VITAMIN C) 100 MG tablet Take 100 mg by mouth daily.       Ascorbic Acid (VITAMIN C) 500 MG CHEW See admin instructions.     augmented betamethasone dipropionate (DIPROLENE-AF) 0.05 % cream APPLY TO AFFECTED AREA AS NEEDED 50 g 0   Calcium Carbonate+Vitamin D 600-200 MG-UNIT TABS 1 tablet     Calcium Carbonate-Vitamin D (CALCIUM + D PO) Take 1 tablet by mouth daily.      chlorpheniramine (CHLOR-TRIMETON) 4 MG tablet Take 4 mg by mouth 2 (two) times daily as needed for allergies.     cholecalciferol (VITAMIN D3) 25 MCG (1000 UNIT) tablet 1 tablet     diazepam (VALIUM) 5 MG tablet Take 5 mg by mouth daily as needed.  3   diclofenac sodium (VOLTAREN) 1 % GEL APPLY 2 4 GRAMS 3 TO 4 TIMES DAILY     fish oil-omega-3 fatty  acids 1000 MG capsule Take 2 g by mouth daily.       fluticasone furoate-vilanterol (BREO ELLIPTA) 200-25 MCG/INH AEPB TAKE 1 PUFF BY MOUTH EVERY DAY 60 each 5   fluvastatin (LESCOL) 20 MG capsule Take 20 mg by mouth daily.     fluvastatin (LESCOL) 20 MG capsule 1 capsule     gabapentin (NEURONTIN) 100 MG capsule Take 100-300 mg by mouth at bedtime as needed (pain or insomnia).     gabapentin (NEURONTIN) 100 MG capsule 1 capsule     Glucosamine-Chondroit-Vit C-Mn (GLUCOSAMINE CHONDR 1500 COMPLX PO) See admin instructions.     glucosamine-chondroitin 500-400 MG tablet Take 1 tablet by mouth daily.       Magnesium 300 MG CAPS See admin instructions.     meloxicam (MOBIC) 15 MG tablet Take 7.5 mg by mouth daily.  2   methocarbamol (ROBAXIN) 750 MG tablet Take 750 mg by mouth every 8 (eight) hours as needed.     Misc Natural Products (OSTEO BI-FLEX JOINT SHIELD PO) See admin instructions.     Omega-3 Fatty  Acids (FISH OIL) 1000 MG CAPS See admin instructions.     ondansetron (ZOFRAN) 4 MG tablet Take 1 tablet (4 mg total) by mouth every 8 (eight) hours as needed for nausea or vomiting. 20 tablet 0   pantoprazole (PROTONIX) 40 MG tablet Take 1 tablet (40 mg total) by mouth daily. 7 tablet 0   Potassium 75 MG TABS See admin instructions.     PRALUENT 150 MG/ML SOAJ      Specialty Vitamins Products (MAGNESIUM, AMINO ACID CHELATE,) 133 MG tablet Take 1 tablet by mouth daily.       sulfamethoxazole-trimethoprim (BACTRIM DS) 800-160 MG tablet Take 1 tablet by mouth 2 (two) times daily. 28 tablet 0   SUPER B COMPLEX/C PO See admin instructions.     traMADol (ULTRAM) 50 MG tablet TAKE 1 TO 2 TABLETS BY MOUTH 2 TO 3 TIMES DAILY AS NEEDED     vitamin B-12 (CYANOCOBALAMIN) 100 MCG tablet 1 tablet     VITAMIN D, CHOLECALCIFEROL, PO Take 1 tablet by mouth daily.      No current facility-administered medications for this visit.    Family History  Problem Relation Age of Onset   Heart disease Mother    Stroke Father    Diabetes Maternal Grandmother    Heart disease Maternal Grandmother    Diabetes Maternal Grandfather    Cancer Maternal Grandfather        Colon   Breast cancer Paternal Grandmother        Age 25    Review of Systems  Genitourinary:  Positive for frequency. Negative for vaginal pain.  All other systems reviewed and are negative.  Exam:   BP 132/84   Pulse 70   Ht 5\' 2"  (1.575 m)   Wt 127 lb (57.6 kg)   SpO2 99%   BMI 23.23 kg/m   Weight change: @WEIGHTCHANGE @ Height:   Height: 5\' 2"  (157.5 cm)  Ht Readings from Last 3 Encounters:  10/18/20 5\' 2"  (1.575 m)  10/03/20 5\' 2"  (1.575 m)  08/27/19 5\' 1"  (1.549 m)    General appearance: alert, cooperative and appears stated age Head: Normocephalic, without obvious abnormality, atraumatic Neck: no adenopathy, supple, symmetrical, trachea midline and thyroid normal to inspection and palpation Breasts: normal appearance, no masses  or tenderness Abdomen: soft, non-tender; non distended,  no masses,  no organomegaly Extremities: extremities normal, atraumatic, no cyanosis or edema Skin:  Skin color, texture, turgor normal. No rashes or lesions Lymph nodes: Cervical, supraclavicular, and axillary nodes normal. No abnormal inguinal nodes palpated Neurologic: Grossly normal   Pelvic: External genitalia:  no lesions              Urethra:  normal appearing urethra with no masses, tenderness or lesions              Bartholins and Skenes: normal                 Vagina: normal appearing vagina with normal color and discharge, no lesions              Cervix: absent               Bimanual Exam:  Uterus:  uterus absent              Adnexa: no mass, fullness, tenderness               Rectovaginal: Confirms               Anus:  normal sphincter tone, no lesions  Gae Dry chaperoned for the exam.  1. Encounter for gynecological examination without abnormal finding Discussed breast self exam Discussed calcium and vit D intake Mammogram Colonoscopy UTD  2. Osteopenia, unspecified location DEXA ordered  3. Leg cramps Given information Discussed stretching  4. Urinary frequency - Urinalysis,Complete w/RFL Culture: negative -Declines mendication

## 2020-10-18 ENCOUNTER — Other Ambulatory Visit: Payer: Self-pay

## 2020-10-18 ENCOUNTER — Encounter: Payer: Self-pay | Admitting: Obstetrics and Gynecology

## 2020-10-18 ENCOUNTER — Ambulatory Visit (INDEPENDENT_AMBULATORY_CARE_PROVIDER_SITE_OTHER): Payer: Medicare Other | Admitting: Obstetrics and Gynecology

## 2020-10-18 ENCOUNTER — Telehealth: Payer: Self-pay | Admitting: Pulmonary Disease

## 2020-10-18 VITALS — BP 132/84 | HR 70 | Ht 62.0 in | Wt 127.0 lb

## 2020-10-18 DIAGNOSIS — R252 Cramp and spasm: Secondary | ICD-10-CM | POA: Diagnosis not present

## 2020-10-18 DIAGNOSIS — Z01419 Encounter for gynecological examination (general) (routine) without abnormal findings: Secondary | ICD-10-CM

## 2020-10-18 DIAGNOSIS — R350 Frequency of micturition: Secondary | ICD-10-CM | POA: Diagnosis not present

## 2020-10-18 DIAGNOSIS — M8580 Other specified disorders of bone density and structure, unspecified site: Secondary | ICD-10-CM | POA: Diagnosis not present

## 2020-10-18 LAB — URINALYSIS, COMPLETE W/RFL CULTURE
Bacteria, UA: NONE SEEN /HPF
Bilirubin Urine: NEGATIVE
Glucose, UA: NEGATIVE
Hgb urine dipstick: NEGATIVE
Hyaline Cast: NONE SEEN /LPF
Ketones, ur: NEGATIVE
Leukocyte Esterase: NEGATIVE
Nitrites, Initial: NEGATIVE
Protein, ur: NEGATIVE
RBC / HPF: NONE SEEN /HPF (ref 0–2)
Specific Gravity, Urine: 1.02 (ref 1.001–1.035)
WBC, UA: NONE SEEN /HPF (ref 0–5)
pH: 6 (ref 5.0–8.0)

## 2020-10-18 NOTE — Telephone Encounter (Signed)
I have called the pt and she is going to reach out to the insurance company to find out what might be cheaper for her.

## 2020-10-18 NOTE — Patient Instructions (Addendum)
EXERCISE   We recommended that you start or continue a regular exercise program for good health. Physical activity is anything that gets your body moving, some is better than none. The CDC recommends 150 minutes per week of Moderate-Intensity Aerobic Activity and 2 or more days of Muscle Strengthening Activity.  Benefits of exercise are limitless: helps weight loss/weight maintenance, improves mood and energy, helps with depression and anxiety, improves sleep, tones and strengthens muscles, improves balance, improves bone density, protects from chronic conditions such as heart disease, high blood pressure and diabetes and so much more. To learn more visit: https://www.cdc.gov/physicalactivity/index.html  DIET: Good nutrition starts with a healthy diet of fruits, vegetables, whole grains, and lean protein sources. Drink plenty of water for hydration. Minimize empty calories, sodium, sweets. For more information about dietary recommendations visit: https://health.gov/our-work/nutrition-physical-activity/dietary-guidelines and https://www.myplate.gov/  ALCOHOL:  Women should limit their alcohol intake to no more than 7 drinks/beers/glasses of wine (combined, not each!) per week. Moderation of alcohol intake to this level decreases your risk of breast cancer and liver damage.  If you are concerned that you may have a problem, or your friends have told you they are concerned about your drinking, there are many resources to help. A well-known program that is free, effective, and available to all people all over the nation is Alcoholics Anonymous.  Check out this site to learn more: https://www.aa.org/   CALCIUM AND VITAMIN D:  Adequate intake of calcium and Vitamin D are recommended for bone health.  You should be getting between 1000-1200 mg of calcium and 800 units of Vitamin D daily between diet and supplements  PAP SMEARS:  Pap smears, to check for cervical cancer or precancers,  have traditionally been  done yearly, scientific advances have shown that most women can have pap smears less often.  However, every woman still should have a physical exam from her gynecologist every year. It will include a breast check, inspection of the vulva and vagina to check for abnormal growths or skin changes, a visual exam of the cervix, and then an exam to evaluate the size and shape of the uterus and ovaries. We will also provide age appropriate advice regarding health maintenance, like when you should have certain vaccines, screening for sexually transmitted diseases, bone density testing, colonoscopy, mammograms, etc.   MAMMOGRAMS:  All women over 40 years old should have a routine mammogram.   COLON CANCER SCREENING: Now recommend starting at age 45. At this time colonoscopy is not covered for routine screening until 50. There are take home tests that can be done between 45-49.   COLONOSCOPY:  Colonoscopy to screen for colon cancer is recommended for all women at age 50.  We know, you hate the idea of the prep.  We agree, BUT, having colon cancer and not knowing it is worse!!  Colon cancer so often starts as a polyp that can be seen and removed at colonscopy, which can quite literally save your life!  And if your first colonoscopy is normal and you have no family history of colon cancer, most women don't have to have it again for 10 years.  Once every ten years, you can do something that may end up saving your life, right?  We will be happy to help you get it scheduled when you are ready.  Be sure to check your insurance coverage so you understand how much it will cost.  It may be covered as a preventative service at no cost, but you should check   your particular policy.      Breast Self-Awareness Breast self-awareness means being familiar with how your breasts look and feel. It involves checking your breasts regularly and reporting any changes to your health care provider. Practicing breast self-awareness is  important. A change in your breasts can be a sign of a serious medical problem. Being familiar with how your breasts look and feel allows you to find any problems early, when treatment is more likely to be successful. All women should practice breast self-awareness, including women who have had breast implants. How to do a breast self-exam One way to learn what is normal for your breasts and whether your breasts are changing is to do a breast self-exam. To do a breast self-exam: Look for Changes  Remove all the clothing above your waist. Stand in front of a mirror in a room with good lighting. Put your hands on your hips. Push your hands firmly downward. Compare your breasts in the mirror. Look for differences between them (asymmetry), such as: Differences in shape. Differences in size. Puckers, dips, and bumps in one breast and not the other. Look at each breast for changes in your skin, such as: Redness. Scaly areas. Look for changes in your nipples, such as: Discharge. Bleeding. Dimpling. Redness. A change in position. Feel for Changes Carefully feel your breasts for lumps and changes. It is best to do this while lying on your back on the floor and again while sitting or standing in the shower or tub with soapy water on your skin. Feel each breast in the following way: Place the arm on the side of the breast you are examining above your head. Feel your breast with the other hand. Start in the nipple area and make  inch (2 cm) overlapping circles to feel your breast. Use the pads of your three middle fingers to do this. Apply light pressure, then medium pressure, then firm pressure. The light pressure will allow you to feel the tissue closest to the skin. The medium pressure will allow you to feel the tissue that is a little deeper. The firm pressure will allow you to feel the tissue close to the ribs. Continue the overlapping circles, moving downward over the breast until you feel your  ribs below your breast. Move one finger-width toward the center of the body. Continue to use the  inch (2 cm) overlapping circles to feel your breast as you move slowly up toward your collarbone. Continue the up and down exam using all three pressures until you reach your armpit.  Write Down What You Find  Write down what is normal for each breast and any changes that you find. Keep a written record with breast changes or normal findings for each breast. By writing this information down, you do not need to depend only on memory for size, tenderness, or location. Write down where you are in your menstrual cycle, if you are still menstruating. If you are having trouble noticing differences in your breasts, do not get discouraged. With time you will become more familiar with the variations in your breasts and more comfortable with the exam. How often should I examine my breasts? Examine your breasts every month. If you are breastfeeding, the best time to examine your breasts is after a feeding or after using a breast pump. If you menstruate, the best time to examine your breasts is 5-7 days after your period is over. During your period, your breasts are lumpier, and it may be more   difficult to notice changes. When should I see my health care provider? See your health care provider if you notice: A change in shape or size of your breasts or nipples. A change in the skin of your breast or nipples, such as a reddened or scaly area. Unusual discharge from your nipples. A lump or thick area that was not there before. Pain in your breasts. Anything that concerns you.  Leg Cramps Leg cramps occur when one or more muscles tighten and a person has no control over it (involuntary muscle contraction). Muscle cramps are most common in the calf muscles of the leg. They can occur during exercise or at rest. Leg cramps are painful, and they may last for a few seconds to a few minutes. Cramps may return several  times before they finally stop. Usually, leg cramps are not caused by a serious medical problem. In many cases, the cause is not known. Some common causes include: Excessive physical effort (overexertion), such as during intense exercise. Doing the same motion over and over. Staying in a certain position for a long period of time. Improper preparation, form, or technique while doing a sport or an activity. Dehydration. Injury. Side effects of certain medicines. Abnormally low levels of minerals in your blood (electrolytes), especially potassium and calcium. This could result from: Pregnancy. Taking diuretic medicines. Follow these instructions at home: Eating and drinking Drink enough fluid to keep your urine pale yellow. Staying hydrated may help prevent cramps. Eat a healthy diet that includes plenty of nutrients to help your muscles function. A healthy diet includes fruits and vegetables, lean protein, whole grains, and low-fat or nonfat dairy products. Managing pain, stiffness, and swelling   Try massaging, stretching, and relaxing the affected muscle. Do this for several minutes at a time. If directed, put ice on areas that are sore or painful after a cramp. To do this: Put ice in a plastic bag. Place a towel between your skin and the bag. Leave the ice on for 20 minutes, 2-3 times a day. Remove the ice if your skin turns bright red. This is very important. If you cannot feel pain, heat, or cold, you have a greater risk of damage to the area. If directed, apply heat to muscles that are tense or tight. Do this before you exercise, or as often as told by your health care provider. Use the heat source that your health care provider recommends, such as a moist heat pack or a heating pad. To do this: Place a towel between your skin and the heat source. Leave the heat on for 20-30 minutes. Remove the heat if your skin turns bright red. This is especially important if you are unable to feel  pain, heat, or cold. You may have a greater risk of getting burned. Try taking hot showers or baths to help relax tight muscles. General instructions If you are having frequent leg cramps, avoid intense exercise for several days. Take over-the-counter and prescription medicines only as told by your health care provider. Keep all follow-up visits. This is important. Contact a health care provider if: Your leg cramps get more severe or more frequent, or they do not improve over time. Your foot becomes cold, numb, or blue. Summary Muscle cramps can develop in any muscle, but the most common place is in the calf muscles of the leg. Leg cramps are painful, and they may last for a few seconds to a few minutes. Usually, leg cramps are not caused by a  serious medical problem. Often, the cause is not known. Stay hydrated, and take over-the-counter and prescription medicines only as told by your health care provider. This information is not intended to replace advice given to you by your health care provider. Make sure you discuss any questions you have with your health care provider. Document Revised: 06/02/2019 Document Reviewed: 06/02/2019 Elsevier Patient Education  [employer].

## 2020-10-18 NOTE — Telephone Encounter (Signed)
Pt states the pharmacy said insurance has increased price of Breo. Pt wanting to know if we have any coupons or anything to help with the cost- was around $50-60 and that was okay and now is $120. Please advise [phone] Please call before 11 or after 12:30- has dr appt

## 2020-10-23 ENCOUNTER — Other Ambulatory Visit: Payer: Self-pay

## 2020-10-23 ENCOUNTER — Encounter: Payer: Self-pay | Admitting: Podiatry

## 2020-10-23 ENCOUNTER — Ambulatory Visit: Payer: Medicare Other | Admitting: Podiatry

## 2020-10-23 DIAGNOSIS — L84 Corns and callosities: Secondary | ICD-10-CM | POA: Diagnosis not present

## 2020-10-23 DIAGNOSIS — M79674 Pain in right toe(s): Secondary | ICD-10-CM

## 2020-10-23 DIAGNOSIS — M79672 Pain in left foot: Secondary | ICD-10-CM

## 2020-10-23 DIAGNOSIS — B351 Tinea unguium: Secondary | ICD-10-CM | POA: Diagnosis not present

## 2020-10-23 DIAGNOSIS — M79671 Pain in right foot: Secondary | ICD-10-CM

## 2020-10-23 DIAGNOSIS — M79675 Pain in left toe(s): Secondary | ICD-10-CM

## 2020-10-26 ENCOUNTER — Telehealth: Payer: Self-pay

## 2020-10-26 DIAGNOSIS — N3000 Acute cystitis without hematuria: Secondary | ICD-10-CM | POA: Diagnosis not present

## 2020-10-26 DIAGNOSIS — R300 Dysuria: Secondary | ICD-10-CM | POA: Diagnosis not present

## 2020-10-26 DIAGNOSIS — Z23 Encounter for immunization: Secondary | ICD-10-CM | POA: Diagnosis not present

## 2020-10-26 NOTE — Telephone Encounter (Signed)
Patient in voice mail complaining of a "terrible UTI". She is have burning with urination, frequency with little output. She said she is miserable. Worst UTI she has had.  No antibiotic allergies All:  Morphine        Naproxen Sodium

## 2020-10-26 NOTE — Telephone Encounter (Signed)
I called and spoke with patient. She is going to check with her PCP and see if they can see her today for this. She will call me back either way and let me know her plan.

## 2020-10-26 NOTE — Telephone Encounter (Signed)
Patient called me back. PCP has schedule appointment for her this afternoon. She was very appreciative for call back and assistance.

## 2020-10-27 NOTE — Progress Notes (Signed)
Subjective: Diana Cisneros is a pleasant 75 y.o. female patient seen today for corns, calluses and painful thick toenails that are difficult to trim. Pain interferes with ambulation. Aggravating factors include wearing enclosed shoe gear. Pain is relieved with periodic professional debridement.  PCP is Seward Dama, MD. Last visit was: 06/16/2020.  She voices no new pedal problems on today's visit.   Allergies  Allergen Reactions   Morphine And Related Hives   Morphine Sulfate     Other reaction(s): itching/redface   Other     Other reaction(s): rash/lips swelling   Naproxen Sodium Rash    Objective: Physical Exam  General: Diana Cisneros is a pleasant 75 y.o. Caucasian female, WD, WN in NAD. AAO x 3.   Vascular:  Capillary refill time to digits immediate b/l. Palpable DP pulse(s) b/l lower extremities Palpable PT pulse(s) b/l lower extremities Pedal hair present. Lower extremity skin temperature gradient within normal limits. No pain with calf compression b/l. No edema noted b/l lower extremities.  Dermatological:  Toenails L 2nd toe and R 2nd toe severely elongated, thickened with discoloration, also possessing excessive curvature and impinging onto pedal the distal tips of the digits. No erythema, no edema, no drainage, no fluctuance. Hyperkeratotic lesion(s) L 2nd toe, L 5th toe, R 2nd toe, R 5th toe, and submet head 5 right foot.  No erythema, no edema, no drainage, no fluctuance.  Musculoskeletal:  Normal muscle strength 5/5 to all lower extremity muscle groups bilaterally. No pain crepitus or joint limitation noted with ROM b/l. Hallux valgus with bunion deformity noted b/l lower extremities. Hammertoe(s) noted to the L 5th toe and R 5th toe.  Neurological:  Protective sensation intact 5/5 intact bilaterally with 10g monofilament b/l. Vibratory sensation intact b/l.  Assessment and Plan:  1. Pain due to onychomycosis of toenails of both feet   2. Corns and callosities   3.  Pain in both feet    -Examined patient. -Medicare ABN signed for this year. Patient consents for services of paring of corns and callluses (>4)  today. Copy has been placed in patient's chart. -Patient to continue soft, supportive shoe gear daily. -Toenails L 2nd toe and R 2nd toe debrided in length and girth without iatrogenic bleeding with sterile nail nipper and dremel.  -Corn(s) L 2nd toe, L 5th toe, R 2nd toe, and R 5th toe and callus(es) submet head 5 right foot were pared utilizing sterile scalpel blade without incident. Total number debrided =5. -Patient to report any pedal injuries to medical professional immediately. -Patient/POA to call should there be question/concern in the interim.  Return in about 3 months (around 01/22/2021).  Marzetta Board, DPM

## 2020-11-01 DIAGNOSIS — M8588 Other specified disorders of bone density and structure, other site: Secondary | ICD-10-CM | POA: Diagnosis not present

## 2020-11-01 DIAGNOSIS — E7800 Pure hypercholesterolemia, unspecified: Secondary | ICD-10-CM | POA: Diagnosis not present

## 2020-11-01 DIAGNOSIS — Z0000 Encounter for general adult medical examination without abnormal findings: Secondary | ICD-10-CM | POA: Diagnosis not present

## 2020-11-01 DIAGNOSIS — Z5181 Encounter for therapeutic drug level monitoring: Secondary | ICD-10-CM | POA: Diagnosis not present

## 2020-11-01 DIAGNOSIS — I700 Atherosclerosis of aorta: Secondary | ICD-10-CM | POA: Diagnosis not present

## 2020-11-08 DIAGNOSIS — L738 Other specified follicular disorders: Secondary | ICD-10-CM | POA: Diagnosis not present

## 2020-11-08 DIAGNOSIS — L218 Other seborrheic dermatitis: Secondary | ICD-10-CM | POA: Diagnosis not present

## 2020-11-08 DIAGNOSIS — L309 Dermatitis, unspecified: Secondary | ICD-10-CM | POA: Diagnosis not present

## 2020-11-08 DIAGNOSIS — Z85828 Personal history of other malignant neoplasm of skin: Secondary | ICD-10-CM | POA: Diagnosis not present

## 2020-11-22 DIAGNOSIS — H2512 Age-related nuclear cataract, left eye: Secondary | ICD-10-CM | POA: Diagnosis not present

## 2020-11-28 ENCOUNTER — Ambulatory Visit (INDEPENDENT_AMBULATORY_CARE_PROVIDER_SITE_OTHER): Payer: Medicare Other

## 2020-11-28 ENCOUNTER — Other Ambulatory Visit: Payer: Self-pay

## 2020-11-28 ENCOUNTER — Other Ambulatory Visit: Payer: Self-pay | Admitting: Obstetrics and Gynecology

## 2020-11-28 DIAGNOSIS — M8580 Other specified disorders of bone density and structure, unspecified site: Secondary | ICD-10-CM

## 2020-11-28 DIAGNOSIS — M8589 Other specified disorders of bone density and structure, multiple sites: Secondary | ICD-10-CM

## 2020-11-28 DIAGNOSIS — Z780 Asymptomatic menopausal state: Secondary | ICD-10-CM | POA: Diagnosis not present

## 2020-12-08 ENCOUNTER — Ambulatory Visit: Payer: Medicare Other | Admitting: Podiatry

## 2020-12-25 ENCOUNTER — Ambulatory Visit: Payer: Medicare Other | Admitting: Obstetrics and Gynecology

## 2020-12-25 ENCOUNTER — Other Ambulatory Visit: Payer: Self-pay

## 2020-12-25 VITALS — BP 130/80 | HR 76 | Ht 62.0 in | Wt 128.0 lb

## 2020-12-25 DIAGNOSIS — M810 Age-related osteoporosis without current pathological fracture: Secondary | ICD-10-CM

## 2020-12-25 NOTE — Progress Notes (Signed)
GYNECOLOGY  VISIT   HPI: 75 y.o.   Married White or Caucasian Not Hispanic or Latino  female   G0P0 with No LMP recorded. Patient has had a hysterectomy.   here to discuss Dexa results.   Recent Dexa with Tscore of -2.4, Frax 20/11%.  She gets calcium and vit d in her diet and supplements.  She exercises regularly, walking daily (over 14,000 steps a day).  She works in [employer].  +FH of hip fracture in her mother.   GYNECOLOGIC HISTORY: No LMP recorded. Patient has had a hysterectomy. Contraception:pmp Menopausal hormone therapy: none         OB History     Gravida  0   Para      Term      Preterm      AB      Living         SAB      IAB      Ectopic      Multiple      Live Births                 Patient Active Problem List   Diagnosis Date Noted   Acute sinusitis 07/19/2020   Allergic rhinitis 07/19/2020   Anxiety state 07/19/2020   Hardening of the aorta (main artery of the heart) ([place]) 07/19/2020   Pure hypercholesterolemia 07/19/2020   Upper respiratory infection 07/19/2020   Hyperlipidemia 04/26/2019   Statin myopathy 04/26/2019   Nail dystrophy 12/30/2018   Moderate persistent asthma without complication 62/83/6629   Degenerative disc disease     Past Medical History:  Diagnosis Date   Allergic rhinitis    Anxiety    Anxiousness    Degenerative disc disease    thoracic,lumbar   Hypercholesterolemia    Hyperlipidemia    Low back pain    Moderate persistent asthma without complication 4/76/5465   Muscle cramps    Nail dystrophy 12/30/2018   Osteopenia 10/2018   T score -2.2 distal third of forearm.  -1.6 left femoral neck FRAX 18% / 6.7%   Seasonal allergies    Sinusitis, acute    URI (upper respiratory infection)     Past Surgical History:  Procedure Laterality Date   ABDOMINAL HYSTERECTOMY  1986   TAH  LSO   APPENDECTOMY  2009   BREAST EXCISIONAL BIOPSY Left 1995   BREAST SURGERY     Biopsy benign   COLONOSCOPY  WITH PROPOFOL N/A 12/27/2014   Procedure: COLONOSCOPY WITH PROPOFOL;  Surgeon: Garlan Fair, MD;  Location: [facility];  Service: Endoscopy;  Laterality: N/A;   KNEE SURGERY     arthroscopic   OOPHORECTOMY     LSO 86,RSO 95   PELVIC LAPAROSCOPY  1995   RSO    Current Outpatient Medications  Medication Sig Dispense Refill   Ascorbic Acid (VITAMIN C) 100 MG tablet Take 100 mg by mouth daily.       Ascorbic Acid (VITAMIN C) 500 MG CHEW See admin instructions.     augmented betamethasone dipropionate (DIPROLENE-AF) 0.05 % cream APPLY TO AFFECTED AREA AS NEEDED 50 g 0   Calcium Carbonate+Vitamin D 600-200 MG-UNIT TABS 1 tablet     Calcium Carbonate-Vitamin D (CALCIUM + D PO) Take 1 tablet by mouth daily.      chlorpheniramine (CHLOR-TRIMETON) 4 MG tablet Take 4 mg by mouth 2 (two) times daily as needed for allergies.     cholecalciferol (VITAMIN D3) 25 MCG (1000 UNIT) tablet  1 tablet     diazepam (VALIUM) 5 MG tablet Take 5 mg by mouth daily as needed.  3   diclofenac sodium (VOLTAREN) 1 % GEL APPLY 2 4 GRAMS 3 TO 4 TIMES DAILY     fish oil-omega-3 fatty acids 1000 MG capsule Take 2 g by mouth daily.       fluticasone furoate-vilanterol (BREO ELLIPTA) 200-25 MCG/INH AEPB TAKE 1 PUFF BY MOUTH EVERY DAY 60 each 5   fluvastatin (LESCOL) 20 MG capsule Take 20 mg by mouth daily.     fluvastatin (LESCOL) 20 MG capsule 1 capsule     gabapentin (NEURONTIN) 100 MG capsule Take 100-300 mg by mouth at bedtime as needed (pain or insomnia).     gabapentin (NEURONTIN) 100 MG capsule 1 capsule     Glucosamine-Chondroit-Vit C-Mn (GLUCOSAMINE CHONDR 1500 COMPLX PO) See admin instructions.     glucosamine-chondroitin 500-400 MG tablet Take 1 tablet by mouth daily.       Magnesium 300 MG CAPS See admin instructions.     meloxicam (MOBIC) 15 MG tablet Take 7.5 mg by mouth daily.  2   methocarbamol (ROBAXIN) 750 MG tablet Take 750 mg by mouth every 8 (eight) hours as needed.     Misc Natural Products  (OSTEO BI-FLEX JOINT SHIELD PO) See admin instructions.     Omega-3 Fatty Acids (FISH OIL) 1000 MG CAPS See admin instructions.     ondansetron (ZOFRAN) 4 MG tablet Take 1 tablet (4 mg total) by mouth every 8 (eight) hours as needed for nausea or vomiting. 20 tablet 0   pantoprazole (PROTONIX) 40 MG tablet Take 1 tablet (40 mg total) by mouth daily. 7 tablet 0   Potassium 75 MG TABS See admin instructions.     PRALUENT 150 MG/ML SOAJ      Specialty Vitamins Products (MAGNESIUM, AMINO ACID CHELATE,) 133 MG tablet Take 1 tablet by mouth daily.       sulfamethoxazole-trimethoprim (BACTRIM DS) 800-160 MG tablet Take 1 tablet by mouth 2 (two) times daily. 28 tablet 0   SUPER B COMPLEX/C PO See admin instructions.     traMADol (ULTRAM) 50 MG tablet TAKE 1 TO 2 TABLETS BY MOUTH 2 TO 3 TIMES DAILY AS NEEDED     vitamin B-12 (CYANOCOBALAMIN) 100 MCG tablet 1 tablet     VITAMIN D, CHOLECALCIFEROL, PO Take 1 tablet by mouth daily.      No current facility-administered medications for this visit.     ALLERGIES: Morphine and related, Morphine sulfate, Other, and Naproxen sodium  Family History  Problem Relation Age of Onset   Heart disease Mother    Stroke Father    Diabetes Maternal Grandmother    Heart disease Maternal Grandmother    Diabetes Maternal Grandfather    Cancer Maternal Grandfather        Colon   Breast cancer Paternal 12        Age 57  Mother with h/o osteoporosis and a hip fracture.   Social History   Socioeconomic History   Marital status: Married    Spouse name: Not on file   Number of children: Not on file   Years of education: Not on file   Highest education level: Not on file  Occupational History   Not on file  Tobacco Use   Smoking status: Former    Packs/day: 0.00    Types: Cigarettes    Quit date: 9    Years since quitting: 57.9   Smokeless tobacco:  Never   Tobacco comments:    one pack of cigarettes would last two weeks-10/29/17  Vaping Use    Vaping Use: Never used  Substance and Sexual Activity   Alcohol use: Yes    Alcohol/week: 2.0 standard drinks    Types: 2 Standard drinks or equivalent per week    Comment: occ   Drug use: No   Sexual activity: Not Currently    Birth control/protection: Post-menopausal, Surgical    Comment: 1st intercourse 75 yo-More than 5 partners  Other Topics Concern   Not on file  Social History Narrative   Not on file   Social Determinants of Health   Financial Resource Strain: Not on file  Food Insecurity: Not on file  Transportation Needs: Not on file  Physical Activity: Not on file  Stress: Not on file  Social Connections: Not on file  Intimate Partner Violence: Not on file    Review of Systems  All other systems reviewed and are negative.  PHYSICAL EXAMINATION:    BP 130/80   Pulse 76   Wt 128 lb (58.1 kg)   SpO2 100%   BMI 23.41 kg/m     General appearance: alert, cooperative and appears stated age  59. Age-related osteoporosis without current pathological fracture Will get a copy of her labs from her primary May need additional lab work. Discussed adequate calcium and vit d intake Discussed working on balance and be careful to avoid falling Discussed making her house as fall proof as possible Discussed medications, recommended fosamax. No contraindications, risks reviewed. Also discussed prolia as a second line medication She will consider.   Addendum: labs reviewed from 11/01/20 Normal CBC, CMP, TSH Vit D 46.5 Cholesterol 240, trig 69, HDL 67, LDL 161, Non-HDL 173, ration 3.6 Will have her return for Magnesium and phosphorous level Will start Fosamax F/U DEXA in 2 years.

## 2020-12-25 NOTE — Patient Instructions (Signed)

## 2020-12-28 ENCOUNTER — Telehealth: Payer: Self-pay | Admitting: *Deleted

## 2020-12-28 NOTE — Telephone Encounter (Signed)
Patient was seen on 12/25/20 to discuss bone medication. Patient said she has thought about fosamax and would like to proceed with Rx.

## 2020-12-29 ENCOUNTER — Encounter: Payer: Self-pay | Admitting: Obstetrics and Gynecology

## 2020-12-29 MED ORDER — ALENDRONATE SODIUM 70 MG PO TABS
70.0000 mg | ORAL_TABLET | ORAL | 3 refills | Status: DC
Start: 2020-12-29 — End: 2021-12-10

## 2020-12-29 NOTE — Telephone Encounter (Signed)
Patient scheduled on 01/01/21 for labs. Patient also aware of the below.

## 2020-12-29 NOTE — Telephone Encounter (Signed)
Please let the patient know that I have reviewed her labs from her primary. I've ordered a few more labs. Please set up an appointment for a lab visit (not fasting) and let her know that I have called in the fosamax for her to start. Diana Cisneros

## 2021-01-01 ENCOUNTER — Other Ambulatory Visit: Payer: Medicare Other

## 2021-01-01 ENCOUNTER — Other Ambulatory Visit: Payer: Self-pay

## 2021-01-01 DIAGNOSIS — M810 Age-related osteoporosis without current pathological fracture: Secondary | ICD-10-CM | POA: Diagnosis not present

## 2021-01-01 LAB — PHOSPHORUS: Phosphorus: 3.9 mg/dL (ref 2.1–4.3)

## 2021-01-01 LAB — MAGNESIUM: Magnesium: 2.1 mg/dL (ref 1.5–2.5)

## 2021-01-15 ENCOUNTER — Telehealth: Payer: Self-pay

## 2021-01-15 NOTE — Telephone Encounter (Signed)
Pt called about most recent lab results and if it was ok to start fosamax. Pt notified that [place] notified her via [place] and to start taking meds. Pt voiced understanding.

## 2021-01-18 ENCOUNTER — Telehealth: Payer: Self-pay | Admitting: *Deleted

## 2021-01-18 NOTE — Telephone Encounter (Signed)
Patient called was given the okay to start on Fosamax. She wanted to make sure you are aware she takes meloxicam she read that she was to notify her provider if she is taking any nonsteroidal anti-inflammatory. Please advise

## 2021-01-19 NOTE — Telephone Encounter (Signed)
Spoke with patient and informed her with all the below. She spoke with pharmacist yesterday and was told same regarding GI irritation. Patient said she does not feel comfortable taking fosamax along with meloxicam due to the fear of having GI problems. She asked if another medication would be an option to take along with meloxicam? Please advise

## 2021-01-19 NOTE — Telephone Encounter (Signed)
It isn't contraindicated to use a NSAID and Fosamax together. The NSAID can potentially increase the risk of GI irritation. It isn't recommended if someone has significant renal disease and she doesn't. If she has any side effects she should reach out.

## 2021-01-30 NOTE — Progress Notes (Signed)
CARDIOLOGY CONSULT NOTE       Patient ID: Diana Cisneros MRN: [id number] DOB/AGE: 05-13-1945 76 y.o.  Admit date: (Not on file) Referring Physician: [place] Primary Physician: Seward Shaleen, MD Primary Cardiologist: New Reason for Consultation: Chest Pain  Active Problems:   * No active hospital problems. *   HPI:  76 y.o. with bronchospastic lung disease seen by [facility], family history of CAD, severe unRx HLD. Intolerant to statins due to leg cramps on multiple ones. She is a friend of my next door neighbor and does [employer]. First seen 04/20/19 noted more dyspnea/fatigue and chest pain Seen by [facility] in pulmonary with inhaler RX   Cardiac CTA done 05/31/19 was normal with calcium score 0 Non contrast lung CT shows tree in bud bronchiectasis ? MAI PFTls showed significant bronchodilator response prescribed Breo  HLD with myalgias on limited statin exposure LDL 161 11/01/20 was on Prealuent and now Lescol  She indicated trying to give herself a shot and it didn't work. She wants to recheck Labs and I told her I would set her up to see lipid clinic again to learn how to inject better She is still somewhat hesitant to take shots    ROS All other systems reviewed and negative except as noted above  Past Medical History:  Diagnosis Date   Allergic rhinitis    Anxiety    Anxiousness    Degenerative disc disease    thoracic,lumbar   Hypercholesterolemia    Hyperlipidemia    Low back pain    Moderate persistent asthma without complication 07/31/5007   Muscle cramps    Nail dystrophy 12/30/2018   Osteopenia 10/2018   T score -2.2 distal third of forearm.  -1.6 left femoral neck FRAX 18% / 6.7%   Seasonal allergies    Sinusitis, acute    URI (upper respiratory infection)     Family History  Problem Relation Age of Onset   Heart disease Mother    Stroke Father    Diabetes Maternal Grandmother    Heart disease Maternal Grandmother    Diabetes Maternal  Grandfather    Cancer Maternal Grandfather        Colon   Breast cancer Paternal Grandmother        Age 76    Social History   Socioeconomic History   Marital status: Married    Spouse name: Not on file   Number of children: Not on file   Years of education: Not on file   Highest education level: Not on file  Occupational History   Not on file  Tobacco Use   Smoking status: Former    Packs/day: 0.00    Types: Cigarettes    Quit date: 1965    Years since quitting: 58.0   Smokeless tobacco: Never   Tobacco comments:    one pack of cigarettes would last two weeks-10/29/17  Vaping Use   Vaping Use: Never used  Substance and Sexual Activity   Alcohol use: Yes    Alcohol/week: 2.0 standard drinks    Types: 2 Standard drinks or equivalent per week    Comment: occ   Drug use: No   Sexual activity: Not Currently    Birth control/protection: Post-menopausal, Surgical    Comment: 1st intercourse 76 yo-More than 5 partners  Other Topics Concern   Not on file  Social History Narrative   Not on file   Social Determinants of Health   Financial Resource Strain: Not on file  Food Insecurity: Not on file  Transportation Needs: Not on file  Physical Activity: Not on file  Stress: Not on file  Social Connections: Not on file  Intimate Partner Violence: Not on file    Past Surgical History:  Procedure Laterality Date   ABDOMINAL HYSTERECTOMY  1986   TAH  LSO   APPENDECTOMY  2009   BREAST EXCISIONAL BIOPSY Left 1995   BREAST SURGERY     Biopsy benign   COLONOSCOPY WITH PROPOFOL N/A 12/27/2014   Procedure: COLONOSCOPY WITH PROPOFOL;  Surgeon: Garlan Fair, MD;  Location: [facility];  Service: Endoscopy;  Laterality: N/A;   KNEE SURGERY     arthroscopic   OOPHORECTOMY     LSO 86,RSO 95   PELVIC LAPAROSCOPY  1995   RSO      Current Outpatient Medications:    alendronate (FOSAMAX) 70 MG tablet, Take 1 tablet (70 mg total) by mouth every 7 (seven) days. Take first  thing in am with 6 oz. Water.  Be upright after taking.  Eat nothing for one hour., Disp: 12 tablet, Rfl: 3   Ascorbic Acid (VITAMIN C) 100 MG tablet, Take 100 mg by mouth daily.  , Disp: , Rfl:    augmented betamethasone dipropionate (DIPROLENE-AF) 0.05 % cream, APPLY TO AFFECTED AREA AS NEEDED, Disp: 50 g, Rfl: 0   Calcium Carbonate+Vitamin D 600-200 MG-UNIT TABS, 1 tablet, Disp: , Rfl:    Calcium Carbonate-Vitamin D (CALCIUM + D PO), Take 1 tablet by mouth daily. , Disp: , Rfl:    chlorpheniramine (CHLOR-TRIMETON) 4 MG tablet, Take 4 mg by mouth 2 (two) times daily as needed for allergies., Disp: , Rfl:    diazepam (VALIUM) 5 MG tablet, Take 5 mg by mouth daily as needed., Disp: , Rfl: 3   diclofenac sodium (VOLTAREN) 1 % GEL, APPLY 2 4 GRAMS 3 TO 4 TIMES DAILY, Disp: , Rfl:    fluticasone furoate-vilanterol (BREO ELLIPTA) 200-25 MCG/INH AEPB, TAKE 1 PUFF BY MOUTH EVERY DAY, Disp: 60 each, Rfl: 5   fluvastatin (LESCOL) 20 MG capsule, 1 capsule, Disp: , Rfl:    gabapentin (NEURONTIN) 100 MG capsule, Take 100-300 mg by mouth at bedtime as needed (pain or insomnia)., Disp: , Rfl:    Glucosamine-Chondroit-Vit C-Mn (GLUCOSAMINE CHONDR 1500 COMPLX PO), See admin instructions., Disp: , Rfl:    glucosamine-chondroitin 500-400 MG tablet, Take 1 tablet by mouth daily.  , Disp: , Rfl:    Magnesium 300 MG CAPS, See admin instructions., Disp: , Rfl:    meloxicam (MOBIC) 15 MG tablet, Take 7.5 mg by mouth daily., Disp: , Rfl: 2   methocarbamol (ROBAXIN) 750 MG tablet, Take 750 mg by mouth every 8 (eight) hours as needed., Disp: , Rfl:    Misc Natural Products (OSTEO BI-FLEX JOINT SHIELD PO), See admin instructions., Disp: , Rfl:    Omega-3 Fatty Acids (FISH OIL) 1000 MG CAPS, See admin instructions., Disp: , Rfl:    ondansetron (ZOFRAN) 4 MG tablet, Take 1 tablet (4 mg total) by mouth every 8 (eight) hours as needed for nausea or vomiting., Disp: 20 tablet, Rfl: 0   pantoprazole (PROTONIX) 40 MG tablet, Take  1 tablet (40 mg total) by mouth daily., Disp: 7 tablet, Rfl: 0   Potassium 75 MG TABS, See admin instructions., Disp: , Rfl:    PRALUENT 150 MG/ML SOAJ, , Disp: , Rfl:    Specialty Vitamins Products (MAGNESIUM, AMINO ACID CHELATE,) 133 MG tablet, Take 1 tablet by mouth daily.  ,  Disp: , Rfl:    SUPER B COMPLEX/C PO, See admin instructions., Disp: , Rfl:    traMADol (ULTRAM) 50 MG tablet, TAKE 1 TO 2 TABLETS BY MOUTH 2 TO 3 TIMES DAILY AS NEEDED, Disp: , Rfl:    vitamin B-12 (CYANOCOBALAMIN) 100 MCG tablet, 1 tablet, Disp: , Rfl:    VITAMIN D, CHOLECALCIFEROL, PO, Take 1 tablet by mouth daily. , Disp: , Rfl:     Physical Exam: Blood pressure 110/70, pulse 74, height 5\' 2"  (1.575 m), weight 130 lb (59 kg), SpO2 98 %.   Affect appropriate Healthy:  appears stated age 76: normal Neck supple with no adenopathy JVP normal no bruits no thyromegaly Lungs clear with no wheezing and good diaphragmatic motion Heart:  S1/S2 no murmur, no rub, gallop or click PMI normal Abdomen: benighn, BS positve, no tenderness, no AAA no bruit.  No HSM or HJR Distal pulses intact with no bruits No edema Neuro non-focal Skin warm and dry No muscular weakness   Labs:   Lab Results  Component Value Date   WBC 5.4 04/09/2019   HGB 13.4 04/09/2019   HCT 40.8 04/09/2019   MCV 89.9 04/09/2019   PLT 173.0 04/09/2019   No results for input(s): NA, K, CL, CO2, BUN, CREATININE, CALCIUM, PROT, BILITOT, ALKPHOS, ALT, AST, GLUCOSE in the last 168 hours.  Invalid input(s): LABALBU No results found for: CKTOTAL, CKMB, CKMBINDEX, TROPONINI  Lab Results  Component Value Date   CHOL 296 (H) 06/14/2019   CHOL 304 (H) 04/26/2019   Lab Results  Component Value Date   HDL 77 06/14/2019   HDL 74 04/26/2019   Lab Results  Component Value Date   LDLCALC 211 (H) 06/14/2019   LDLCALC 221 (H) 04/26/2019   Lab Results  Component Value Date   TRIG 59 06/14/2019   TRIG 63 04/26/2019   Lab Results  Component  Value Date   CHOLHDL 3.8 06/14/2019   CHOLHDL 4.1 04/26/2019   No results found for: LDLDIRECT    Radiology: No results found.   EKG:  SR rate 72 normal    ASSESSMENT AND PLAN:   1. Chest Pain:  Atypical cardiac CTA 05/31/19 normal  2. Dyspnea:  Asthma/Bronchiectasis f/u Pulmonary   3. HLD: Intolerant to multiple statins was on Praluent March 2021 but didn't inject herself properly and got frustrated  Check labs on Lescol refer back to lipid clinic for education on proper injection    Lipid clinic  Lipid/Liver  F/U in a year   Signed: Jenkins Rouge 02/13/2021, 9:06 AM

## 2021-01-31 ENCOUNTER — Ambulatory Visit: Payer: Medicare Other | Admitting: Podiatry

## 2021-01-31 ENCOUNTER — Other Ambulatory Visit: Payer: Self-pay

## 2021-01-31 DIAGNOSIS — Q828 Other specified congenital malformations of skin: Secondary | ICD-10-CM | POA: Diagnosis not present

## 2021-01-31 DIAGNOSIS — M79671 Pain in right foot: Secondary | ICD-10-CM | POA: Diagnosis not present

## 2021-01-31 DIAGNOSIS — M79674 Pain in right toe(s): Secondary | ICD-10-CM | POA: Diagnosis not present

## 2021-01-31 DIAGNOSIS — M79675 Pain in left toe(s): Secondary | ICD-10-CM | POA: Diagnosis not present

## 2021-01-31 DIAGNOSIS — L84 Corns and callosities: Secondary | ICD-10-CM

## 2021-01-31 DIAGNOSIS — M79672 Pain in left foot: Secondary | ICD-10-CM

## 2021-01-31 DIAGNOSIS — B351 Tinea unguium: Secondary | ICD-10-CM

## 2021-01-31 NOTE — Telephone Encounter (Signed)
Patient said she had sometime to think. She does not want the IV infusion, she asked if you thought her taking fosamax like 1 tablet twice a month, then gradually increase is an option? She is not every sure taking it gradually would be any benefit. She is willing to take mediation, but just nervous about starting medication 1 tablet every week. Please advise

## 2021-01-31 NOTE — Telephone Encounter (Signed)
If she isn't comfortable taking an oral bisphosphonate she could get a 1 x a year infusion of an IV bisphosphonate. This would prevent GI irritation. There have been isolated reports of kidney damage from the IV medication, particularly in patients with multiple myeloma or patients who are on diuretics. It's important to be well hydrated.  IV bisphosphonate may be associated with flu-like symptoms, this can be minimized by infusion over 30 minutes and anc be treated with tylenol or ibuprofen.

## 2021-02-01 NOTE — Telephone Encounter (Signed)
If she wants to try the fosamax every other week for the first few doses, I think that is fine. It doesn't make sense to use it that way long term.

## 2021-02-02 NOTE — Telephone Encounter (Signed)
She doesn't need prescription calcium. She should be getting a total of 1,200 mg a day of calcium between her diet and supplements. Again, I wouldn't recommend long term use of fosamax every other week. She can try that a few times, but then really needs to go to one dose a week.

## 2021-02-02 NOTE — Telephone Encounter (Signed)
Patient informed. 

## 2021-02-02 NOTE — Telephone Encounter (Signed)
Patient informed with below. She has 1 last question ( I explained to patient if she has further questions she should make office visit) patient asked you think she should take prescription calcium dose while between her taking fosamax every other week? Patient reports a "friend of hers" takes this and they are the same age. I did explained to patient her "friends" result may not be same as patients and typically providers recommend OTC calcium. Please advise

## 2021-02-04 ENCOUNTER — Encounter: Payer: Self-pay | Admitting: Podiatry

## 2021-02-04 NOTE — Progress Notes (Signed)
°  Subjective:  Patient ID: Diana Cisneros, female    DOB: Jul 05, 1945,  MRN: [id number]  Diana Cisneros presents to clinic today for painful porokeratotic lesion(s) bilaterally and painful mycotic toenails that limit ambulation. Painful toenails interfere with ambulation. Aggravating factors include wearing enclosed shoe gear. Pain is relieved with periodic professional debridement. Painful porokeratotic lesions are aggravated when weightbearing with and without shoegear. Pain is relieved with periodic professional debridement.  Patient voices no new pedal problems on today's visit.  PCP is Diana Laynie, MD , and last visit was 06/16/2020.  Allergies  Allergen Reactions   Morphine And Related Hives   Morphine Sulfate     Other reaction(s): itching/redface   Other     Other reaction(s): rash/lips swelling   Naproxen Sodium Rash    Review of Systems: Negative except as noted in the HPI. Objective:   Constitutional Diana Cisneros is a pleasant 76 y.o. Caucasian female, WD, WN in NAD. AAO x 3.   Vascular CFT immediate b/l LE. Palpable DP/PT pulses b/l LE. Digital hair present b/l. Skin temperature gradient WNL b/l. No pain with calf compression b/l. No edema noted b/l. No cyanosis or clubbing noted b/l LE.  Neurologic Normal speech. Oriented to person, place, and time. Protective sensation intact 5/5 intact bilaterally with 10g monofilament b/l. Vibratory sensation intact b/l. Proprioception intact bilaterally.  Dermatologic Pedal integument with normal turgor, texture and tone BLE. No open wounds b/l LE. No interdigital macerations noted b/l LE. Toenails L 2nd toe and R 2nd toe well maintained with adequate length. No erythema, no edema, no drainage, no fluctuance. Hyperkeratotic lesion(s) bilateral great toes and bilateral 2nd toes.  No erythema, no edema, no drainage, no fluctuance. Porokeratotic lesion(s) submet head 2 b/l. No erythema, no edema, no drainage, no fluctuance.  Orthopedic: Normal  muscle strength 5/5 to all lower extremity muscle groups bilaterally. HAV with bunion deformity noted b/l LE. Hammertoe(s) noted to the bilateral 5th toes.. No pain, crepitus or joint limitation noted with ROM b/l LE.  Patient ambulates independently without assistive aids.   Radiographs: None  Assessment:   1. Pain due to onychomycosis of toenails of both feet   2. Porokeratosis   3. Corns and callosities   4. Pain in both feet    Plan:  Patient was evaluated and treated and all questions answered. Consent given for treatment as described below: -Medicare ABN on file for CPT 11057. -Mycotic toenails bilateral 2nd toes were debrided in length and girth with sterile nail nippers and dremel without iatrogenic bleeding. -Corn(s) bilateral 2nd toes and callus(es) bilateral great toes were pared utilizing sterile scalpel blade without incident. Total number debrided =4. -Painful porokeratotic lesion(s) submet head 2 b/l pared and enucleated with sterile scalpel blade without incident. Total number of lesions debrided=2. -Patient/POA to call should there be question/concern in the interim.  Return in about 3 months (around 05/01/2021).  Marzetta Board, DPM

## 2021-02-13 ENCOUNTER — Other Ambulatory Visit: Payer: Self-pay

## 2021-02-13 ENCOUNTER — Encounter: Payer: Self-pay | Admitting: Cardiovascular Disease

## 2021-02-13 ENCOUNTER — Ambulatory Visit: Payer: Medicare Other | Admitting: Cardiovascular Disease

## 2021-02-13 VITALS — BP 110/70 | HR 74 | Ht 62.0 in | Wt 130.0 lb

## 2021-02-13 DIAGNOSIS — E782 Mixed hyperlipidemia: Secondary | ICD-10-CM | POA: Diagnosis not present

## 2021-02-13 DIAGNOSIS — R079 Chest pain, unspecified: Secondary | ICD-10-CM

## 2021-02-13 DIAGNOSIS — R0600 Dyspnea, unspecified: Secondary | ICD-10-CM

## 2021-02-13 NOTE — Patient Instructions (Addendum)
Medication Instructions:  Your physician recommends that you continue on your current medications as directed. Please refer to the Current Medication list given to you today.  *If you need a refill on your cardiac medications before your next appointment, please call your pharmacy*  Lab Work: Your physician recommends that you return for lab work this week for fasting lipid and liver panel.  If you have labs (blood work) drawn today and your tests are completely normal, you will receive your results only by: [place] (if you have MyChart) OR A paper copy in the mail If you have any lab test that is abnormal or we need to change your treatment, we will call you to review the results.  Testing/Procedures: None ordered today.  Follow-Up: At [facility], you and your health needs are our priority.  As part of our continuing mission to provide you with exceptional heart care, we have created designated Provider Care Teams.  These Care Teams include your primary Cardiologist (physician) and Advanced Practice Providers (APPs -  Physician Assistants and Nurse Practitioners) who all work together to provide you with the care you need, when you need it.  We recommend signing up for the patient portal called "MyChart".  Sign up information is provided on this After Visit Summary.  MyChart is used to connect with patients for Virtual Visits (Telemedicine).  Patients are able to view lab/test results, encounter notes, upcoming appointments, etc.  Non-urgent messages can be sent to your provider as well.   To learn more about what you can do with MyChart, go to [email or web address].    Your next appointment:   12 month(s)  The format for your next appointment:   In Person  Provider:   Jenkins Rouge, MD     Other Instructions You have been referred to Lipid Clinic to discuss praluent injections.

## 2021-02-14 ENCOUNTER — Other Ambulatory Visit: Payer: Medicare Other | Admitting: *Deleted

## 2021-02-14 DIAGNOSIS — E782 Mixed hyperlipidemia: Secondary | ICD-10-CM

## 2021-02-14 DIAGNOSIS — L309 Dermatitis, unspecified: Secondary | ICD-10-CM | POA: Diagnosis not present

## 2021-02-14 DIAGNOSIS — R0600 Dyspnea, unspecified: Secondary | ICD-10-CM | POA: Diagnosis not present

## 2021-02-14 DIAGNOSIS — R079 Chest pain, unspecified: Secondary | ICD-10-CM

## 2021-02-14 LAB — LIPID PANEL
Chol/HDL Ratio: 3.5 ratio (ref 0.0–4.4)
Cholesterol, Total: 232 mg/dL — ABNORMAL HIGH (ref 100–199)
HDL: 66 mg/dL (ref >39)
LDL Chol Calc (NIH): 154 mg/dL — ABNORMAL HIGH (ref 0–99)
Triglycerides: 70 mg/dL (ref 0–149)
VLDL Cholesterol Cal: 12 mg/dL (ref 5–40)

## 2021-02-14 LAB — HEPATIC FUNCTION PANEL
ALT: 26 IU/L (ref 0–32)
AST: 31 IU/L (ref 0–40)
Albumin: 4.4 g/dL (ref 3.7–4.7)
Alkaline Phosphatase: 80 IU/L (ref 44–121)
Bilirubin Total: 0.4 mg/dL (ref 0.0–1.2)
Bilirubin, Direct: <0.1 mg/dL (ref 0.00–0.40)
Total Protein: 6.5 g/dL (ref 6.0–8.5)

## 2021-02-15 ENCOUNTER — Telehealth: Payer: Self-pay | Admitting: Cardiovascular Disease

## 2021-02-15 NOTE — Telephone Encounter (Signed)
Per Dr. [name], LDL elevated 154 should be on Rx I think she has f/u with lipid clinic. Called patient back with Dr. Kyla Balzarine result note. Patient verbalized understanding and has an appointment with lipid clinic.

## 2021-02-15 NOTE — Telephone Encounter (Signed)
Patient was returning call but then got disconnect. Please advise

## 2021-02-28 ENCOUNTER — Ambulatory Visit: Payer: Medicare Other

## 2021-03-07 ENCOUNTER — Ambulatory Visit: Payer: Medicare Other

## 2021-03-18 IMAGING — CT CT HEART MORP W/ CTA COR W/ SCORE W/ CA W/CM &/OR W/O CM
4 of 7 series · 8 of 20 positions shown, 9 images · IV contrast (APPLIED)
Comparison: None.
COMPARISON: None.

Addendum:
EXAM:
OVER-READ INTERPRETATION  CT CHEST

The following report is an over-read performed by radiologist Dr.
Cm Tori [[email or web address]] on 05/31/2019. This
over-read does not include interpretation of cardiac or coronary
anatomy or pathology. The coronary calcium score/coronary CTA
interpretation by the cardiologist is attached.
CLINICAL DATA: Chest pain
Cardiac CTA
MEDICATIONS:
Sub lingual nitro. 4mg x 2
TECHNIQUE: The patient was scanned on a Siemens [REDACTED]ice scanner. Gantry
rotation speed was 250 msecs. Collimation was 0.6 mm. A 100 kV
prospective scan was triggered in the ascending thoracic aorta at
35-75% of the R-R interval. Average HR during the scan was 60 bpm.
The 3D data set was interpreted on a dedicated work station using
MPR, MIP and VRT modes. A total of 80cc of contrast was used.

[Series 6: best diast 74 % · axial · 0.36mm/px · z∈[-260,-211]mm · 2 of 368 slices shown, 3 images]
[im 123/368  vessel]
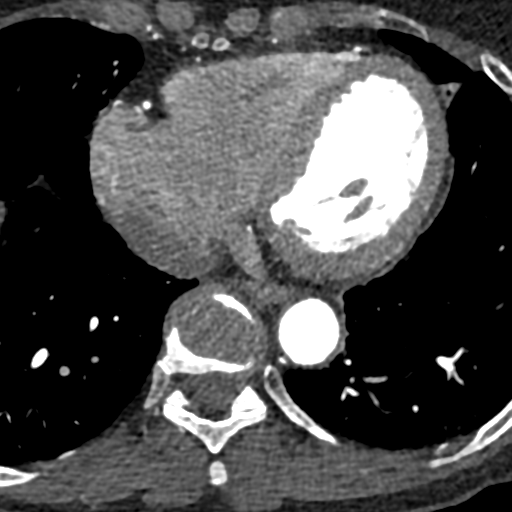
[im 123/368  lung]
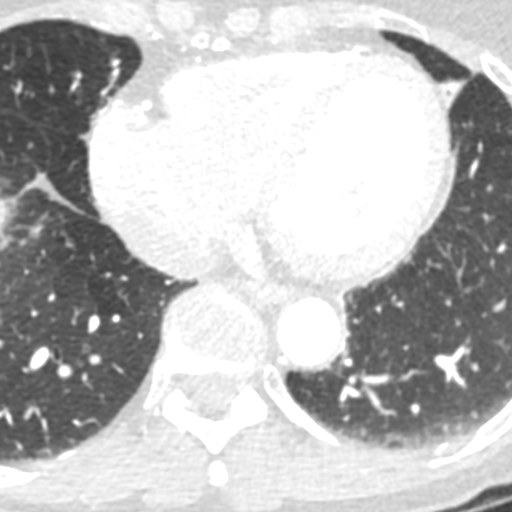
[im 245/368  vessel]
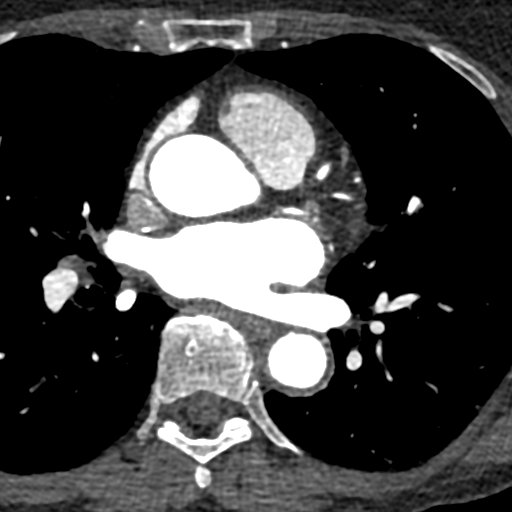

[Series 7: best syst 38 % · axial · 0.36mm/px · z∈[-260,-211]mm · 2 of 368 slices shown]
[im 123/368  vessel]
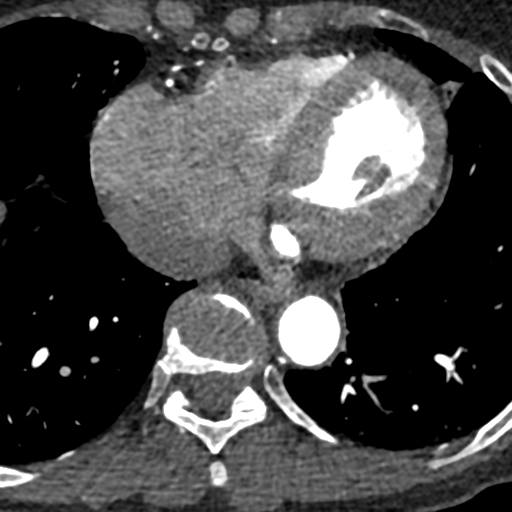
[im 245/368  vessel]
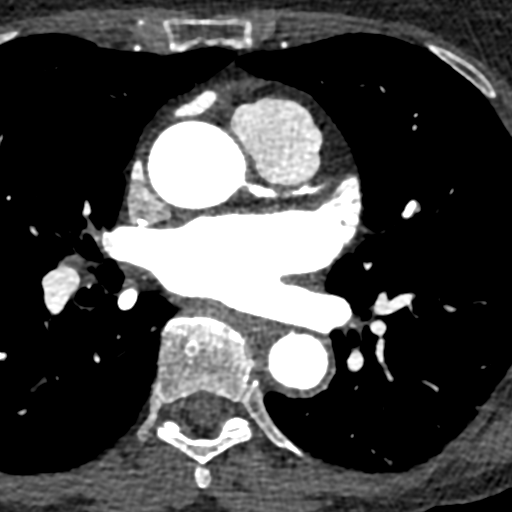

[Series 8: ts diast sharp 38 % · axial · 0.36mm/px · z∈[-260,-211]mm · 2 of 368 slices shown]
[im 123/368  lung]
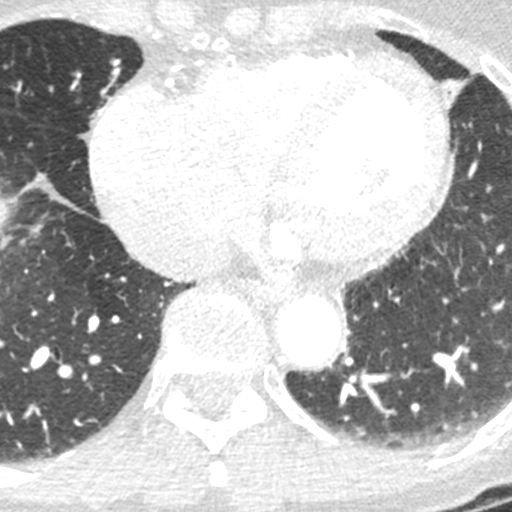
[im 245/368  lung]
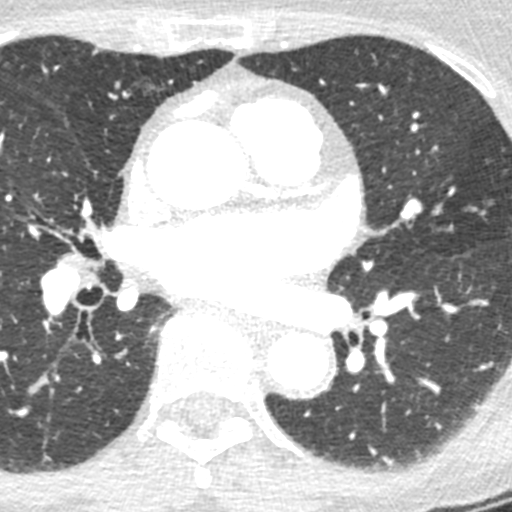

[Series 9: ts syst sharp 38 % · axial · 0.36mm/px · z∈[-260,-211]mm · 2 of 368 slices shown]
[im 123/368  lung]
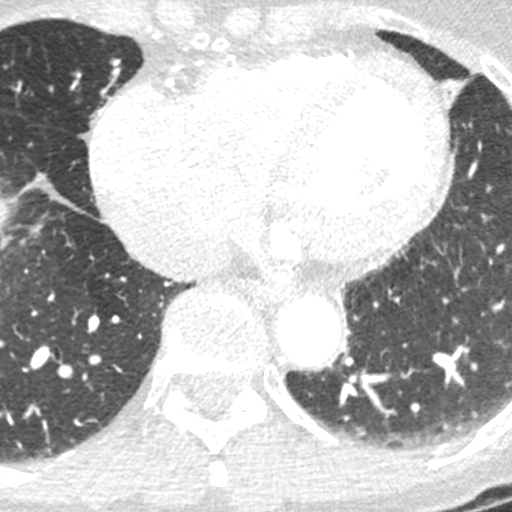
[im 245/368  lung]
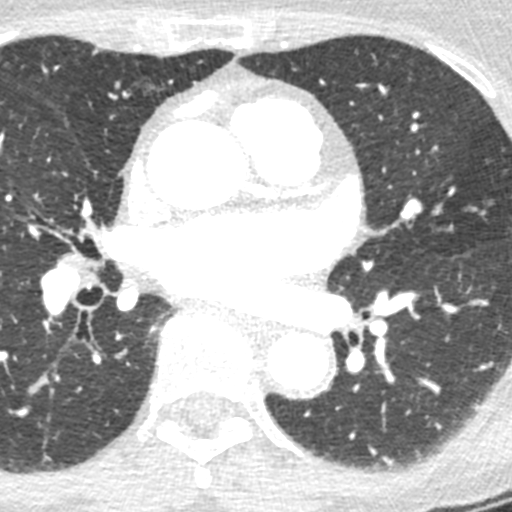

[8 of 20 positions shown; findings below may reference images not displayed]

FINDINGS: Aortic atherosclerosis. Scattered areas of mucoid impaction within
terminal bronchioles are noted in the lungs bilaterally, most
evident in the inferior segment of the lingula and in the right
middle lobe where there is also some very mild cylindrical
bronchiectasis and thickening of the peribronchovascular
interstitium within the visualized portions of the thorax there are
no suspicious appearing pulmonary nodules or masses, there is no
acute consolidative airspace disease, no pleural effusions, no
pneumothorax and no lymphadenopathy. Visualized portions of the
upper abdomen are unremarkable. There are no aggressive appearing
lytic or blastic lesions noted in the visualized portions of the
skeleton.
IMPRESSION: 1. Scattered areas of mild cylindrical bronchiectasis and mucoid
impaction in the lungs bilaterally, as above. Clinical correlation
for signs and symptoms of chronic indolent atypical infection such
as CAICEDO (mycobacterium avium intracellular) is recommended.
2. Aortic Atherosclerosis (3ICUG-YV1.1).
FINDINGS: Non-cardiac: See separate report from [REDACTED].

Pulmonary veins drain normally to the left atrium.

Calcium Score: 0 Agatston units.

Coronary Arteries: Right dominant with no anomalies

LM: No plaque or stenosis.

LAD system:  No plaque or stenosis.

Circumflex system: No plaque or stenosis.

RCA system: No plaque or stenosis.
IMPRESSION: 1. Coronary artery calcium score 0 Agatston units, suggesting low
risk for future cardiac events.

2.  No significant coronary disease noted.

Sang Hue Microlon

*** End of Addendum ***
EXAM:
OVER-READ INTERPRETATION  CT CHEST

The following report is an over-read performed by radiologist Dr.
Cm Tori [REDACTED] on 05/31/2019. This
over-read does not include interpretation of cardiac or coronary
anatomy or pathology. The coronary calcium score/coronary CTA
interpretation by the cardiologist is attached.
FINDINGS: Aortic atherosclerosis. Scattered areas of mucoid impaction within
terminal bronchioles are noted in the lungs bilaterally, most
evident in the inferior segment of the lingula and in the right
middle lobe where there is also some very mild cylindrical
bronchiectasis and thickening of the peribronchovascular
interstitium within the visualized portions of the thorax there are
no suspicious appearing pulmonary nodules or masses, there is no
acute consolidative airspace disease, no pleural effusions, no
pneumothorax and no lymphadenopathy. Visualized portions of the
upper abdomen are unremarkable. There are no aggressive appearing
lytic or blastic lesions noted in the visualized portions of the
skeleton.
IMPRESSION: 1. Scattered areas of mild cylindrical bronchiectasis and mucoid
impaction in the lungs bilaterally, as above. Clinical correlation
for signs and symptoms of chronic indolent atypical infection such
as CAICEDO (mycobacterium avium intracellular) is recommended.
2. Aortic Atherosclerosis (3ICUG-YV1.1).

## 2021-03-19 ENCOUNTER — Encounter: Payer: Self-pay | Admitting: Obstetrics and Gynecology

## 2021-03-23 ENCOUNTER — Ambulatory Visit: Payer: Medicare Other

## 2021-04-16 ENCOUNTER — Other Ambulatory Visit: Payer: Self-pay

## 2021-04-16 ENCOUNTER — Ambulatory Visit: Payer: Medicare Other | Admitting: Pharmacist

## 2021-04-16 DIAGNOSIS — E782 Mixed hyperlipidemia: Secondary | ICD-10-CM

## 2021-04-16 MED ORDER — FLUVASTATIN SODIUM 20 MG PO CAPS: 20.0000 mg | ORAL_CAPSULE | Freq: Every day | ORAL | 3 refills | Status: DC

## 2021-04-16 MED ORDER — PRALUENT 150 MG/ML ~~LOC~~ SOAJ: 1.0000 "pen " | SUBCUTANEOUS | 11 refills | Status: DC

## 2021-04-16 NOTE — Patient Instructions (Addendum)
Please continue taking fluvastatin '20mg'$  daily ? ?Start taking Praluent '150mg'$  every 14 days ? ?Please come Monday June 12 for fasting lab work. Lab opens at 7:15AM ? ?Please call me at [phone] with any questions ?

## 2021-04-16 NOTE — Progress Notes (Signed)
Patient ID: Diana Cisneros                 DOB: 12/16/1945                    MRN: [id number] ? ? ? ? ?HPI: ?Diana Cisneros is a 76 y.o. female patient referred to lipid clinic by Dr. [name]. PMH is significant for family hx of CAD, statin intolerances, HLD, CAC 0 in 2021, aortic atherosclerosis seen on CT. LDL was 221 in 2021. Was started on Praluent in 2021 but it appears was unable to give herself the shot. ? ?Patient presents today to lipid clinic. She states she get muscle cramps often. Has been on lovastatin '20mg'$ . Did ok on it. Was increased to '40mg'$  and her cramps got worse so she has decreased back to '20mg'$  daily. She has issues giving herself her Praluent injection back in 2021, decided not to try again. Never got an injection. States she is ready to do the injections now. She is active with the [facility].  ? ?Current Medications: fluvastatin '20mg'$  daily ?Intolerances:  ?Risk Factors: LDL >190, Aortic atherosclerosis, family hx ?LDL goal: <70 ? ?Diet: breakfast: yogurt, granola, honey, coffee or tea ?Lunch: salad w/ cheese with [place] or chicken- vinegrette or blue cheese ?Dinner: spagetti, fish- from [employer], biggest weakness is french fries ?Dessert twice a week ?Starbucks- chi tea latte (4/7 days), club soda, smart water ? ?Exercise: walks dogs ? ?Family History: Fatal heart attack in mother (age 68) and heart disease in maternal grandmother, fatal stroke in father (age 63), diabetes in maternal grandmother and grandfather, cancer in maternal grandfather (colon) and paternal grandmother (breast, age 59) ? ?Social History: Former smoker (quit 1965), alcohol use (2 drinks wine/week) ? ?Labs: ?02/14/21: TC 232  TG 70  HDL 66  LDL 154 (fluvastatin '20mg'$  daily) ?04/26/19: TC 304, TG 63, HDL 74, LDL 221; LFTs and Scr wnl (no lipid-lowering agents) ?09/15/18: TC 293, TG 81, HDL 73, LDL 203, non-HDL 220; LFTs and Scr wnl (no lipid-lowering agents) ? ?Past Medical History:  ?Diagnosis Date  ? Allergic rhinitis   ?  Anxiety   ? Anxiousness   ? Degenerative disc disease   ? thoracic,lumbar  ? Hypercholesterolemia   ? Hyperlipidemia   ? Low back pain   ? Moderate persistent asthma without complication 9/56/3875  ? Muscle cramps   ? Nail dystrophy 12/30/2018  ? Osteopenia 10/2018  ? T score -2.2 distal third of forearm.  -1.6 left femoral neck FRAX 18% / 6.7%  ? Seasonal allergies   ? Sinusitis, acute   ? URI (upper respiratory infection)   ? ? ?Current Outpatient Medications on File Prior to Visit  ?Medication Sig Dispense Refill  ? alendronate (FOSAMAX) 70 MG tablet Take 1 tablet (70 mg total) by mouth every 7 (seven) days. Take first thing in am with 6 oz. Water.  Be upright after taking.  Eat nothing for one hour. 12 tablet 3  ? Ascorbic Acid (VITAMIN C) 100 MG tablet Take 100 mg by mouth daily.      ? augmented betamethasone dipropionate (DIPROLENE-AF) 0.05 % cream APPLY TO AFFECTED AREA AS NEEDED 50 g 0  ? Calcium Carbonate+Vitamin D 600-200 MG-UNIT TABS 1 tablet    ? Calcium Carbonate-Vitamin D (CALCIUM + D PO) Take 1 tablet by mouth daily.     ? chlorpheniramine (CHLOR-TRIMETON) 4 MG tablet Take 4 mg by mouth 2 (two) times daily as needed for allergies.    ?  diazepam (VALIUM) 5 MG tablet Take 5 mg by mouth daily as needed.  3  ? diclofenac sodium (VOLTAREN) 1 % GEL APPLY 2 4 GRAMS 3 TO 4 TIMES DAILY    ? fluticasone furoate-vilanterol (BREO ELLIPTA) 200-25 MCG/INH AEPB TAKE 1 PUFF BY MOUTH EVERY DAY 60 each 5  ? fluvastatin (LESCOL) 20 MG capsule 1 capsule    ? gabapentin (NEURONTIN) 100 MG capsule Take 100-300 mg by mouth at bedtime as needed (pain or insomnia).    ? Glucosamine-Chondroit-Vit C-Mn (GLUCOSAMINE CHONDR 1500 COMPLX PO) See admin instructions.    ? glucosamine-chondroitin 500-400 MG tablet Take 1 tablet by mouth daily.      ? Magnesium 300 MG CAPS See admin instructions.    ? meloxicam (MOBIC) 15 MG tablet Take 7.5 mg by mouth daily.  2  ? methocarbamol (ROBAXIN) 750 MG tablet Take 750 mg by mouth every 8  (eight) hours as needed.    ? Misc Natural Products (OSTEO BI-FLEX JOINT SHIELD PO) See admin instructions.    ? Omega-3 Fatty Acids (FISH OIL) 1000 MG CAPS See admin instructions.    ? ondansetron (ZOFRAN) 4 MG tablet Take 1 tablet (4 mg total) by mouth every 8 (eight) hours as needed for nausea or vomiting. 20 tablet 0  ? pantoprazole (PROTONIX) 40 MG tablet Take 1 tablet (40 mg total) by mouth daily. 7 tablet 0  ? Potassium 75 MG TABS See admin instructions.    ? PRALUENT 150 MG/ML SOAJ     ? Specialty Vitamins Products (MAGNESIUM, AMINO ACID CHELATE,) 133 MG tablet Take 1 tablet by mouth daily.      ? SUPER B COMPLEX/C PO See admin instructions.    ? traMADol (ULTRAM) 50 MG tablet TAKE 1 TO 2 TABLETS BY MOUTH 2 TO 3 TIMES DAILY AS NEEDED    ? vitamin B-12 (CYANOCOBALAMIN) 100 MCG tablet 1 tablet    ? VITAMIN D, CHOLECALCIFEROL, PO Take 1 tablet by mouth daily.     ? ?No current facility-administered medications on file prior to visit.  ? ? ?Allergies  ?Allergen Reactions  ? Morphine And Related Hives  ? Morphine Sulfate   ?  Other reaction(s): itching/redface  ? Other   ?  Other reaction(s): rash/lips swelling  ? Naproxen Sodium Rash  ? ? ?Assessment/Plan: ? ?1. Hyperlipidemia - LDL is above goal of <70 due to aortic arthrosclerosis and possible FH. Baseline LDL-C is >200. Continue fluvastatin '20mg'$  daily and add Praluent '150mg'$  q 14 days. PA is still active. Rx sent to pharmacy. Reviewed injection technique with patient. She was able to replicate with the demo pen. Reviewed side effects and storage. Will recheck lipid panel and Apo-B in 3 months. ?We did also review diet, recommend decreasing fried foods (french fries) and watch sugar content of granola and starbucks chi tea latte. ? ? ?Thank you, ? ?Ramond Dial, Pharm.D, BCPS, CPP ?[id number] [place], [place] [address]  ?Phone: [phone]; Fax: [phone]  ? ? ?

## 2021-04-19 ENCOUNTER — Telehealth: Payer: Self-pay | Admitting: Pharmacist

## 2021-04-19 NOTE — Telephone Encounter (Signed)
Returned patient call. She wanted to know about a stronger Rx calcium. I advised that I do not generally recommend calcium supplements. She also ask about something for cramps. Already taking magnesium and K. This is generally what I recommend plus intensive stretching- she states she does stretch. Also recommended a foam roller or massage gun. Patient thanked me for the call. ?

## 2021-04-19 NOTE — Telephone Encounter (Signed)
Patient called and left VM on CVRR machine requesting a call back ?

## 2021-04-25 ENCOUNTER — Other Ambulatory Visit: Payer: Self-pay | Admitting: Pulmonary Disease

## 2021-05-16 ENCOUNTER — Telehealth: Payer: Self-pay | Admitting: Cardiovascular Disease

## 2021-05-16 DIAGNOSIS — L578 Other skin changes due to chronic exposure to nonionizing radiation: Secondary | ICD-10-CM | POA: Diagnosis not present

## 2021-05-16 DIAGNOSIS — L720 Epidermal cyst: Secondary | ICD-10-CM | POA: Diagnosis not present

## 2021-05-16 DIAGNOSIS — L820 Inflamed seborrheic keratosis: Secondary | ICD-10-CM | POA: Diagnosis not present

## 2021-05-16 DIAGNOSIS — Z85828 Personal history of other malignant neoplasm of skin: Secondary | ICD-10-CM | POA: Diagnosis not present

## 2021-05-16 DIAGNOSIS — L570 Actinic keratosis: Secondary | ICD-10-CM | POA: Diagnosis not present

## 2021-05-16 DIAGNOSIS — D225 Melanocytic nevi of trunk: Secondary | ICD-10-CM | POA: Diagnosis not present

## 2021-05-16 MED ORDER — ROSUVASTATIN CALCIUM 20 MG PO TABS: 20.0000 mg | ORAL_TABLET | Freq: Every day | ORAL | 11 refills | Status: DC

## 2021-05-16 NOTE — Telephone Encounter (Signed)
Patient is having a problem with the injectable cholesterol medication and she wants to change to the pill form.  ?

## 2021-05-16 NOTE — Telephone Encounter (Signed)
Returned call to pt. Seen by Lenna Sciara 04/16/21 and started on Praluent. States she's having trouble with injections and has had to throw out 3 injections so far. Prefers to take pills for her cholesterol. Looks like she has only taken fluvastatin in the past and nothing else (cramping on '40mg'$ , tolerated '20mg'$ ). Will stop fluvastatin '20mg'$  daily and start rosuvastatin '20mg'$  daily. Moved up lab appt to 6 weeks from now. Will likely need add-on therapy as her baseline LDL is > 200 but will assess response to statin therapy first before considering either dose increase of rosuvastatin if tolerating well or adding on either ezetimibe or Nexlizet pending further LDL lowering required at that time. Pt appreciative for the call. ?

## 2021-06-01 ENCOUNTER — Ambulatory Visit: Payer: Medicare Other | Admitting: Podiatry

## 2021-06-01 ENCOUNTER — Encounter: Payer: Self-pay | Admitting: Podiatry

## 2021-06-01 DIAGNOSIS — M79672 Pain in left foot: Secondary | ICD-10-CM | POA: Diagnosis not present

## 2021-06-01 DIAGNOSIS — M79671 Pain in right foot: Secondary | ICD-10-CM | POA: Diagnosis not present

## 2021-06-01 DIAGNOSIS — L84 Corns and callosities: Secondary | ICD-10-CM

## 2021-06-01 DIAGNOSIS — Q828 Other specified congenital malformations of skin: Secondary | ICD-10-CM | POA: Diagnosis not present

## 2021-06-11 NOTE — Progress Notes (Signed)
?  Subjective:  ?Patient ID: Diana Cisneros, female    DOB: 1945-06-22,  MRN: [id number] ? ?Diana Cisneros presents to clinic today for painful porokeratotic lesion(s) b/l lower extremities and painful mycotic toenails that limit ambulation. Painful toenails interfere with ambulation. Aggravating factors include wearing enclosed shoe gear. Pain is relieved with periodic professional debridement. Painful porokeratotic lesions are aggravated when weightbearing with and without shoegear. Pain is relieved with periodic professional debridement. ? ?New problem(s): None.  ? ?PCP is Diana Rayanne, MD , and last visit was June, 2022. ? ?Allergies  ?Allergen Reactions  ? Morphine And Related Hives  ? Morphine Sulfate   ?  Other reaction(s): itching/redface  ? Other   ?  Other reaction(s): rash/lips swelling  ? Naproxen Sodium Rash  ? ? ?Review of Systems: Negative except as noted in the HPI. ? ?Objective: No changes noted in today's physical examination. ?Constitutional Diana Cisneros is a pleasant 76 y.o. Caucasian female, WD, WN in NAD. AAO x 3.   ?Vascular CFT immediate b/l LE. Palpable DP/PT pulses b/l LE. Digital hair present b/l. Skin temperature gradient WNL b/l. No pain with calf compression b/l. No edema noted b/l. No cyanosis or clubbing noted b/l LE.  ?Neurologic Normal speech. Oriented to person, place, and time. Protective sensation intact 5/5 intact bilaterally with 10g monofilament b/l. Vibratory sensation intact b/l. Proprioception intact bilaterally.  ?Dermatologic Pedal integument with normal turgor, texture and tone BLE. No open wounds b/l LE. No interdigital macerations noted b/l LE. Toenails L 2nd toe and R 2nd toe well maintained with adequate length. No erythema, no edema, no drainage, no fluctuance. Hyperkeratotic lesion(s) bilateral great toes and bilateral 2nd toes.  No erythema, no edema, no drainage, no fluctuance. Porokeratotic lesion(s) submet head 2 b/l. No erythema, no edema, no drainage, no  fluctuance.  ?Orthopedic: Normal muscle strength 5/5 to all lower extremity muscle groups bilaterally. HAV with bunion deformity noted b/l LE. Hammertoe(s) noted to the bilateral 5th toes.. No pain, crepitus or joint limitation noted with ROM b/l LE.  Patient ambulates independently without assistive aids.  ? ?Radiographs: None  ? ?Assessment/Plan: ?1. Porokeratosis   ?2. Corns and callosities   ?3. Pain in both feet   ?  ?-Patient was evaluated and treated. All patient's and/or POA's questions/concerns answered on today's visit. ?-Medicare ABN signed. Patient consents for services of paring of lesions  today. Copy has been placed in patient's chart. ?-Corn(s) bilateral 2nd toes and callus(es) bilateral great toes were pared utilizing sterile scalpel blade without incident. Total number debrided =4. ?-Porokeratotic lesion(s) submet head 2 left foot and submet head 2 right foot pared and enucleated with sterile scalpel blade without incident. Total number of lesions debrided=2. ?-Patient/POA to call should there be question/concern in the interim.  ? ?Return in about 3 months (around 09/01/2021). ? ?Marzetta Board, DPM  ?

## 2021-06-27 ENCOUNTER — Other Ambulatory Visit: Payer: Medicare Other

## 2021-07-09 ENCOUNTER — Other Ambulatory Visit: Payer: Medicare Other

## 2021-07-18 DIAGNOSIS — M1712 Unilateral primary osteoarthritis, left knee: Secondary | ICD-10-CM | POA: Diagnosis not present

## 2021-07-18 DIAGNOSIS — M25461 Effusion, right knee: Secondary | ICD-10-CM | POA: Diagnosis not present

## 2021-07-18 DIAGNOSIS — M1711 Unilateral primary osteoarthritis, right knee: Secondary | ICD-10-CM | POA: Diagnosis not present

## 2021-07-18 DIAGNOSIS — M170 Bilateral primary osteoarthritis of knee: Secondary | ICD-10-CM | POA: Diagnosis not present

## 2021-07-27 ENCOUNTER — Other Ambulatory Visit: Payer: Medicare Other

## 2021-07-27 DIAGNOSIS — E782 Mixed hyperlipidemia: Secondary | ICD-10-CM

## 2021-07-28 LAB — HEPATIC FUNCTION PANEL
ALT: 23 IU/L (ref 0–32)
AST: 26 IU/L (ref 0–40)
Albumin: 4.4 g/dL (ref 3.7–4.7)
Alkaline Phosphatase: 65 IU/L (ref 44–121)
Bilirubin Total: 0.5 mg/dL (ref 0.0–1.2)
Bilirubin, Direct: 0.15 mg/dL (ref 0.00–0.40)
Total Protein: 6.2 g/dL (ref 6.0–8.5)

## 2021-07-28 LAB — LIPID PANEL
Chol/HDL Ratio: 2.4 ratio (ref 0.0–4.4)
Cholesterol, Total: 192 mg/dL (ref 100–199)
HDL: 79 mg/dL (ref >39)
LDL Chol Calc (NIH): 105 mg/dL — ABNORMAL HIGH (ref 0–99)
Triglycerides: 43 mg/dL (ref 0–149)
VLDL Cholesterol Cal: 8 mg/dL (ref 5–40)

## 2021-07-28 LAB — APOLIPOPROTEIN B: Apolipoprotein B: 88 mg/dL (ref <90)

## 2021-08-01 ENCOUNTER — Other Ambulatory Visit: Payer: Self-pay | Admitting: Pharmacist

## 2021-08-01 ENCOUNTER — Other Ambulatory Visit: Payer: Self-pay | Admitting: Internal Medicine

## 2021-08-01 DIAGNOSIS — Z1231 Encounter for screening mammogram for malignant neoplasm of breast: Secondary | ICD-10-CM

## 2021-08-03 ENCOUNTER — Telehealth: Payer: Self-pay | Admitting: Pharmacist

## 2021-08-03 DIAGNOSIS — E782 Mixed hyperlipidemia: Secondary | ICD-10-CM

## 2021-08-03 NOTE — Telephone Encounter (Signed)
Called pt to review labs. She states she is not taking praluent. Couldn't get injection to work. She thinks she is taking rosuvastatin but will call me to confirm.

## 2021-08-07 NOTE — Telephone Encounter (Signed)
Patient has 60 days worth of fluvastatin at home. She really wants to finish it out before she switches to rosuvastatin. Advised this was ok. Will recheck labs in 4 months.

## 2021-08-07 NOTE — Telephone Encounter (Signed)
Fill hx suggests patient is on rosuvastatin '20mg'$  daily.  LDL-C 105, Apo B 88 Goal LDL <70 ApoB <80  She has tried twice now to use Praluent. Baseline LDL-C is >200.   I called patient back. She states that she looked at her bottles and she has been taking fluvastatin '40mg'$  because she wanted to use up what she has before she started the rosuvastatin. She requested she call be back with how much fluvastatin she has before switching to rosuvastatin.

## 2021-09-03 ENCOUNTER — Ambulatory Visit
Admission: RE | Admit: 2021-09-03 | Discharge: 2021-09-03 | Disposition: A | Discharge status: Home / Self Care | Payer: Medicare Other | Source: Ambulatory Visit | Attending: Internal Medicine | Admitting: Internal Medicine

## 2021-09-03 ENCOUNTER — Ambulatory Visit: Payer: Medicare Other | Admitting: Podiatry

## 2021-09-03 DIAGNOSIS — Q828 Other specified congenital malformations of skin: Secondary | ICD-10-CM | POA: Diagnosis not present

## 2021-09-03 DIAGNOSIS — B351 Tinea unguium: Secondary | ICD-10-CM | POA: Diagnosis not present

## 2021-09-03 DIAGNOSIS — Z1231 Encounter for screening mammogram for malignant neoplasm of breast: Secondary | ICD-10-CM | POA: Diagnosis not present

## 2021-09-03 DIAGNOSIS — M79672 Pain in left foot: Secondary | ICD-10-CM

## 2021-09-03 DIAGNOSIS — M79674 Pain in right toe(s): Secondary | ICD-10-CM

## 2021-09-03 DIAGNOSIS — L84 Corns and callosities: Secondary | ICD-10-CM | POA: Diagnosis not present

## 2021-09-03 DIAGNOSIS — M79675 Pain in left toe(s): Secondary | ICD-10-CM | POA: Diagnosis not present

## 2021-09-03 DIAGNOSIS — M79671 Pain in right foot: Secondary | ICD-10-CM

## 2021-09-05 ENCOUNTER — Ambulatory Visit
Admission: RE | Admit: 2021-09-05 | Discharge: 2021-09-05 | Disposition: A | Discharge status: Home / Self Care | Payer: Medicare Other | Source: Ambulatory Visit | Attending: Pulmonary Disease | Admitting: Pulmonary Disease

## 2021-09-05 DIAGNOSIS — J479 Bronchiectasis, uncomplicated: Secondary | ICD-10-CM | POA: Diagnosis not present

## 2021-09-05 DIAGNOSIS — J45909 Unspecified asthma, uncomplicated: Secondary | ICD-10-CM | POA: Diagnosis not present

## 2021-09-05 DIAGNOSIS — I700 Atherosclerosis of aorta: Secondary | ICD-10-CM | POA: Diagnosis not present

## 2021-09-05 DIAGNOSIS — J949 Pleural condition, unspecified: Secondary | ICD-10-CM | POA: Diagnosis not present

## 2021-09-05 DIAGNOSIS — R059 Cough, unspecified: Secondary | ICD-10-CM

## 2021-09-09 ENCOUNTER — Encounter: Payer: Self-pay | Admitting: Podiatry

## 2021-09-09 NOTE — Progress Notes (Signed)
  Subjective:  Patient ID: Diana Cisneros, female    DOB: [birth year],  MRN: [id number]  Diana Cisneros presents to clinic today for corn(s)  b/l lower extremities, porokeratotic lesion(s) b/l lower extremities and painful mycotic nails. Painful toenails interfere with ambulation. Aggravating factors include wearing enclosed shoe gear. Pain is relieved with periodic professional debridement. Painful corns and porokeratotic lesion(s) aggravated when weightbearing with and without shoegear. Pain is relieved with periodic professional debridement.  New problem(s): None.   PCP is Seward Kahleah, MD , and last visit was  December 13, 2020  Allergies  Allergen Reactions   Morphine And Related Hives   Morphine Sulfate     Other reaction(s): itching/redface   Other     Other reaction(s): rash/lips swelling   Naproxen Sodium Rash    Review of Systems: Negative except as noted in the HPI.  Objective: No changes noted in today's physical examination. Constitutional Diana Cisneros is a pleasant 76 y.o. Caucasian female, WD, WN in NAD. AAO x 3.   Vascular CFT immediate b/l LE. Palpable DP/PT pulses b/l LE. Digital hair present b/l. Skin temperature gradient WNL b/l. No pain with calf compression b/l. No edema noted b/l. No cyanosis or clubbing noted b/l LE.  Neurologic Normal speech. Oriented to person, place, and time. Protective sensation intact 5/5 intact bilaterally with 10g monofilament b/l. Vibratory sensation intact b/l. Proprioception intact bilaterally.  Dermatologic Pedal integument with normal turgor, texture and tone BLE. No open wounds b/l LE. No interdigital macerations noted b/l LE. Toenails 1-5 b/l are painful, elongated, discolored, dystrophic with subungual debris. Pain with dorsal palpation of nailplates. No erythema, no edema, no drainage noted. Hyperkeratotic lesion(s) bilateral great toes and bilateral 2nd toes.  No erythema, no edema, no drainage, no fluctuance. Porokeratotic lesion(s)  submet head 2 b/l. No erythema, no edema, no drainage, no fluctuance.  Orthopedic: Normal muscle strength 5/5 to all lower extremity muscle groups bilaterally. HAV with bunion deformity noted b/l LE. Hammertoe(s) noted to the bilateral 5th toes.. No pain, crepitus or joint limitation noted with ROM b/l LE.  Patient ambulates independently without assistive aids.   Radiographs: None   Assessment/Plan: 1. Pain due to onychomycosis of toenails of both feet   2. Corns and callosities   3. Porokeratosis   4. Pain in both feet     -Examined patient. -Medicaid ABN on file for paing of corns and porokeratoses. -Patient to continue soft, supportive shoe gear daily. -Toenails 1-5 b/l were debrided in length and girth with sterile nail nippers and dremel without iatrogenic bleeding.  -Corn(s) bilateral 2nd toes and callus(es) bilateral great toes were pared utilizing sterile scalpel blade without incident. Total number debrided =4. -Porokeratotic lesion(s) submet head 2 b/l pared and enucleated with sterile scalpel blade without incident. Total number of lesions debrided=2. -Patient/POA to call should there be question/concern in the interim.   Return in about 3 months (around 12/04/2021).  Marzetta Board, DPM

## 2021-09-17 ENCOUNTER — Ambulatory Visit: Payer: Medicare Other | Admitting: Pulmonary Disease

## 2021-09-17 ENCOUNTER — Encounter: Payer: Self-pay | Admitting: Pulmonary Disease

## 2021-09-17 VITALS — BP 138/80 | HR 84 | Temp 97.8°F | Ht 62.0 in | Wt 121.8 lb

## 2021-09-17 DIAGNOSIS — J4540 Moderate persistent asthma, uncomplicated: Secondary | ICD-10-CM

## 2021-09-17 DIAGNOSIS — R053 Chronic cough: Secondary | ICD-10-CM

## 2021-09-17 NOTE — Patient Instructions (Signed)
Glad you are doing well with your breathing He can use Flonase nasal spray and extra dose of chlorpheniramine during the daytime for nasal congestion Continue breo His CT scan shows mild symmetric changes which are unchanged Follow-up in 6 months.

## 2021-09-17 NOTE — Progress Notes (Signed)
Diana Cisneros    [id number]    01-03-1946  Primary Care Physician:Polite, Jori Moll, MD  Referring Physician: Seward Keana, MD [address] [place],  [place] [address]  Chief complaint: Follow-up for cough, asthma  HPI: 76 year old with history of allergic rhinitis, hyperlipidemia.  Complains of chronic cough for the past 1 to 2 months associated with clear to white mucus.  She has minimal dyspnea on exertion.  No symptoms at rest.  No wheezing, fevers, chills Cough is exacerbated on tilting her neck back and lying down.  She has history of allergic rhinitis, postnasal drip and takes loratadine at night.  No symptoms of GERD.  Pets: Has a dog and a cat. 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  No recent exposure.  No dampness, Jacuzzi, hot tub. Smoking history: Minimal smoking as a teenager. Travel history: Originally from [place].  Lived in [place].  No significant recent travel. Relevant family history: No significant family history of lung disease.  Interim history: Breathing is stable on the Breo inhaler which is helping.  Has mild head congestion and sinus fullness  Outpatient Encounter Medications as of 09/17/2021  Medication Sig   alendronate (FOSAMAX) 70 MG tablet Take 1 tablet (70 mg total) by mouth every 7 (seven) days. Take first thing in am with 6 oz. Water.  Be upright after taking.  Eat nothing for one hour. (Patient not taking: Reported on 04/16/2021)   Ascorbic Acid (VITAMIN C) 100 MG tablet Take 100 mg by mouth daily.     augmented betamethasone dipropionate (DIPROLENE-AF) 0.05 % cream APPLY TO AFFECTED AREA AS NEEDED   BREO ELLIPTA 200-25 MCG/ACT AEPB INHALE 1 PUFF BY MOUTH EVERY DAY   Calcium Carbonate+Vitamin D 600-200 MG-UNIT TABS 1 tablet   Calcium Carbonate-Vitamin D (CALCIUM + D PO) Take 1 tablet by mouth daily.     chlorpheniramine (CHLOR-TRIMETON) 4 MG tablet Take 4 mg by mouth 2 (two) times daily as needed for allergies.   diazepam (VALIUM) 5 MG tablet Take 5 mg by mouth daily as needed.   diclofenac sodium (VOLTAREN) 1 % GEL APPLY 2 4 GRAMS 3 TO 4 TIMES DAILY   fluticasone (CUTIVATE) 0.005 % ointment SMARTSIG:Sparingly Topical Twice Daily   gabapentin (NEURONTIN) 100 MG capsule Take 100-300 mg by mouth at bedtime as needed (pain or insomnia).   Glucosamine-Chondroit-Vit C-Mn (GLUCOSAMINE CHONDR 1500 COMPLX PO) See admin instructions.   glucosamine-chondroitin 500-400 MG tablet Take 1 tablet by mouth daily.     Magnesium 300 MG CAPS See admin instructions.   meloxicam (MOBIC) 15 MG tablet Take 7.5 mg by mouth daily.   methocarbamol (ROBAXIN) 750 MG tablet Take 750 mg by mouth every 8 (eight) hours as needed.   Misc Natural Products (OSTEO BI-FLEX JOINT SHIELD PO) See admin instructions.   ondansetron (ZOFRAN) 4 MG tablet Take 1 tablet (4 mg total) by mouth every 8 (eight) hours as needed for nausea or vomiting.   pantoprazole (PROTONIX) 40 MG tablet Take 1 tablet (40 mg total) by mouth daily.   Potassium 75 MG TABS See admin instructions.   rosuvastatin (CRESTOR) 20 MG tablet Take 1 tablet (20 mg total) by mouth daily.   Specialty Vitamins Products (MAGNESIUM, AMINO ACID CHELATE,) 133 MG tablet Take 1 tablet by mouth daily.     SUPER B COMPLEX/C PO See admin  instructions.   traMADol (ULTRAM) 50 MG tablet TAKE 1 TO 2 TABLETS BY MOUTH 2 TO 3 TIMES DAILY AS NEEDED   vitamin B-12 (CYANOCOBALAMIN) 100 MCG tablet 1 tablet   VITAMIN D, CHOLECALCIFEROL, PO Take 1 tablet by mouth daily.    No facility-administered encounter medications on file as of 09/17/2021.   Physical Exam: Blood pressure (!) 142/70, pulse 64, temperature 97.8 F (36.6 C), temperature source Oral, height '5\' 2"'$  (1.575 m), weight 129 lb (58.5 kg), SpO2 98 %. Gen:      No acute distress HEENT:  EOMI, sclera anicteric Neck:     No masses;  no thyromegaly Lungs:    Clear to auscultation bilaterally; normal respiratory effort CV:         Regular rate and rhythm; no murmurs Abd:      + bowel sounds; soft, non-tender; no palpable masses, no distension Ext:    No edema; adequate peripheral perfusion Skin:      Warm and dry; no rash Neuro: alert and oriented x 3 Psych: normal mood and affect   Data Reviewed: Imaging: CT chest 04/09/2007-visualized lung bases are normal. Chest x-ray 06/01/2015-clear lungs, no acute cardiopulmonary abnormality. Chest x-ray 10/29/2017- no active lung disease, chronic scarring the right middle lobe and lingula. Cardiac CT 05/31/2019-visualized lungs show mild bronchiectasis with tree-in-bud appearance. High-resolution CT 08/26/2019-mild areas of centrilobular nodularity, tree-in-bud with bronchial plugging. High-resolution CT 09/05/2021-stable areas of centrilobular nodularity, tree-in-bud I have reviewed the images personally.  PFTs: 11/24/2017 FVC 2.35 [89%), FEV1 1.53 [77%), F/F 65 post FEV1 1.90 [96%, +23%), TLC 115%, DLCO 18% Moderate obstruction with significant bronchodilator response and air trapping.  FENO 10/29/2017-18 FENO 11/24/2017-23  ACT score  04/09/2019-22 09/17/2021-24  Labs: CBC 10/29/2017-WBC 5.2, eos 2.8%, absolute eosinophil count 146 CBC 04/01/2019-WBC 5.4, eos 2.1%, absolute eosinophil count 113  Respiratory allergy profile allergy profile 10/29/2017-IgE 31, RAST panel-negative Respiratory allergy profile 04/09/2019-IgE 28, RAST panel negative  Sputum cultures 08/31/2019-Enterobacter, AFB is negative  Assessment:  Follow-up for chronic cough Suspect upper airway cough from postnasal drip, possibly silent acid reflux Symptoms improved with chlorpheniramine, Prilosec  Has mild flareup of sinus congestion.  She will try steroid nasal spray and increase chlorpheniramine to twice daily  Asthma PFTs reviewed with obstruction and significant bronchodilator response No evidence  elevated IgE or FENO, mild elevation in peripheral eosinophilia  Stable on Breo  Mild bronchiectasis Has mild bronchiectasis on CT scan with tree-in-bud.  Treated for Enterobacter in sputum in 2021.  Sputum for AFB is negative Currently asymptomatic.  CT reviewed from earlier this month with stable findings   Plan/Recommendations: - Chlorphentermine PRN, Flonase - Prilosec - Continue Arneta Cliche MD [facility] 09/17/2021, 9:34 AM  CC: Seward Tulani, MD

## 2021-10-11 ENCOUNTER — Telehealth: Payer: Self-pay

## 2021-10-11 NOTE — Telephone Encounter (Signed)
Have her come in at 1:00 and I will take care of her by 1:30

## 2021-10-12 ENCOUNTER — Ambulatory Visit: Payer: Medicare Other | Admitting: Podiatry

## 2021-10-12 ENCOUNTER — Encounter: Payer: Self-pay | Admitting: Podiatry

## 2021-10-12 DIAGNOSIS — L03031 Cellulitis of right toe: Secondary | ICD-10-CM | POA: Diagnosis not present

## 2021-10-12 NOTE — Patient Instructions (Signed)

## 2021-10-15 ENCOUNTER — Telehealth: Payer: Self-pay | Admitting: *Deleted

## 2021-10-15 NOTE — Telephone Encounter (Signed)
Patient is calling to ask how long to continue to soak the toe after nail procedure and if she should be using an ointment.  Spoke with patient and explained that she should soak the toe twice daily (20 mins.) until no more drainage is noticed , to contact if there are any signs of infection, redness, pus, streaking going up into the toe or foot.

## 2021-10-15 NOTE — Progress Notes (Signed)
Subjective:   Patient ID: Diana Cisneros, female   DOB: 76 y.o.   MRN: [id number]   HPI Patient presents stating that she irritated her right second digit on the lateral side and that it has been sore and red.  States that its not as bad as it was but it still very sore   ROS      Objective:  Physical Exam  Neurovascular status intact with a erythematous right second digit lateral side local with no spread to the inner phalangeal joint or proximal to this point     Assessment:  Paronychia of the right second digit lateral border     Plan:  H&P reviewed condition and discussed removal of the nail border flushing it out and I anesthetized 60 mg like Marcaine mixture accomplish this procedure with sterile instrumentation sterile conditions and flush the area out with it found to be local and it should heal uneventfully with strict instructions if any further erythema edema or pain were to recur I want to see her back

## 2021-10-16 NOTE — Progress Notes (Deleted)
76 y.o. G0P0 Married White or Caucasian Not Hispanic or Latino female here for annual exam.      No LMP recorded. Patient has had a hysterectomy.          Sexually active: {yes no:314532}  The current method of family planning is {contraception:315051}.    Exercising: {yes no:314532}  {types:19826} Smoker:  {YES NO:22349}  Health Maintenance: Pap:  *** History of abnormal Pap:  {YES NO:22349} MMG:  09/03/21 bi-rads  neg  BMD:   11/28/20 osteopenia  Colonoscopy: 12/27/14 TDaP:  08/08/17  Gardasil: n/a   reports that she quit smoking about 58 years ago. Her smoking use included cigarettes. She has never used smokeless tobacco. She reports current alcohol use of about 2.0 standard drinks of alcohol per week. She reports that she does not use drugs.  Past Medical History:  Diagnosis Date   Allergic rhinitis    Anxiety    Anxiousness    Degenerative disc disease    thoracic,lumbar   Hypercholesterolemia    Hyperlipidemia    Low back pain    Moderate persistent asthma without complication 09/14/5629   Muscle cramps    Nail dystrophy 12/30/2018   Osteopenia 10/2018   T score -2.2 distal third of forearm.  -1.6 left femoral neck FRAX 18% / 6.7%   Seasonal allergies    Sinusitis, acute    URI (upper respiratory infection)     Past Surgical History:  Procedure Laterality Date   ABDOMINAL HYSTERECTOMY  1986   TAH  LSO   APPENDECTOMY  2009   BREAST EXCISIONAL BIOPSY Left 1995   BREAST SURGERY     Biopsy benign   COLONOSCOPY WITH PROPOFOL N/A 12/27/2014   Procedure: COLONOSCOPY WITH PROPOFOL;  Surgeon: Garlan Fair, MD;  Location: [facility];  Service: Endoscopy;  Laterality: N/A;   KNEE SURGERY     arthroscopic   OOPHORECTOMY     LSO 86,RSO 95   PELVIC LAPAROSCOPY  1995   RSO    Current Outpatient Medications  Medication Sig Dispense Refill   alendronate (FOSAMAX) 70 MG tablet Take 1 tablet (70 mg total) by mouth every 7 (seven) days. Take first thing in am with 6 oz.  Water.  Be upright after taking.  Eat nothing for one hour. (Patient not taking: Reported on 04/16/2021) 12 tablet 3   Ascorbic Acid (VITAMIN C) 100 MG tablet Take 100 mg by mouth daily.       augmented betamethasone dipropionate (DIPROLENE-AF) 0.05 % cream APPLY TO AFFECTED AREA AS NEEDED 50 g 0   BREO ELLIPTA 200-25 MCG/ACT AEPB INHALE 1 PUFF BY MOUTH EVERY DAY 60 each 5   Calcium Carbonate+Vitamin D 600-200 MG-UNIT TABS 1 tablet     Calcium Carbonate-Vitamin D (CALCIUM + D PO) Take 1 tablet by mouth daily.      chlorpheniramine (CHLOR-TRIMETON) 4 MG tablet Take 4 mg by mouth 2 (two) times daily as needed for allergies.     diazepam (VALIUM) 5 MG tablet Take 5 mg by mouth daily as needed.  3   diclofenac sodium (VOLTAREN) 1 % GEL APPLY 2 4 GRAMS 3 TO 4 TIMES DAILY     fluticasone (CUTIVATE) 0.005 % ointment SMARTSIG:Sparingly Topical Twice Daily     gabapentin (NEURONTIN) 100 MG capsule Take 100-300 mg by mouth at bedtime as needed (pain or insomnia).     Glucosamine-Chondroit-Vit C-Mn (GLUCOSAMINE CHONDR 1500 COMPLX PO) See admin instructions.     glucosamine-chondroitin 500-400 MG tablet Take 1 tablet  by mouth daily.       Magnesium 300 MG CAPS See admin instructions.     meloxicam (MOBIC) 15 MG tablet Take 7.5 mg by mouth daily.  2   methocarbamol (ROBAXIN) 750 MG tablet Take 750 mg by mouth every 8 (eight) hours as needed.     Misc Natural Products (OSTEO BI-FLEX JOINT SHIELD PO) See admin instructions.     ondansetron (ZOFRAN) 4 MG tablet Take 1 tablet (4 mg total) by mouth every 8 (eight) hours as needed for nausea or vomiting. 20 tablet 0   pantoprazole (PROTONIX) 40 MG tablet Take 1 tablet (40 mg total) by mouth daily. 7 tablet 0   Potassium 75 MG TABS See admin instructions.     rosuvastatin (CRESTOR) 20 MG tablet Take 1 tablet (20 mg total) by mouth daily. 30 tablet 11   Specialty Vitamins Products (MAGNESIUM, AMINO ACID CHELATE,) 133 MG tablet Take 1 tablet by mouth daily.        SUPER B COMPLEX/C PO See admin instructions.     traMADol (ULTRAM) 50 MG tablet TAKE 1 TO 2 TABLETS BY MOUTH 2 TO 3 TIMES DAILY AS NEEDED     vitamin B-12 (CYANOCOBALAMIN) 100 MCG tablet 1 tablet     VITAMIN D, CHOLECALCIFEROL, PO Take 1 tablet by mouth daily.      No current facility-administered medications for this visit.    Family History  Problem Relation Age of Onset   Heart disease Mother    Stroke Father    Diabetes Maternal Grandmother    Heart disease Maternal Grandmother    Diabetes Maternal Grandfather    Cancer Maternal Grandfather        Colon   Breast cancer Paternal Grandmother        Age 16    Review of Systems  Exam:   There were no vitals taken for this visit.  Weight change: '@WEIGHTCHANGE'$ @ Height:      Ht Readings from Last 3 Encounters:  09/17/21 '5\' 2"'$  (1.575 m)  02/13/21 '5\' 2"'$  (1.575 m)  12/25/20 '5\' 2"'$  (1.575 m)    General appearance: alert, cooperative and appears stated age Head: Normocephalic, without obvious abnormality, atraumatic Neck: no adenopathy, supple, symmetrical, trachea midline and thyroid {CHL AMB PHY EX THYROID NORM [phone]::"normal to inspection and palpation"} Lungs: clear to auscultation bilaterally Cardiovascular: regular rate and rhythm Breasts: {Exam; breast:13139::"normal appearance, no masses or tenderness"} Abdomen: soft, non-tender; non distended,  no masses,  no organomegaly Extremities: extremities normal, atraumatic, no cyanosis or edema Skin: Skin color, texture, turgor normal. No rashes or lesions Lymph nodes: Cervical, supraclavicular, and axillary nodes normal. No abnormal inguinal nodes palpated Neurologic: Grossly normal   Pelvic: External genitalia:  no lesions              Urethra:  normal appearing urethra with no masses, tenderness or lesions              Bartholins and Skenes: normal                 Vagina: normal appearing vagina with normal color and discharge, no lesions              Cervix:  {CHL AMB PHY EX CERVIX NORM DEFAULT:[phone]::"no lesions"}               Bimanual Exam:  Uterus:  {CHL AMB PHY EX UTERUS NORM [phone]::"normal size, contour, position, consistency, mobility, non-tender"}  Adnexa: {CHL AMB PHY EX ADNEXA NO MASS DEFAULT:[phone]::"no mass, fullness, tenderness"}               Rectovaginal: Confirms               Anus:  normal sphincter tone, no lesions  *** chaperoned for the exam.  A:  Well Woman with normal exam  P:

## 2021-10-24 ENCOUNTER — Ambulatory Visit: Payer: Medicare Other | Admitting: Obstetrics and Gynecology

## 2021-11-15 DIAGNOSIS — L111 Transient acantholytic dermatosis [Grover]: Secondary | ICD-10-CM | POA: Diagnosis not present

## 2021-11-27 ENCOUNTER — Other Ambulatory Visit: Payer: Medicare Other

## 2021-12-03 ENCOUNTER — Other Ambulatory Visit: Payer: Self-pay | Admitting: Obstetrics and Gynecology

## 2021-12-03 DIAGNOSIS — M810 Age-related osteoporosis without current pathological fracture: Secondary | ICD-10-CM

## 2021-12-03 NOTE — Telephone Encounter (Signed)
Last AEX 10/18/2020--nothing scheduled at the moment.  Pt was scheduled for annual on 10/24/2021. However, pt was notified of medicare coverage q other year so visit was cancelled and breast and pelvic was scheduled for 10/23/2022. However, upon my search of pt's sexual hx. 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 I have spoken with DM regarding this and she will call and speak with pt and make her an appt.   Last DEXA-11/28/2020-osteopenia (T-score -2.4)

## 2021-12-07 ENCOUNTER — Encounter: Payer: Self-pay | Admitting: Podiatry

## 2021-12-07 ENCOUNTER — Ambulatory Visit: Payer: Medicare Other | Admitting: Podiatry

## 2021-12-07 DIAGNOSIS — Q828 Other specified congenital malformations of skin: Secondary | ICD-10-CM

## 2021-12-07 DIAGNOSIS — M79671 Pain in right foot: Secondary | ICD-10-CM

## 2021-12-07 DIAGNOSIS — L84 Corns and callosities: Secondary | ICD-10-CM | POA: Diagnosis not present

## 2021-12-07 DIAGNOSIS — M79674 Pain in right toe(s): Secondary | ICD-10-CM

## 2021-12-07 DIAGNOSIS — M79672 Pain in left foot: Secondary | ICD-10-CM | POA: Diagnosis not present

## 2021-12-07 DIAGNOSIS — B351 Tinea unguium: Secondary | ICD-10-CM

## 2021-12-07 DIAGNOSIS — M79675 Pain in left toe(s): Secondary | ICD-10-CM | POA: Diagnosis not present

## 2021-12-07 NOTE — Progress Notes (Unsigned)
  Subjective:  Patient ID: Diana Cisneros, female    DOB: 03/13/1945,  MRN: [id number]  Diana Cisneros presents to clinic today for {jgcomplaint:23593}  Chief Complaint  Patient presents with   Nail Problem    Thick painful toenails, 3 month follow up   New problem(s): None. {jgcomplaint:23593}  PCP is Seward Julie-Ann, MD , and last visit was {Time; dates multiple:15870}.  Allergies  Allergen Reactions   Morphine And Related Hives   Morphine Sulfate     Other reaction(s): itching/redface   Other     Other reaction(s): rash/lips swelling   Naproxen Sodium Rash    Review of Systems: Negative except as noted in the HPI.  Objective: No changes noted in today's physical examination.  Diana Cisneros is a pleasant 76 y.o. female WD, WN in NAD. AAO x 3. Vascular CFT immediate b/l LE. Palpable DP/PT pulses b/l LE. Digital hair present b/l. Skin temperature gradient WNL b/l. No pain with calf compression b/l. No edema noted b/l. No cyanosis or clubbing noted b/l LE.  Neurologic Normal speech. Oriented to person, place, and time. Protective sensation intact 5/5 intact bilaterally with 10g monofilament b/l. Vibratory sensation intact b/l. Proprioception intact bilaterally.  Dermatologic Pedal integument with normal turgor, texture and tone BLE. No open wounds b/l LE. No interdigital macerations noted b/l LE. Toenails 1-5 b/l are painful, elongated, discolored, dystrophic with subungual debris. Pain with dorsal palpation of nailplates. No erythema, no edema, no drainage noted.   Hyperkeratotic lesion(s) bilateral great toes and bilateral 2nd toes.  No erythema, no edema, no drainage, no fluctuance. Porokeratotic lesion(s) submet head 2 b/l. No erythema, no edema, no drainage, no fluctuance.  Orthopedic: Normal muscle strength 5/5 to all lower extremity muscle groups bilaterally. HAV with bunion deformity noted b/l LE. Hammertoe(s) noted to the bilateral 5th toes.. No pain, crepitus or joint limitation  noted with ROM b/l LE.  Patient ambulates independently without assistive aids.   Radiographs: None  Assessment/Plan: No diagnosis found.  No orders of the defined types were placed in this encounter.   {Jgplan:23602::"-Patient/POA to call should there be question/concern in the interim."}   Return in about 3 months (around 03/09/2022).  Marzetta Board, DPM

## 2021-12-07 NOTE — Telephone Encounter (Signed)
Pt scheduled for 01/17/2022.

## 2021-12-15 ENCOUNTER — Other Ambulatory Visit: Payer: Self-pay | Admitting: Pulmonary Disease

## 2021-12-17 ENCOUNTER — Ambulatory Visit: Payer: Medicare Other | Admitting: Podiatry

## 2022-01-07 ENCOUNTER — Ambulatory Visit: Payer: Medicare Other | Attending: Cardiovascular Disease

## 2022-01-07 DIAGNOSIS — E782 Mixed hyperlipidemia: Secondary | ICD-10-CM

## 2022-01-08 ENCOUNTER — Telehealth: Payer: Self-pay | Admitting: Pharmacist

## 2022-01-08 DIAGNOSIS — E7800 Pure hypercholesterolemia, unspecified: Secondary | ICD-10-CM

## 2022-01-08 LAB — LIPID PANEL
Chol/HDL Ratio: 3 ratio (ref 0.0–4.4)
Cholesterol, Total: 200 mg/dL — ABNORMAL HIGH (ref 100–199)
HDL: 66 mg/dL (ref >39)
LDL Chol Calc (NIH): 121 mg/dL — ABNORMAL HIGH (ref 0–99)
Triglycerides: 70 mg/dL (ref 0–149)
VLDL Cholesterol Cal: 13 mg/dL (ref 5–40)

## 2022-01-08 LAB — APOLIPOPROTEIN B: Apolipoprotein B: 90 mg/dL — ABNORMAL HIGH (ref <90)

## 2022-01-08 NOTE — Telephone Encounter (Signed)
Patient still taking fluvastatin as she found more bottles. She has 18 days left and then is willing to switch to rosuvastatin. Recheck labs 3/4.

## 2022-01-08 NOTE — Addendum Note (Signed)
Addended by: Marcelle Overlie D on: 01/08/2022 04:37 PM   Modules accepted: Orders

## 2022-01-10 DIAGNOSIS — E7800 Pure hypercholesterolemia, unspecified: Secondary | ICD-10-CM | POA: Diagnosis not present

## 2022-01-10 DIAGNOSIS — Z0000 Encounter for general adult medical examination without abnormal findings: Secondary | ICD-10-CM | POA: Diagnosis not present

## 2022-01-10 DIAGNOSIS — Z5181 Encounter for therapeutic drug level monitoring: Secondary | ICD-10-CM | POA: Diagnosis not present

## 2022-01-10 DIAGNOSIS — I700 Atherosclerosis of aorta: Secondary | ICD-10-CM | POA: Diagnosis not present

## 2022-01-10 DIAGNOSIS — M810 Age-related osteoporosis without current pathological fracture: Secondary | ICD-10-CM | POA: Diagnosis not present

## 2022-01-10 DIAGNOSIS — Z23 Encounter for immunization: Secondary | ICD-10-CM | POA: Diagnosis not present

## 2022-01-16 ENCOUNTER — Telehealth: Payer: Self-pay | Admitting: Pulmonary Disease

## 2022-01-16 MED ORDER — PAXLOVID (300/100) 20 X 150 MG & 10 X 100MG PO TBPK: 3.0000 | ORAL_TABLET | Freq: Two times a day (BID) | ORAL | 0 refills | Status: DC

## 2022-01-16 NOTE — Telephone Encounter (Signed)
PT calling due to pos Covid test. Has quest about continuing Breo or not to and is their any spec RX for Covid or not. Please call to advise @ [phone]

## 2022-01-16 NOTE — Telephone Encounter (Signed)
I have sent a prescription for paxlovid to her pharmacy. It is ok to use it with meloxicam and please tell her to continue the breo inhaler.

## 2022-01-16 NOTE — Telephone Encounter (Signed)
Called and spoke with patient advised her of Dr. Matilde Bash recc. Pt verbalized understanding. Nothing further needed

## 2022-01-16 NOTE — Telephone Encounter (Signed)
Called patient and she states that she has tested Positive for covid last night. Only sign is runny nose and some soreness in her throat. Pt states that it started Monday.   Pt is wanting to know if she needs to continue Breo given she is covid positive.   No OTC medications  Pt states that she was unsure if she should take anything given she is already on Meloxicam.   Please advise sir

## 2022-01-17 ENCOUNTER — Ambulatory Visit: Payer: Medicare Other | Admitting: Obstetrics and Gynecology

## 2022-01-27 ENCOUNTER — Telehealth: Payer: Self-pay | Admitting: Pulmonary Disease

## 2022-01-27 NOTE — Telephone Encounter (Signed)
PCCM:  Called spoke with the patient regarding positive COVID test at home.  She just tested positive about 2 weeks ago at home.  She was still having symptoms so she retested and tested positive again.  I explained that she was no need for her to continue to test as she potentially could remain positive for several weeks after having COVID with an antigen test.  Garner Nash, DO [facility] 01/27/2022 8:17 AM

## 2022-01-31 NOTE — Progress Notes (Signed)
77 y.o. G0P0 Married White or Caucasian Not Hispanic or Latino female here for annual exam.  H/O hysterectomy. Not sexually active.    She started on Fosamax in 1/23, diagnosed with osteoporosis in 11/22. She is tolerating the fosamax okay. She exercises, she gets calcium and vit d.   Sometimes needs strain to urinate, worse at night. Most of the time her stream is normal. No vaginal bulge. She is up to void 2-3 x a night to void, it has always been like this.  Urinary frequency, no change.   No bowel issues.   No LMP recorded. Patient has had a hysterectomy.          Sexually active: No.  The current method of family planning is status post hysterectomy.    Exercising: Yes.    , walking daily (over 14,000 steps a day).  Smoker:  no  Health Maintenance: Pap:  05/10/10 WNL  History of abnormal Pap:  no MMG:  09/04/21 density B Bi-rads 1 neg  BMD:   11/28/20 T score -2.4, Frax 20/11% Colonoscopy: yes ~5-6 years ago. She was told she did not need any more.  TDaP:  08/08/17  Gardasil: none    reports that she quit smoking about 59 years ago. Her smoking use included cigarettes. She has never used smokeless tobacco. She reports current alcohol use of about 2.0 standard drinks of alcohol per week. She reports that she does not use drugs.  Past Medical History:  Diagnosis Date   Allergic rhinitis    Anxiety    Anxiousness    Degenerative disc disease    thoracic,lumbar   Hypercholesterolemia    Hyperlipidemia    Low back pain    Moderate persistent asthma without complication 2/45/8099   Muscle cramps    Nail dystrophy 12/30/2018   Osteopenia 10/2018   T score -2.2 distal third of forearm.  -1.6 left femoral neck FRAX 18% / 6.7%   Seasonal allergies    Sinusitis, acute    URI (upper respiratory infection)     Past Surgical History:  Procedure Laterality Date   ABDOMINAL HYSTERECTOMY  1986   TAH  LSO   APPENDECTOMY  2009   BREAST EXCISIONAL BIOPSY Left 1995   BREAST SURGERY      Biopsy benign   COLONOSCOPY WITH PROPOFOL N/A 12/27/2014   Procedure: COLONOSCOPY WITH PROPOFOL;  Surgeon: Garlan Fair, MD;  Location: [facility];  Service: Endoscopy;  Laterality: N/A;   KNEE SURGERY     arthroscopic   OOPHORECTOMY     LSO 86,RSO 95   PELVIC LAPAROSCOPY  1995   RSO    Current Outpatient Medications  Medication Sig Dispense Refill   alendronate (FOSAMAX) 70 MG tablet TAKE 1 TABLET (70 MG TOTAL) BY MOUTH EVERY 7 (SEVEN) DAYS. TAKE FIRST THING IN AM WITH 6 OZ. WATER. BE UPRIGHT AFTER TAKING. EAT NOTHING FOR ONE HOUR. 12 tablet 0   Ascorbic Acid (VITAMIN C) 100 MG tablet Take 100 mg by mouth daily.       augmented betamethasone dipropionate (DIPROLENE-AF) 0.05 % cream APPLY TO AFFECTED AREA AS NEEDED 50 g 0   BREO ELLIPTA 200-25 MCG/ACT AEPB INHALE 1 PUFF BY MOUTH EVERY DAY 60 each 5   Calcium Carbonate+Vitamin D 600-200 MG-UNIT TABS 1 tablet     Calcium Carbonate-Vitamin D (CALCIUM + D PO) Take 1 tablet by mouth daily.      chlorpheniramine (CHLOR-TRIMETON) 4 MG tablet Take 4 mg by mouth 2 (two) times  daily as needed for allergies.     diazepam (VALIUM) 5 MG tablet Take 5 mg by mouth daily as needed.  3   diclofenac sodium (VOLTAREN) 1 % GEL APPLY 2 4 GRAMS 3 TO 4 TIMES DAILY     fluticasone (CUTIVATE) 0.005 % ointment SMARTSIG:Sparingly Topical Twice Daily     gabapentin (NEURONTIN) 100 MG capsule Take 100-300 mg by mouth at bedtime as needed (pain or insomnia).     Glucosamine-Chondroit-Vit C-Mn (GLUCOSAMINE CHONDR 1500 COMPLX PO) See admin instructions.     glucosamine-chondroitin 500-400 MG tablet Take 1 tablet by mouth daily.       Magnesium 300 MG CAPS See admin instructions.     meloxicam (MOBIC) 15 MG tablet Take 7.5 mg by mouth daily.  2   methocarbamol (ROBAXIN) 750 MG tablet Take 750 mg by mouth every 8 (eight) hours as needed.     Misc Natural Products (OSTEO BI-FLEX JOINT SHIELD PO) See admin instructions.     ondansetron (ZOFRAN) 4 MG tablet Take 1  tablet (4 mg total) by mouth every 8 (eight) hours as needed for nausea or vomiting. 20 tablet 0   pantoprazole (PROTONIX) 40 MG tablet Take 1 tablet (40 mg total) by mouth daily. 7 tablet 0   Potassium 75 MG TABS See admin instructions.     rosuvastatin (CRESTOR) 20 MG tablet Take 1 tablet (20 mg total) by mouth daily. 30 tablet 11   Specialty Vitamins Products (MAGNESIUM, AMINO ACID CHELATE,) 133 MG tablet Take 1 tablet by mouth daily.       SUPER B COMPLEX/C PO See admin instructions.     traMADol (ULTRAM) 50 MG tablet TAKE 1 TO 2 TABLETS BY MOUTH 2 TO 3 TIMES DAILY AS NEEDED     vitamin B-12 (CYANOCOBALAMIN) 100 MCG tablet 1 tablet     VITAMIN D, CHOLECALCIFEROL, PO Take 1 tablet by mouth daily.      nirmatrelvir & ritonavir (PAXLOVID, 300/100,) 20 x 150 MG & 10 x '100MG'$  TBPK Take 3 tablets by mouth 2 (two) times daily. 300 mg nirmatrelvir (two 150 mg tablets) and 100 mg ritonavir (one 100 mg tablet) with all 3 tablets taken together by mouth twice daily for 5 days. (Patient not taking: Reported on 02/12/2022) 10 tablet 0   No current facility-administered medications for this visit.    Family History  Problem Relation Age of Onset   Heart disease Mother    Stroke Father    Diabetes Maternal Grandmother    Heart disease Maternal Grandmother    Diabetes Maternal Grandfather    Cancer Maternal Grandfather        Colon   Breast cancer Paternal Grandmother        Age 79    Review of Systems  All other systems reviewed and are negative.   Exam:   BP (!) 160/80 (BP Location: Right Arm, Patient Position: Sitting, Cuff Size: Normal)   Pulse 74   Ht '5\' 2"'$  (1.575 m) Comment: pt declined to remove shoes  Wt 122 lb (55.3 kg)   BMI 22.31 kg/m   Weight change: '@WEIGHTCHANGE'$ @ Height:   Height: '5\' 2"'$  (157.5 cm) (pt declined to remove shoes)  Ht Readings from Last 3 Encounters:  02/12/22 '5\' 2"'$  (1.575 m)  09/17/21 '5\' 2"'$  (1.575 m)  02/13/21 '5\' 2"'$  (1.575 m)    General appearance: alert,  cooperative and appears stated age Head: Normocephalic, without obvious abnormality, atraumatic Neck: no adenopathy, supple, symmetrical, trachea midline and thyroid normal  to inspection and palpation Breasts: normal appearance, no masses or tenderness Abdomen: soft, non-tender; non distended,  no masses,  no organomegaly Extremities: extremities normal, atraumatic, no cyanosis or edema Skin: Skin color, texture, turgor normal. No rashes or lesions Lymph nodes: Cervical, supraclavicular, and axillary nodes normal. No abnormal inguinal nodes palpated Neurologic: Grossly normal   Pelvic: External genitalia:  no lesions              Urethra:  normal appearing urethra with no masses, tenderness or lesions              Bartholins and Skenes: normal                 Vagina: normal appearing vagina with normal color and discharge, no lesions              Cervix: absent               Bimanual Exam:  Uterus:  uterus absent              Adnexa: no mass, fullness, tenderness               Rectovaginal: Confirms               Anus:  normal sphincter tone, no lesions  Kimalexis, RMA chaperoned for the exam.  1. Encounter for breast and pelvic examination Discussed breast self exam Discussed calcium and vit D intake Labs with primary No pap needed Mammogram due in 8/24 No further colonoscopies  2. Age-related osteoporosis without current pathological fracture - DG Bone Density; Future, 11/24 - alendronate (FOSAMAX) 70 MG tablet; Take 1 tablet (70 mg total) by mouth every 7 (seven) days. Take first thing in am with 6 oz. Water.  Be upright after taking.  Eat nothing for one hour.  Dispense: 12 tablet; Refill: 3  3. Elevated BP without diagnosis of hypertension Will repeat her BP, if still high she will f/u with her primary.

## 2022-02-12 ENCOUNTER — Ambulatory Visit (INDEPENDENT_AMBULATORY_CARE_PROVIDER_SITE_OTHER): Payer: Medicare Other | Admitting: Obstetrics and Gynecology

## 2022-02-12 ENCOUNTER — Encounter: Payer: Self-pay | Admitting: Obstetrics and Gynecology

## 2022-02-12 VITALS — BP 160/80 | HR 74 | Ht 62.0 in | Wt 122.0 lb

## 2022-02-12 DIAGNOSIS — Z9189 Other specified personal risk factors, not elsewhere classified: Secondary | ICD-10-CM

## 2022-02-12 DIAGNOSIS — M810 Age-related osteoporosis without current pathological fracture: Secondary | ICD-10-CM

## 2022-02-12 DIAGNOSIS — R030 Elevated blood-pressure reading, without diagnosis of hypertension: Secondary | ICD-10-CM | POA: Diagnosis not present

## 2022-02-12 DIAGNOSIS — Z01419 Encounter for gynecological examination (general) (routine) without abnormal findings: Secondary | ICD-10-CM

## 2022-02-12 MED ORDER — ALENDRONATE SODIUM 70 MG PO TABS: 70.0000 mg | ORAL_TABLET | ORAL | 3 refills | Status: AC

## 2022-02-12 NOTE — Patient Instructions (Signed)

## 2022-02-26 ENCOUNTER — Other Ambulatory Visit: Payer: Self-pay | Admitting: Obstetrics and Gynecology

## 2022-02-26 DIAGNOSIS — M810 Age-related osteoporosis without current pathological fracture: Secondary | ICD-10-CM

## 2022-02-28 NOTE — Telephone Encounter (Signed)
RF request received for Fosamax '70mg'$ .  AEX 02/12/22 with Dr. Talbert Nan.  RX was sent on 02/12/22 with refills x's 1 year.  VM left for patient to call back to see if she already picked up her medication or if we needed to resend RX to her pharmacy

## 2022-03-18 ENCOUNTER — Ambulatory Visit: Payer: Medicare Other | Admitting: Pulmonary Disease

## 2022-03-18 ENCOUNTER — Ambulatory Visit (INDEPENDENT_AMBULATORY_CARE_PROVIDER_SITE_OTHER): Payer: Medicare Other

## 2022-03-18 ENCOUNTER — Encounter: Payer: Self-pay | Admitting: Pulmonary Disease

## 2022-03-18 ENCOUNTER — Ambulatory Visit: Payer: Medicare Other | Admitting: Podiatry

## 2022-03-18 ENCOUNTER — Encounter: Payer: Self-pay | Admitting: Podiatry

## 2022-03-18 VITALS — BP 146/80 | HR 78 | Temp 98.2°F | Ht 62.0 in | Wt 123.0 lb

## 2022-03-18 VITALS — BP 185/92

## 2022-03-18 DIAGNOSIS — J4540 Moderate persistent asthma, uncomplicated: Secondary | ICD-10-CM

## 2022-03-18 DIAGNOSIS — R059 Cough, unspecified: Secondary | ICD-10-CM

## 2022-03-18 DIAGNOSIS — M79672 Pain in left foot: Secondary | ICD-10-CM | POA: Diagnosis not present

## 2022-03-18 DIAGNOSIS — R053 Chronic cough: Secondary | ICD-10-CM | POA: Diagnosis not present

## 2022-03-18 DIAGNOSIS — M79674 Pain in right toe(s): Secondary | ICD-10-CM

## 2022-03-18 DIAGNOSIS — Q828 Other specified congenital malformations of skin: Secondary | ICD-10-CM

## 2022-03-18 DIAGNOSIS — M79671 Pain in right foot: Secondary | ICD-10-CM

## 2022-03-18 DIAGNOSIS — B351 Tinea unguium: Secondary | ICD-10-CM

## 2022-03-18 DIAGNOSIS — M79675 Pain in left toe(s): Secondary | ICD-10-CM | POA: Diagnosis not present

## 2022-03-18 NOTE — Progress Notes (Signed)
Subjective:  Patient ID: Diana Cisneros, female    DOB: [birth year],  MRN: [id number]  Diana Cisneros presents to clinic today for painful porokeratotic lesion(s) both feet and painful mycotic toenails that limit ambulation. Painful toenails interfere with ambulation. Aggravating factors include wearing enclosed shoe gear. Pain is relieved with periodic professional debridement. Painful porokeratotic lesions are aggravated when weightbearing with and without shoegear. Pain is relieved with periodic professional debridement.  Chief Complaint  Patient presents with   Nail Problem    RFC PCP-Polite PCP VST-Couple of months ago"   New problem(s): Patient is starting to have pain on distal tip of left 2nd toe. She notices toe is more contracted. She recently started hypertension medication, losartan.   PCP is Seward Andreea, MD.  Allergies  Allergen Reactions   Morphine And Related Hives   Morphine Sulfate     Other reaction(s): itching/redface   Other     Other reaction(s): rash/lips swelling   Naproxen Sodium Rash    Review of Systems: Negative except as noted in the HPI.  Objective: No changes noted in today's physical examination. Vitals:   03/18/22 0958 03/18/22 1009  BP: (!) 189/97 (!) 185/92   Diana Cisneros is a pleasant 77 y.o. female WD, WN in NAD. AAO x 3. Vascular CFT immediate b/l LE. Palpable DP/PT pulses b/l LE. Digital hair present b/l. Skin temperature gradient WNL b/l. No pain with calf compression b/l. No edema noted b/l. No cyanosis or clubbing noted b/l LE.  Neurologic Normal speech. Oriented to person, place, and time. Protective sensation intact 5/5 intact bilaterally with 10g monofilament b/l. Vibratory sensation intact b/l. Proprioception intact bilaterally.  Dermatologic Pedal integument with normal turgor, texture and tone BLE. No open wounds b/l LE. No interdigital macerations noted b/l LE.   Evidence of partial nail avulsion right 2nd toe healed at procedure  site.  Toenails 1-5 b/l are painful, elongated, discolored, dystrophic with subungual debris. Pain with dorsal palpation of nailplates. No erythema, no edema, no drainage noted.   Hyperkeratotic lesion(s) bilateral great toes and bilateral 2nd toes.  No erythema, no edema, no drainage, no fluctuance.   Porokeratotic lesion(s) submet head 2 b/l. No erythema, no edema, no drainage, no fluctuance.  Orthopedic: Normal muscle strength 5/5 to all lower extremity muscle groups bilaterally. HAV with bunion deformity noted b/l LE. Hammertoe(s) noted to the bilateral 5th toes with contracture noted b/l 2nd digits with no distal hyperkeratosis. No pain, crepitus or joint limitation noted with ROM b/l LE.  Patient ambulates independently without assistive aids.   Radiographs: None   Assessment/Plan: 1. Pain due to onychomycosis of toenails of both feet   2. Porokeratosis   3. Pain in both feet     -Patient was evaluated and treated. All patient's and/or POA's questions/concerns answered on today's visit. -Discussed elevated blood pressure reading with patient. She is asymptomatic today. Patient advised to discuss blood pressure with PCP. Patient/Family/Caregiver/POA related understanding. -Patient to continue soft, supportive shoe gear daily. -Toenails 1-5 b/l were debrided in length and girth with sterile nail nippers and dremel without iatrogenic bleeding.  -Porokeratotic lesion(s) submet head 2 b/l pared and enucleated with sterile currette without incident. Total number of lesions debrided=2. -Shoe recommendations given for Altra sneakers with wide toe box which may be purchased at [employer]. -Attempted to fit with toe crest, but it wouldn't stay on her foot. -Patient/POA to call should there be question/concern in the interim.   Return in about 3 months (around  06/16/2022).  Marzetta Board, DPM

## 2022-03-18 NOTE — Patient Instructions (Signed)
Will get a chest x-ray today Continue the inhalers as prescribed Follow-up in 6 months.

## 2022-03-18 NOTE — Progress Notes (Signed)
Diana Cisneros    CT:[id number]    [birth year]  Primary Care Physician:Polite, Jori Moll, MD  Referring Physician: Seward Anjela, MD [address] [place],  [place] [address]  Chief complaint: Follow-up for cough, asthma  HPI: 77 year old with history of allergic rhinitis, hyperlipidemia.  Complains of chronic cough for the past 1 to 2 months associated with clear to white mucus.  She has minimal dyspnea on exertion.  No symptoms at rest.  No wheezing, fevers, chills Cough is exacerbated on tilting her neck back and lying down.  She has history of allergic rhinitis, postnasal drip and takes loratadine at night.  No symptoms of GERD.  Pets: Has a dog and a cat. 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  No recent exposure.  No dampness, Jacuzzi, hot tub. Smoking history: Minimal smoking as a teenager. Travel history: Originally from [place].  Lived in [place].  No significant recent travel. Relevant family history: No significant family history of lung disease.  Interim history: Breathing is stable on the Breo inhaler which is helping.  Has mild head congestion and sinus fullness  Outpatient Encounter Medications as of 03/18/2022  Medication Sig   alendronate (FOSAMAX) 70 MG tablet Take 1 tablet (70 mg total) by mouth every 7 (seven) days. Take first thing in am with 6 oz. Water.  Be upright after taking.  Eat nothing for one hour.   Ascorbic Acid (VITAMIN C) 100 MG tablet Take 100 mg by mouth daily.     augmented betamethasone dipropionate (DIPROLENE-AF) 0.05 % cream APPLY TO AFFECTED AREA AS NEEDED   BREO ELLIPTA 200-25 MCG/ACT AEPB INHALE 1 PUFF BY MOUTH EVERY DAY   Calcium Carbonate+Vitamin D 600-200 MG-UNIT TABS 1 tablet   Calcium Carbonate-Vitamin D (CALCIUM + D PO) Take 1 tablet by mouth daily.    chlorpheniramine (CHLOR-TRIMETON) 4 MG tablet Take  4 mg by mouth 2 (two) times daily as needed for allergies.   diazepam (VALIUM) 5 MG tablet Take 5 mg by mouth daily as needed.   diclofenac sodium (VOLTAREN) 1 % GEL APPLY 2 4 GRAMS 3 TO 4 TIMES DAILY   fluticasone (CUTIVATE) 0.005 % ointment SMARTSIG:Sparingly Topical Twice Daily   gabapentin (NEURONTIN) 100 MG capsule Take 100-300 mg by mouth at bedtime as needed (pain or insomnia).   Glucosamine-Chondroit-Vit C-Mn (GLUCOSAMINE CHONDR 1500 COMPLX PO) See admin instructions.   glucosamine-chondroitin 500-400 MG tablet Take 1 tablet by mouth daily.     losartan (COZAAR) 25 MG tablet Take 25 mg by mouth daily.   Magnesium 300 MG CAPS See admin instructions.   meloxicam (MOBIC) 15 MG tablet Take 7.5 mg by mouth daily.   methocarbamol (ROBAXIN) 750 MG tablet Take 750 mg by mouth every 8 (eight) hours as needed.   Misc Natural Products (OSTEO BI-FLEX JOINT SHIELD PO) See admin instructions.   ondansetron (ZOFRAN) 4 MG tablet Take 1 tablet (4 mg total) by mouth every 8 (eight) hours as needed for nausea or vomiting.   pantoprazole (PROTONIX) 40 MG tablet Take 1 tablet (40 mg total) by mouth daily.   Potassium 75 MG TABS See admin instructions.   rosuvastatin (CRESTOR) 20 MG tablet Take 1 tablet (20 mg total) by mouth daily.   Specialty Vitamins Products (MAGNESIUM, AMINO ACID CHELATE,) 133 MG tablet Take 1 tablet by mouth daily.  SUPER B COMPLEX/C PO See admin instructions.   traMADol (ULTRAM) 50 MG tablet TAKE 1 TO 2 TABLETS BY MOUTH 2 TO 3 TIMES DAILY AS NEEDED   vitamin B-12 (CYANOCOBALAMIN) 100 MCG tablet 1 tablet   VITAMIN D, CHOLECALCIFEROL, PO Take 1 tablet by mouth daily.    [DISCONTINUED] nirmatrelvir & ritonavir (PAXLOVID, 300/100,) 20 x 150 MG & 10 x 100MG TBPK Take 3 tablets by mouth 2 (two) times daily. 300 mg nirmatrelvir (two 150 mg tablets) and 100 mg ritonavir (one 100 mg tablet) with all 3 tablets taken together by mouth twice daily for 5 days. (Patient not taking: Reported on  02/12/2022)   No facility-administered encounter medications on file as of 03/18/2022.   Physical Exam: Blood pressure (!) 142/70, pulse 64, temperature 97.8 F (36.6 C), temperature source Oral, height 5' 2"$  (1.575 m), weight 129 lb (58.5 kg), SpO2 98 %. Gen:      No acute distress HEENT:  EOMI, sclera anicteric Neck:     No masses; no thyromegaly Lungs:    Clear to auscultation bilaterally; normal respiratory effort CV:         Regular rate and rhythm; no murmurs Abd:      + bowel sounds; soft, non-tender; no palpable masses, no distension Ext:    No edema; adequate peripheral perfusion Skin:      Warm and dry; no rash Neuro: alert and oriented x 3 Psych: normal mood and affect   Data Reviewed: Imaging: CT chest 04/09/2007-visualized lung bases are normal. Chest x-ray 06/01/2015-clear lungs, no acute cardiopulmonary abnormality. Chest x-ray 10/29/2017- no active lung disease, chronic scarring the right middle lobe and lingula. Cardiac CT 05/31/2019-visualized lungs show mild bronchiectasis with tree-in-bud appearance. High-resolution CT 08/26/2019-mild areas of centrilobular nodularity, tree-in-bud with bronchial plugging. High-resolution CT 09/05/2021-stable areas of centrilobular nodularity, tree-in-bud I have reviewed the images personally.  PFTs: 11/24/2017 FVC 2.35 [89%), FEV1 1.53 [77%), F/F 65 post FEV1 1.90 [96%, +23%), TLC 115%, DLCO 18% Moderate obstruction with significant bronchodilator response and air trapping.  FENO 10/29/2017-18 FENO 11/24/2017-23  ACT score  04/09/2019-22 09/17/2021-24  Labs: CBC 10/29/2017-WBC 5.2, eos 2.8%, absolute eosinophil count 146 CBC 04/01/2019-WBC 5.4, eos 2.1%, absolute eosinophil count 113  Respiratory allergy profile allergy profile 10/29/2017-IgE 31, RAST panel-negative Respiratory allergy profile 04/09/2019-IgE 28, RAST panel negative  Sputum cultures 08/31/2019-Enterobacter, AFB is negative  Assessment:  Follow-up for chronic  cough Suspect upper airway cough from postnasal drip, possibly silent acid reflux Symptoms improved with chlorpheniramine, Prilosec  Has mild flareup of sinus congestion.  She will try steroid nasal spray and increase chlorpheniramine to twice daily  Asthma PFTs reviewed with obstruction and significant bronchodilator response No evidence elevated IgE or FENO, mild elevation in peripheral eosinophilia  Stable on Breo  Mild bronchiectasis Has mild bronchiectasis on CT scan with tree-in-bud.  Treated for Enterobacter in sputum in 2021.  Sputum for AFB is negative Currently asymptomatic.  CT reviewed from earlier this month with stable findings  Plan/Recommendations: - Chlorphentermine PRN, Flonase - Prilosec - Continue Arneta Cliche MD [facility] 03/18/2022, 1:18 PM  CC: Seward Alsie, MD

## 2022-03-18 NOTE — Progress Notes (Signed)
Diana Cisneros    OR:[id number]    02/25/1945  Primary Care Physician:Polite, Jori Moll, MD  Referring Physician: Seward Kamille, MD [address] [place],  [place] [address]  Chief complaint: Follow-up for cough, asthma  HPI: 77 y.o.  with history of allergic rhinitis, hyperlipidemia.  Complains of chronic cough for the past 1 to 2 months associated with clear to white mucus.  She has minimal dyspnea on exertion.  No symptoms at rest.  No wheezing, fevers, chills Cough is exacerbated on tilting her neck back and lying down.  She has history of allergic rhinitis, postnasal drip and takes loratadine at night.  No symptoms of GERD.  Pets: Has a dog and a cat. 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  No recent exposure.  No dampness, Jacuzzi, hot tub. Smoking history: Minimal smoking as a teenager. Travel history: Originally from [place] [employer] [place].  Lived in [place].  No significant recent travel. Relevant family history: No significant family history of lung disease.  Interim history: Developed COVID-19 in December 2023 just treated with Paxlovid.  She is feels much better but has persistent dry cough.  Outpatient Encounter Medications as of 03/18/2022  Medication Sig   alendronate (FOSAMAX) 70 MG tablet Take 1 tablet (70 mg total) by mouth every 7 (seven) days. Take first thing in am with 6 oz. Water.  Be upright after taking.  Eat nothing for one hour.   Ascorbic Acid (VITAMIN C) 100 MG tablet Take 100 mg by mouth daily.     augmented betamethasone dipropionate (DIPROLENE-AF) 0.05 % cream APPLY TO AFFECTED AREA AS NEEDED   BREO ELLIPTA 200-25 MCG/ACT AEPB INHALE 1 PUFF BY MOUTH EVERY DAY   Calcium Carbonate+Vitamin D 600-200 MG-UNIT TABS 1 tablet   Calcium Carbonate-Vitamin D (CALCIUM + D PO) Take 1 tablet by mouth daily.    chlorpheniramine (CHLOR-TRIMETON) 4  MG tablet Take 4 mg by mouth 2 (two) times daily as needed for allergies.   diazepam (VALIUM) 5 MG tablet Take 5 mg by mouth daily as needed.   diclofenac sodium (VOLTAREN) 1 % GEL APPLY 2 4 GRAMS 3 TO 4 TIMES DAILY   fluticasone (CUTIVATE) 0.005 % ointment SMARTSIG:Sparingly Topical Twice Daily   gabapentin (NEURONTIN) 100 MG capsule Take 100-300 mg by mouth at bedtime as needed (pain or insomnia).   Glucosamine-Chondroit-Vit C-Mn (GLUCOSAMINE CHONDR 1500 COMPLX PO) See admin instructions.   glucosamine-chondroitin 500-400 MG tablet Take 1 tablet by mouth daily.     losartan (COZAAR) 25 MG tablet Take 25 mg by mouth daily.   Magnesium 300 MG CAPS See admin instructions.   meloxicam (MOBIC) 15 MG tablet Take 7.5 mg by mouth daily.   methocarbamol (ROBAXIN) 750 MG tablet Take 750 mg by mouth every 8 (eight) hours as needed.   Misc Natural Products (OSTEO BI-FLEX JOINT SHIELD PO) See admin instructions.   ondansetron (ZOFRAN) 4 MG tablet Take 1 tablet (4 mg total) by mouth every 8 (eight) hours as needed for nausea or vomiting.   pantoprazole (PROTONIX) 40 MG tablet Take 1 tablet (40 mg total) by mouth daily.   Potassium 75 MG TABS See admin instructions.   rosuvastatin (CRESTOR) 20 MG tablet Take 1 tablet (20 mg total) by mouth daily.   Specialty Vitamins Products (MAGNESIUM, AMINO ACID CHELATE,) 133 MG tablet Take 1 tablet by mouth  daily.     SUPER B COMPLEX/C PO See admin instructions.   traMADol (ULTRAM) 50 MG tablet TAKE 1 TO 2 TABLETS BY MOUTH 2 TO 3 TIMES DAILY AS NEEDED   vitamin B-12 (CYANOCOBALAMIN) 100 MCG tablet 1 tablet   VITAMIN D, CHOLECALCIFEROL, PO Take 1 tablet by mouth daily.    [DISCONTINUED] nirmatrelvir & ritonavir (PAXLOVID, 300/100,) 20 x 150 MG & 10 x 100MG TBPK Take 3 tablets by mouth 2 (two) times daily. 300 mg nirmatrelvir (two 150 mg tablets) and 100 mg ritonavir (one 100 mg tablet) with all 3 tablets taken together by mouth twice daily for 5 days. (Patient not taking:  Reported on 02/12/2022)   No facility-administered encounter medications on file as of 03/18/2022.   Physical Exam: Blood pressure (!) 146/80, pulse 78, temperature 98.2 F (36.8 C), temperature source Oral, height 5' 2"$  (1.575 m), weight 123 lb (55.8 kg), SpO2 98 %. Gen:      No acute distress HEENT:  EOMI, sclera anicteric Neck:     No masses; no thyromegaly Lungs:    Clear to auscultation bilaterally; normal respiratory effort CV:         Regular rate and rhythm; no murmurs Abd:      + bowel sounds; soft, non-tender; no palpable masses, no distension Ext:    No edema; adequate peripheral perfusion Skin:      Warm and dry; no rash Neuro: alert and oriented x 3 Psych: normal mood and affect   Data Reviewed: Imaging: CT chest 04/09/2007-visualized lung bases are normal. Chest x-ray 06/01/2015-clear lungs, no acute cardiopulmonary abnormality. Chest x-ray 10/29/2017- no active lung disease, chronic scarring the right middle lobe and lingula. Cardiac CT 05/31/2019-visualized lungs show mild bronchiectasis with tree-in-bud appearance. High-resolution CT 08/26/2019-mild areas of centrilobular nodularity, tree-in-bud with bronchial plugging. High-resolution CT 09/05/2021-stable areas of centrilobular nodularity, tree-in-bud I have reviewed the images personally.  PFTs: 11/24/2017 FVC 2.35 [89%), FEV1 1.53 [77%), F/F 65 post FEV1 1.90 [96%, +23%), TLC 115%, DLCO 18% Moderate obstruction with significant bronchodilator response and air trapping.  FENO 10/29/2017-18 FENO 11/24/2017-23  ACT score  04/09/2019-22 09/17/2021-24  Labs: CBC 10/29/2017-WBC 5.2, eos 2.8%, absolute eosinophil count 146 CBC 04/01/2019-WBC 5.4, eos 2.1%, absolute eosinophil count 113  Respiratory allergy profile allergy profile 10/29/2017-IgE 31, RAST panel-negative Respiratory allergy profile 04/09/2019-IgE 28, RAST panel negative  Sputum cultures 08/31/2019-Enterobacter, AFB is negative  Assessment:  Post  COVID-19 Appears to have recovered well with Paxlovid.  She still has flareup of chronic cough after the infection.  Will get chest x-ray today to evaluate the lungs.  Follow-up for chronic cough Suspect upper airway cough from postnasal drip, possibly silent acid reflux Symptoms improved with chlorpheniramine, Prilosec  Wants to avoid nasal spray as she has episodes of epistaxis since childhood.  Asthma PFTs reviewed with obstruction and significant bronchodilator response No evidence elevated IgE or FENO, mild elevation in peripheral eosinophilia  Stable on Breo  Mild bronchiectasis Has mild bronchiectasis on CT scan with tree-in-bud.  Treated for Enterobacter in sputum in 2021.  Sputum for AFB is negative Currently asymptomatic.  CT reviewed from 2023   Plan/Recommendations: - Chest x-ray - Chlorphentermine PRN - Prilosec - Continue Arneta Cliche MD [facility] 03/18/2022, 1:17 PM  CC: Seward Rosette, MD

## 2022-03-20 DIAGNOSIS — I10 Essential (primary) hypertension: Secondary | ICD-10-CM | POA: Diagnosis not present

## 2022-04-01 ENCOUNTER — Ambulatory Visit: Payer: Medicare Other | Attending: Cardiovascular Disease

## 2022-04-01 DIAGNOSIS — E7800 Pure hypercholesterolemia, unspecified: Secondary | ICD-10-CM | POA: Diagnosis not present

## 2022-04-02 LAB — LIPID PANEL
Chol/HDL Ratio: 2.5 ratio (ref 0.0–4.4)
Cholesterol, Total: 197 mg/dL (ref 100–199)
HDL: 79 mg/dL (ref >39)
LDL Chol Calc (NIH): 108 mg/dL — ABNORMAL HIGH (ref 0–99)
Triglycerides: 56 mg/dL (ref 0–149)
VLDL Cholesterol Cal: 10 mg/dL (ref 5–40)

## 2022-04-02 LAB — HEPATIC FUNCTION PANEL
ALT: 30 IU/L (ref 0–32)
AST: 39 IU/L (ref 0–40)
Albumin: 4.4 g/dL (ref 3.8–4.8)
Alkaline Phosphatase: 59 IU/L (ref 44–121)
Bilirubin Total: 0.8 mg/dL (ref 0.0–1.2)
Bilirubin, Direct: 0.18 mg/dL (ref 0.00–0.40)
Total Protein: 6.7 g/dL (ref 6.0–8.5)

## 2022-04-02 LAB — APOLIPOPROTEIN B: Apolipoprotein B: 89 mg/dL (ref <90)

## 2022-04-03 ENCOUNTER — Telehealth: Payer: Self-pay | Admitting: Pharmacist

## 2022-04-03 DIAGNOSIS — E782 Mixed hyperlipidemia: Secondary | ICD-10-CM

## 2022-04-03 MED ORDER — EZETIMIBE 10 MG PO TABS: 10.0000 mg | ORAL_TABLET | Freq: Every day | ORAL | 3 refills | Status: DC

## 2022-04-03 NOTE — Telephone Encounter (Signed)
Spoke to patient about her labs. She is taking rosuvastatin '20mg'$  daily. LDL-C , non-HDL and apo B all above goal. Pt agreeable to adding ezetimibe. Follow up labs in 3 months.

## 2022-04-05 DIAGNOSIS — I10 Essential (primary) hypertension: Secondary | ICD-10-CM | POA: Diagnosis not present

## 2022-04-05 DIAGNOSIS — E7800 Pure hypercholesterolemia, unspecified: Secondary | ICD-10-CM | POA: Diagnosis not present

## 2022-04-12 ENCOUNTER — Telehealth: Payer: Self-pay | Admitting: Pulmonary Disease

## 2022-04-12 NOTE — Telephone Encounter (Signed)
Called and spoke with patient. Patient stated that she has drainage all the time and she's been coughing a lot. She stated that her cough is productive and that her chest is starting to hurt from all the coughing she has been doing. Patient also stated that she's been dealing with the cough and drainage for a week and the drainage was yellow and changed to brown and then clear.   SG, please advise.

## 2022-04-12 NOTE — Telephone Encounter (Signed)
Called and spoke with patient. Patient verbalized understanding. Nothing further needed.  

## 2022-04-12 NOTE — Telephone Encounter (Signed)
Pt. Calling in is sick calling about poss. Sinus infection symp. And needs advise on what to do

## 2022-04-16 ENCOUNTER — Encounter: Payer: Self-pay | Admitting: Pulmonary Disease

## 2022-04-16 ENCOUNTER — Ambulatory Visit: Payer: Medicare Other | Admitting: Pulmonary Disease

## 2022-04-16 VITALS — BP 136/60 | HR 70 | Temp 98.2°F | Ht 62.0 in | Wt 123.4 lb

## 2022-04-16 DIAGNOSIS — R053 Chronic cough: Secondary | ICD-10-CM

## 2022-04-16 DIAGNOSIS — J4540 Moderate persistent asthma, uncomplicated: Secondary | ICD-10-CM

## 2022-04-16 MED ORDER — OMEPRAZOLE 20 MG PO CPDR: 20.0000 mg | DELAYED_RELEASE_CAPSULE | Freq: Every day | ORAL | 5 refills | Status: DC

## 2022-04-16 NOTE — Progress Notes (Signed)
Diana Cisneros    CT:[id number]    May 28, 1945  Primary Care Physician:Polite, Jori Moll, MD  Referring Physician: Seward Enedelia, MD [address] [place],  [place] [address]  Chief complaint: Follow-up for cough, asthma  HPI: 77 y.o.  with history of allergic rhinitis, hyperlipidemia.  Complains of chronic cough for the past 1 to 2 months associated with clear to white mucus.  She has minimal dyspnea on exertion.  No symptoms at rest.  No wheezing, fevers, chills Cough is exacerbated on tilting her neck back and lying down.  She has history of allergic rhinitis, postnasal drip and takes loratadine at night.  No symptoms of GERD.  Developed COVID-19 in December 2023 just treated with Paxlovid.  She is feels much better but has persistent dry cough.  Pets: Has a dog and a cat. 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  No recent exposure.  No dampness, Jacuzzi, hot tub. Smoking history: Minimal smoking as a teenager. Travel history: Originally from [place] [employer].  Lived in [place].  No significant recent travel. Relevant family history: No significant family history of lung disease.  Interim history: Seen last month for sinus congestion at that time she was advised to take Flonase, chlorphentermine.  She is not taking the chlorphentermine due to confusion with instructions. She is also supposed to be on Prilosec but is not taking it.  Outpatient Encounter Medications as of 04/16/2022  Medication Sig   alendronate (FOSAMAX) 70 MG tablet Take 1 tablet (70 mg total) by mouth every 7 (seven) days. Take first thing in am with 6 oz. Water.  Be upright after taking.  Eat nothing for one hour.   Ascorbic Acid (VITAMIN C) 100 MG tablet Take 100 mg by mouth daily.     augmented betamethasone dipropionate (DIPROLENE-AF) 0.05 % cream APPLY TO AFFECTED AREA AS NEEDED    benzonatate (TESSALON) 100 MG capsule Take 100 mg by mouth 3 (three) times daily as needed.   BREO ELLIPTA 200-25 MCG/ACT AEPB INHALE 1 PUFF BY MOUTH EVERY DAY   Calcium Carbonate+Vitamin D 600-200 MG-UNIT TABS 1 tablet   Calcium Carbonate-Vitamin D (CALCIUM + D PO) Take 1 tablet by mouth daily.    chlorpheniramine (CHLOR-TRIMETON) 4 MG tablet Take 4 mg by mouth 2 (two) times daily as needed for allergies.   diazepam (VALIUM) 5 MG tablet Take 5 mg by mouth daily as needed.   diclofenac sodium (VOLTAREN) 1 % GEL APPLY 2 4 GRAMS 3 TO 4 TIMES DAILY   ezetimibe (ZETIA) 10 MG tablet Take 1 tablet (10 mg total) by mouth daily.   fluticasone (CUTIVATE) 0.005 % ointment SMARTSIG:Sparingly Topical Twice Daily   gabapentin (NEURONTIN) 100 MG capsule Take 100-300 mg by mouth at bedtime as needed (pain or insomnia).   Glucosamine-Chondroit-Vit C-Mn (GLUCOSAMINE CHONDR 1500 COMPLX PO) See admin instructions.   glucosamine-chondroitin 500-400 MG tablet Take 1 tablet by mouth daily.     losartan (COZAAR) 25 MG tablet Take 25 mg by mouth daily.   Magnesium 300 MG CAPS See admin instructions.   meloxicam (MOBIC) 15 MG tablet Take 7.5 mg by mouth daily.   methocarbamol (ROBAXIN) 750 MG tablet Take 750 mg by mouth every 8 (eight) hours as needed.   Misc Natural Products (OSTEO BI-FLEX JOINT SHIELD PO) See admin instructions.   ondansetron (ZOFRAN) 4 MG tablet  Take 1 tablet (4 mg total) by mouth every 8 (eight) hours as needed for nausea or vomiting.   pantoprazole (PROTONIX) 40 MG tablet Take 1 tablet (40 mg total) by mouth daily.   Potassium 75 MG TABS See admin instructions.   rosuvastatin (CRESTOR) 20 MG tablet Take 1 tablet (20 mg total) by mouth daily.   Specialty Vitamins Products (MAGNESIUM, AMINO ACID CHELATE,) 133 MG tablet Take 1 tablet by mouth daily.     SUPER B COMPLEX/C PO See admin instructions.   traMADol (ULTRAM) 50 MG tablet TAKE 1 TO 2 TABLETS BY MOUTH 2 TO 3 TIMES DAILY AS NEEDED   vitamin  B-12 (CYANOCOBALAMIN) 100 MCG tablet 1 tablet   VITAMIN D, CHOLECALCIFEROL, PO Take 1 tablet by mouth daily.    No facility-administered encounter medications on file as of 04/16/2022.   Physical Exam: Blood pressure 136/60, pulse 70, temperature 98.2 F (36.8 C), temperature source Oral, height 5\' 2"  (1.575 m), weight 123 lb 6.4 oz (56 kg), SpO2 97 %. Gen:      No acute distress HEENT:  EOMI, sclera anicteric Neck:     No masses; no thyromegaly Lungs:    Clear to auscultation bilaterally; normal respiratory effort CV:         Regular rate and rhythm; no murmurs Abd:      + bowel sounds; soft, non-tender; no palpable masses, no distension Ext:    No edema; adequate peripheral perfusion Skin:      Warm and dry; no rash Neuro: alert and oriented x 3 Psych: normal mood and affect   Data Reviewed: Imaging: CT chest 04/09/2007-visualized lung bases are normal. Chest x-ray 06/01/2015-clear lungs, no acute cardiopulmonary abnormality. Chest x-ray 10/29/2017- no active lung disease, chronic scarring the right middle lobe and lingula. Cardiac CT 05/31/2019-visualized lungs show mild bronchiectasis with tree-in-bud appearance. High-resolution CT 08/26/2019-mild areas of centrilobular nodularity, tree-in-bud with bronchial plugging. High-resolution CT 09/05/2021-stable areas of centrilobular nodularity, tree-in-bud Chest x-ray 03/18/2022-no acute findings I have reviewed the images personally.  PFTs: 11/24/2017 FVC 2.35 [89%), FEV1 1.53 [77%), F/F 65 post FEV1 1.90 [96%, +23%), TLC 115%, DLCO 18% Moderate obstruction with significant bronchodilator response and air trapping.  FENO 10/29/2017-18 FENO 11/24/2017-23  ACT score  04/09/2019-22 09/17/2021-24  Labs: CBC 10/29/2017-WBC 5.2, eos 2.8%, absolute eosinophil count 146 CBC 04/01/2019-WBC 5.4, eos 2.1%, absolute eosinophil count 113  Respiratory allergy profile allergy profile 10/29/2017-IgE 31, RAST panel-negative Respiratory allergy profile  04/09/2019-IgE 28, RAST panel negative  Sputum cultures 08/31/2019-Enterobacter, AFB is negative  Assessment:  Follow-up for chronic cough Suspect upper airway cough from postnasal drip, possibly silent acid reflux Advised to take chlorpheniramine and Prilosec over-the-counter Continue steroid nasal spray  Asthma PFTs reviewed with obstruction and significant bronchodilator response No evidence elevated IgE or FENO, mild elevation in peripheral eosinophilia  Stable on Breo  Mild bronchiectasis Has mild bronchiectasis on CT scan with tree-in-bud.  Treated for Enterobacter in sputum in 2021.  Sputum for AFB is negative Currently asymptomatic.  CT reviewed from 2023   Plan/Recommendations: - Chlorphentermine PRN - Prilosec - Continue Arneta Cliche MD [facility] 04/16/2022, 1:59 PM  CC: Seward Takeesha, MD

## 2022-04-16 NOTE — Patient Instructions (Signed)
For your cough continue to take the Flonase nasal spray Take chlorpheniramine tablets 4 mg 3 times a day.  This medication is available over-the-counter and also called Lortabs  Start taking Prilosec 20 mg at night for acid reflux Continue Breo Follow-up in 3 months

## 2022-04-17 NOTE — Progress Notes (Signed)
CARDIOLOGY CONSULT NOTE       Patient ID: Diana Cisneros MRN: [id number] DOB/AGE: January 30, 1945 77 y.o.  Admit date: (Not on file) Referring Physician: [place] Primary Physician: Seward Storey, MD Primary Cardiologist: [name] Reason for Consultation: Chest Pain    HPI:  77 y.o. with bronchospastic lung disease seen by [facility], family history of CAD, severe unRx HLD.Tolerating crestor and zetia added in March 2024 She is a friend of my next door neighbor and does [employer]. First seen 04/20/19 noted more dyspnea/fatigue and chest pain Seen by [facility] in pulmonary with inhaler RX   Cardiac CTA done 05/31/19 was normal with calcium score 0 Non contrast lung CT shows tree in bud bronchiectasis ? MAI PFTls showed significant bronchodilator response prescribed Breo  LDL 108 prior to starting zetia Sees lipid clinic   BP up a bit started on ARB by primary    ROS All other systems reviewed and negative except as noted above  Past Medical History:  Diagnosis Date   Allergic rhinitis    Anxiety    Anxiousness    Degenerative disc disease    thoracic,lumbar   Hypercholesterolemia    Hyperlipidemia    Low back pain    Moderate persistent asthma without complication [id number]   Muscle cramps    Nail dystrophy 12/30/2018   Osteopenia 10/2018   T score -2.2 distal third of forearm.  -1.6 left femoral neck FRAX 18% / 6.7%   Seasonal allergies    Sinusitis, acute    URI (upper respiratory infection)     Family History  Problem Relation Age of Onset   Heart disease Mother    Stroke Father    Diabetes Maternal Grandmother    Heart disease Maternal Grandmother    Diabetes Maternal Grandfather    Cancer Maternal Grandfather        Colon   Breast cancer Paternal Grandmother        Age 26    Social History   Socioeconomic History   Marital status: Married    Spouse name: Not on file   Number of children: Not on file   Years of education: Not on file   Highest  education level: Not on file  Occupational History   Not on file  Tobacco Use   Smoking status: Former    Packs/day: 0    Types: Cigarettes    Quit date: 1965    Years since quitting: 59.2    Passive exposure: Past   Smokeless tobacco: Never   Tobacco comments:    one pack of cigarettes would last two weeks-10/29/17  Vaping Use   Vaping Use: Never used  Substance and Sexual Activity   Alcohol use: Yes    Alcohol/week: 2.0 standard drinks of alcohol    Types: 2 Standard drinks or equivalent per week    Comment: occ   Drug use: No   Sexual activity: Not Currently    Birth control/protection: Post-menopausal, Surgical    Comment: 1st intercourse 77 yo-More than 5 partners  Other Topics Concern   Not on file  Social History Narrative   Not on file   Social Determinants of Health   Financial Resource Strain: Not on file  Food Insecurity: Not on file  Transportation Needs: Not on file  Physical Activity: Not on file  Stress: Not on file  Social Connections: Not on file  Intimate Partner Violence: Not on file    Past Surgical History:  Procedure Laterality Date  ABDOMINAL HYSTERECTOMY  1986   TAH  LSO   APPENDECTOMY  2009   BREAST EXCISIONAL BIOPSY Left 1995   BREAST SURGERY     Biopsy benign   COLONOSCOPY WITH PROPOFOL N/A 12/27/2014   Procedure: COLONOSCOPY WITH PROPOFOL;  Surgeon: Garlan Fair, MD;  Location: [facility];  Service: Endoscopy;  Laterality: N/A;   KNEE SURGERY     arthroscopic   OOPHORECTOMY     LSO 86,RSO 95   PELVIC LAPAROSCOPY  1995   RSO      Current Outpatient Medications:    alendronate (FOSAMAX) 70 MG tablet, Take 1 tablet (70 mg total) by mouth every 7 (seven) days. Take first thing in am with 6 oz. Water.  Be upright after taking.  Eat nothing for one hour., Disp: 12 tablet, Rfl: 3   Ascorbic Acid (VITAMIN C) 100 MG tablet, Take 100 mg by mouth daily.  , Disp: , Rfl:    augmented betamethasone dipropionate (DIPROLENE-AF) 0.05 %  cream, APPLY TO AFFECTED AREA AS NEEDED, Disp: 50 g, Rfl: 0   benzonatate (TESSALON) 100 MG capsule, Take 100 mg by mouth 3 (three) times daily as needed., Disp: , Rfl:    BREO ELLIPTA 200-25 MCG/ACT AEPB, INHALE 1 PUFF BY MOUTH EVERY DAY, Disp: 60 each, Rfl: 5   Calcium Carbonate+Vitamin D 600-200 MG-UNIT TABS, 1 tablet, Disp: , Rfl:    Calcium Carbonate-Vitamin D (CALCIUM + D PO), Take 1 tablet by mouth daily. , Disp: , Rfl:    chlorpheniramine (CHLOR-TRIMETON) 4 MG tablet, Take 4 mg by mouth 2 (two) times daily as needed for allergies., Disp: , Rfl:    diazepam (VALIUM) 5 MG tablet, Take 5 mg by mouth daily as needed., Disp: , Rfl: 3   diclofenac sodium (VOLTAREN) 1 % GEL, APPLY 2 4 GRAMS 3 TO 4 TIMES DAILY, Disp: , Rfl:    ezetimibe (ZETIA) 10 MG tablet, Take 1 tablet (10 mg total) by mouth daily., Disp: 90 tablet, Rfl: 3   fluticasone (CUTIVATE) 0.005 % ointment, SMARTSIG:Sparingly Topical Twice Daily, Disp: , Rfl:    gabapentin (NEURONTIN) 100 MG capsule, Take 100-300 mg by mouth at bedtime as needed (pain or insomnia)., Disp: , Rfl:    glucosamine-chondroitin 500-400 MG tablet, Take 1 tablet by mouth daily.  , Disp: , Rfl:    losartan (COZAAR) 25 MG tablet, Take 25 mg by mouth daily., Disp: , Rfl:    Magnesium 300 MG CAPS, See admin instructions., Disp: , Rfl:    meloxicam (MOBIC) 15 MG tablet, Take 7.5 mg by mouth daily., Disp: , Rfl: 2   methocarbamol (ROBAXIN) 750 MG tablet, Take 750 mg by mouth every 8 (eight) hours as needed., Disp: , Rfl:    Misc Natural Products (OSTEO BI-FLEX JOINT SHIELD PO), See admin instructions., Disp: , Rfl:    omeprazole (PRILOSEC) 20 MG capsule, Take 1 capsule (20 mg total) by mouth at bedtime., Disp: 30 capsule, Rfl: 5   ondansetron (ZOFRAN) 4 MG tablet, Take 1 tablet (4 mg total) by mouth every 8 (eight) hours as needed for nausea or vomiting., Disp: 20 tablet, Rfl: 0   pantoprazole (PROTONIX) 40 MG tablet, Take 1 tablet (40 mg total) by mouth daily.,  Disp: 7 tablet, Rfl: 0   Potassium 75 MG TABS, See admin instructions., Disp: , Rfl:    rosuvastatin (CRESTOR) 20 MG tablet, Take 1 tablet (20 mg total) by mouth daily., Disp: 30 tablet, Rfl: 11   Specialty Vitamins Products (MAGNESIUM, AMINO  ACID CHELATE,) 133 MG tablet, Take 1 tablet by mouth daily.  , Disp: , Rfl:    SUPER B COMPLEX/C PO, See admin instructions., Disp: , Rfl:    traMADol (ULTRAM) 50 MG tablet, TAKE 1 TO 2 TABLETS BY MOUTH 2 TO 3 TIMES DAILY AS NEEDED, Disp: , Rfl:    vitamin B-12 (CYANOCOBALAMIN) 100 MCG tablet, 1 tablet, Disp: , Rfl:    VITAMIN D, CHOLECALCIFEROL, PO, Take 1 tablet by mouth daily. , Disp: , Rfl:    Glucosamine-Chondroit-Vit C-Mn (GLUCOSAMINE CHONDR 1500 COMPLX PO), See admin instructions. (Patient not taking: Reported on 04/24/2022), Disp: , Rfl:     Physical Exam: Blood pressure 130/86, pulse 73, height 5\' 2"  (1.575 m), weight 122 lb 3.2 oz (55.4 kg), SpO2 97 %.   Affect appropriate Healthy:  appears stated age 73: normal Neck supple with no adenopathy JVP normal no bruits no thyromegaly Lungs clear with no wheezing and good diaphragmatic motion Heart:  S1/S2 no murmur, no rub, gallop or click PMI normal Abdomen: benighn, BS positve, no tenderness, no AAA no bruit.  No HSM or HJR Distal pulses intact with no bruits No edema Neuro non-focal Skin warm and dry No muscular weakness   Labs:   Lab Results  Component Value Date   WBC 5.4 04/09/2019   HGB 13.4 04/09/2019   HCT 40.8 04/09/2019   MCV 89.9 04/09/2019   PLT 173.0 04/09/2019   No results for input(s): "NA", "K", "CL", "CO2", "BUN", "CREATININE", "CALCIUM", "PROT", "BILITOT", "ALKPHOS", "ALT", "AST", "GLUCOSE" in the last 168 hours.  Invalid input(s): "LABALBU" No results found for: "CKTOTAL", "CKMB", "CKMBINDEX", "TROPONINI"  Lab Results  Component Value Date   CHOL 197 04/01/2022   CHOL 200 (H) 01/07/2022   CHOL 192 07/27/2021   Lab Results  Component Value Date   HDL  79 04/01/2022   HDL 66 01/07/2022   HDL 79 07/27/2021   Lab Results  Component Value Date   LDLCALC 108 (H) 04/01/2022   LDLCALC 121 (H) 01/07/2022   LDLCALC 105 (H) 07/27/2021   Lab Results  Component Value Date   TRIG 56 04/01/2022   TRIG 70 01/07/2022   TRIG 43 07/27/2021   Lab Results  Component Value Date   CHOLHDL 2.5 04/01/2022   CHOLHDL 3.0 01/07/2022   CHOLHDL 2.4 07/27/2021   No results found for: "LDLDIRECT"    Radiology: No results found.   EKG:  04/24/2022 SR rate 73 PAC otherwise noraml    ASSESSMENT AND PLAN:   1. Chest Pain:  Atypical cardiac CTA 05/31/19 normal  2. Dyspnea:  Asthma/Bronchiectasis f/u Pulmonary  Had COVID December 2023 with persistent dry cough On prilosec and chlorpheniramine for reflux and post nasal drip On Breo for asthmatic component F/U Dr Vaughan Browner 3. HLD: On crestor and zetia repeat labs and f/u lipid clinic  4. HTN:  on ARB f/u primary weight is good low salt DASH diet   F/U  PRN  Signed: Jenkins Rouge 04/24/2022, 9:29 AM

## 2022-04-24 ENCOUNTER — Ambulatory Visit: Payer: Medicare Other | Attending: Cardiovascular Disease | Admitting: Cardiovascular Disease

## 2022-04-24 ENCOUNTER — Encounter: Payer: Self-pay | Admitting: Cardiovascular Disease

## 2022-04-24 VITALS — BP 130/86 | HR 73 | Ht 62.0 in | Wt 122.2 lb

## 2022-04-24 DIAGNOSIS — R0600 Dyspnea, unspecified: Secondary | ICD-10-CM | POA: Diagnosis not present

## 2022-04-24 DIAGNOSIS — E7800 Pure hypercholesterolemia, unspecified: Secondary | ICD-10-CM | POA: Diagnosis not present

## 2022-04-24 DIAGNOSIS — E782 Mixed hyperlipidemia: Secondary | ICD-10-CM | POA: Diagnosis not present

## 2022-04-24 DIAGNOSIS — R079 Chest pain, unspecified: Secondary | ICD-10-CM | POA: Diagnosis not present

## 2022-04-24 NOTE — Patient Instructions (Addendum)
Medication Instructions:  Your physician recommends that you continue on your current medications as directed. Please refer to the Current Medication list given to you today.  *If you need a refill on your cardiac medications before your next appointment, please call your pharmacy*  Lab Work: If you have labs (blood work) drawn today and your tests are completely normal, you will receive your results only by: MyChart Message (if you have MyChart) OR A paper copy in the mail If you have any lab test that is abnormal or we need to change your treatment, we will call you to review the results.  Testing/Procedures: None ordered today.  Follow-Up: At [facility], you and your health needs are our priority.  As part of our continuing mission to provide you with exceptional heart care, we have created designated Provider Care Teams.  These Care Teams include your primary Cardiologist (physician) and Advanced Practice Providers (APPs -  Physician Assistants and Nurse Practitioners) who all work together to provide you with the care you need, when you need it.  We recommend signing up for the patient portal called "MyChart".  Sign up information is provided on this After Visit Summary.  MyChart is used to connect with patients for Virtual Visits (Telemedicine).  Patients are able to view lab/test results, encounter notes, upcoming appointments, etc.  Non-urgent messages can be sent to your provider as well.   To learn more about what you can do with MyChart, go to [email or web address].    Your next appointment:   As needed   Provider:   Peter Nishan, MD      

## 2022-05-20 DIAGNOSIS — D225 Melanocytic nevi of trunk: Secondary | ICD-10-CM | POA: Diagnosis not present

## 2022-05-20 DIAGNOSIS — D2271 Melanocytic nevi of right lower limb, including hip: Secondary | ICD-10-CM | POA: Diagnosis not present

## 2022-05-20 DIAGNOSIS — L821 Other seborrheic keratosis: Secondary | ICD-10-CM | POA: Diagnosis not present

## 2022-05-20 DIAGNOSIS — D2262 Melanocytic nevi of left upper limb, including shoulder: Secondary | ICD-10-CM | POA: Diagnosis not present

## 2022-05-20 DIAGNOSIS — D2261 Melanocytic nevi of right upper limb, including shoulder: Secondary | ICD-10-CM | POA: Diagnosis not present

## 2022-05-20 DIAGNOSIS — D224 Melanocytic nevi of scalp and neck: Secondary | ICD-10-CM | POA: Diagnosis not present

## 2022-05-20 DIAGNOSIS — L570 Actinic keratosis: Secondary | ICD-10-CM | POA: Diagnosis not present

## 2022-05-20 DIAGNOSIS — L820 Inflamed seborrheic keratosis: Secondary | ICD-10-CM | POA: Diagnosis not present

## 2022-05-20 DIAGNOSIS — Z85828 Personal history of other malignant neoplasm of skin: Secondary | ICD-10-CM | POA: Diagnosis not present

## 2022-05-30 ENCOUNTER — Telehealth: Payer: Self-pay | Admitting: Cardiovascular Disease

## 2022-05-30 NOTE — Telephone Encounter (Signed)
Pt c/o medication issue:  1. Name of Medication:   ezetimibe (ZETIA) 10 MG tablet    2. How are you currently taking this medication (dosage and times per day)? As written  3. Are you having a reaction (difficulty breathing--STAT)? no  4. What is your medication issue? Pt states she is having some troubles taking this medication and cramps. She asked to speak to one of you about it.

## 2022-05-31 NOTE — Telephone Encounter (Signed)
Called pt back, had been taking rosuvastatin 20mg  daily which she continues to tolerate well. Started on ezetimbe 2 months ago. 2-3 weeks ago started experiencing muscle pain and full body cramps, had difficulty walking. Stopped ezetimibe 3 days ago and is already feeling better.  Previously took [employer] but had a lot of difficulty with the injections.  Pt will try cutting her ezetimibe in half and take 5mg  daily. Med list updated but new rx not sent in, will see how pt tolerates first. If this also causes side effects, she will let us know and would recommend trying Nexletol at that time. She will continue on her rosuvastatin and keep f/u labs scheduled on 5/20.

## 2022-06-03 ENCOUNTER — Other Ambulatory Visit: Payer: Self-pay | Admitting: Pulmonary Disease

## 2022-06-14 ENCOUNTER — Encounter: Payer: Self-pay | Admitting: Obstetrics and Gynecology

## 2022-06-14 ENCOUNTER — Ambulatory Visit: Payer: Medicare Other | Admitting: Podiatry

## 2022-06-14 ENCOUNTER — Ambulatory Visit (INDEPENDENT_AMBULATORY_CARE_PROVIDER_SITE_OTHER): Payer: Medicare Other | Admitting: Obstetrics and Gynecology

## 2022-06-14 ENCOUNTER — Other Ambulatory Visit: Payer: Self-pay | Admitting: Obstetrics and Gynecology

## 2022-06-14 VITALS — BP 118/82 | Resp 18

## 2022-06-14 DIAGNOSIS — R350 Frequency of micturition: Secondary | ICD-10-CM

## 2022-06-14 DIAGNOSIS — N3281 Overactive bladder: Secondary | ICD-10-CM | POA: Diagnosis not present

## 2022-06-14 LAB — URINALYSIS, COMPLETE
Bacteria, UA: NONE SEEN /HPF
Bilirubin Urine: NEGATIVE
Casts: NONE SEEN /LPF
Crystals: NONE SEEN /HPF
Glucose, UA: NEGATIVE
Hyaline Cast: NONE SEEN /LPF
Ketones, ur: NEGATIVE
Leukocytes,Ua: NEGATIVE
Nitrite: NEGATIVE
Protein, ur: NEGATIVE
Specific Gravity, Urine: 1.015 (ref 1.001–1.035)
WBC, UA: NONE SEEN /HPF (ref 0–5)
Yeast: NONE SEEN /HPF
pH: 7 (ref 5.0–8.0)

## 2022-06-14 MED ORDER — DARIFENACIN HYDROBROMIDE ER 7.5 MG PO TB24
7.5000 mg | ORAL_TABLET | Freq: Every day | ORAL | 1 refills | Status: AC
Start: 2022-06-14 — End: ?

## 2022-06-14 NOTE — Patient Instructions (Signed)

## 2022-06-14 NOTE — Progress Notes (Signed)
GYNECOLOGY  VISIT   HPI: 77 y.o.   Married White or Caucasian Not Hispanic or Latino  female   G0P0 with No LMP recorded. Patient has had a hysterectomy.   here for a 1 month h/o increased urinary frequency, has urgency to void. Voiding small amounts. Just very occasional pain with voiding.  No fevers, no flank pain, in incontinence.   She has always gotten up at night to void, used to be up 1-2 x a night, now 3-4 x a night. Voiding small amounts at night as well.  She drinks tea daily.   GYNECOLOGIC HISTORY: No LMP recorded. Patient has had a hysterectomy. Contraception:Post-menopausal. Hysterectomy Menopausal hormone therapy: None        OB History     Gravida  0   Para      Term      Preterm      AB      Living         SAB      IAB      Ectopic      Multiple      Live Births                 Patient Active Problem List   Diagnosis Date Noted   Acute sinusitis 07/19/2020   Allergic rhinitis 07/19/2020   Anxiety state 07/19/2020   Hardening of the aorta (main artery of the heart) (HCC) 07/19/2020   Pure hypercholesterolemia 07/19/2020   Upper respiratory infection 07/19/2020   Hyperlipidemia 04/26/2019   Statin myopathy 04/26/2019   Nail dystrophy 12/30/2018   Moderate persistent asthma without complication 02/25/2018   Degenerative disc disease     Past Medical History:  Diagnosis Date   Allergic rhinitis    Anxiety    Anxiousness    Degenerative disc disease    thoracic,lumbar   Hypercholesterolemia    Hyperlipidemia    Low back pain    Moderate persistent asthma without complication 02/25/2018   Muscle cramps    Nail dystrophy 12/30/2018   Osteopenia 10/2018   T score -2.2 distal third of forearm.  -1.6 left femoral neck FRAX 18% / 6.7%   Seasonal allergies    Sinusitis, acute    URI (upper respiratory infection)     Past Surgical History:  Procedure Laterality Date   ABDOMINAL HYSTERECTOMY  1986   TAH  LSO   APPENDECTOMY  2009    BREAST EXCISIONAL BIOPSY Left 1995   BREAST SURGERY     Biopsy benign   COLONOSCOPY WITH PROPOFOL N/A 12/27/2014   Procedure: COLONOSCOPY WITH PROPOFOL;  Surgeon: Charolett Bumpers, MD;  Location: [facility];  Service: Endoscopy;  Laterality: N/A;   KNEE SURGERY     arthroscopic   OOPHORECTOMY     LSO 86,RSO 95   PELVIC LAPAROSCOPY  1995   RSO    Current Outpatient Medications  Medication Sig Dispense Refill   alendronate (FOSAMAX) 70 MG tablet Take 1 tablet (70 mg total) by mouth every 7 (seven) days. Take first thing in am with 6 oz. Water.  Be upright after taking.  Eat nothing for one hour. 12 tablet 3   Ascorbic Acid (VITAMIN C) 100 MG tablet Take 100 mg by mouth daily.       augmented betamethasone dipropionate (DIPROLENE-AF) 0.05 % cream APPLY TO AFFECTED AREA AS NEEDED 50 g 0   benzonatate (TESSALON) 100 MG capsule Take 100 mg by mouth 3 (three) times daily as needed.  BREO ELLIPTA 200-25 MCG/ACT AEPB INHALE 1 PUFF BY MOUTH EVERY DAY 60 each 5   Calcium Carbonate+Vitamin D 600-200 MG-UNIT TABS 1 tablet     Calcium Carbonate-Vitamin D (CALCIUM + D PO) Take 1 tablet by mouth daily.      chlorpheniramine (CHLOR-TRIMETON) 4 MG tablet Take 4 mg by mouth 2 (two) times daily as needed for allergies.     diazepam (VALIUM) 5 MG tablet Take 5 mg by mouth daily as needed.  3   diclofenac sodium (VOLTAREN) 1 % GEL APPLY 2 4 GRAMS 3 TO 4 TIMES DAILY     ezetimibe (ZETIA) 10 MG tablet Take 0.5 tablets (5 mg total) by mouth daily. 90 tablet 3   fluticasone (CUTIVATE) 0.005 % ointment SMARTSIG:Sparingly Topical Twice Daily     gabapentin (NEURONTIN) 100 MG capsule Take 100-300 mg by mouth at bedtime as needed (pain or insomnia).     Glucosamine-Chondroit-Vit C-Mn (GLUCOSAMINE CHONDR 1500 COMPLX PO) See admin instructions. (Patient not taking: Reported on 04/24/2022)     glucosamine-chondroitin 500-400 MG tablet Take 1 tablet by mouth daily.       losartan (COZAAR) 25 MG tablet Take 25 mg by  mouth daily.     Magnesium 300 MG CAPS See admin instructions.     meloxicam (MOBIC) 15 MG tablet Take 7.5 mg by mouth daily.  2   methocarbamol (ROBAXIN) 750 MG tablet Take 750 mg by mouth every 8 (eight) hours as needed.     Misc Natural Products (OSTEO BI-FLEX JOINT SHIELD PO) See admin instructions.     omeprazole (PRILOSEC) 20 MG capsule Take 1 capsule (20 mg total) by mouth at bedtime. 30 capsule 5   ondansetron (ZOFRAN) 4 MG tablet Take 1 tablet (4 mg total) by mouth every 8 (eight) hours as needed for nausea or vomiting. 20 tablet 0   pantoprazole (PROTONIX) 40 MG tablet Take 1 tablet (40 mg total) by mouth daily. 7 tablet 0   Potassium 75 MG TABS See admin instructions.     rosuvastatin (CRESTOR) 20 MG tablet Take 1 tablet (20 mg total) by mouth daily. 30 tablet 11   Specialty Vitamins Products (MAGNESIUM, AMINO ACID CHELATE,) 133 MG tablet Take 1 tablet by mouth daily.       SUPER B COMPLEX/C PO See admin instructions.     traMADol (ULTRAM) 50 MG tablet TAKE 1 TO 2 TABLETS BY MOUTH 2 TO 3 TIMES DAILY AS NEEDED     vitamin B-12 (CYANOCOBALAMIN) 100 MCG tablet 1 tablet     VITAMIN D, CHOLECALCIFEROL, PO Take 1 tablet by mouth daily.      No current facility-administered medications for this visit.     ALLERGIES: Morphine and codeine, Morphine sulfate, Other, and Naproxen sodium  Family History  Problem Relation Age of Onset   Heart disease Mother    Stroke Father    Diabetes Maternal Grandmother    Heart disease Maternal Grandmother    Diabetes Maternal Grandfather    Cancer Maternal Grandfather        Colon   Breast cancer Paternal Grandmother        Age 95    Social History   Socioeconomic History   Marital status: Married    Spouse name: Not on file   Number of children: Not on file   Years of education: Not on file   Highest education level: Not on file  Occupational History   Not on file  Tobacco Use   Smoking status:  Former    Packs/day: 0    Types:  Cigarettes    Quit date: 1965    Years since quitting: 59.4    Passive exposure: Past   Smokeless tobacco: Never   Tobacco comments:    one pack of cigarettes would last two weeks-10/29/17  Vaping Use   Vaping Use: Never used  Substance and Sexual Activity   Alcohol use: Yes    Alcohol/week: 2.0 standard drinks of alcohol    Types: 2 Standard drinks or equivalent per week    Comment: occ   Drug use: No   Sexual activity: Not Currently    Birth control/protection: Post-menopausal, Surgical    Comment: 1st intercourse 77 yo-More than 5 partners  Other Topics Concern   Not on file  Social History Narrative   Not on file   Social Determinants of Health   Financial Resource Strain: Not on file  Food Insecurity: Not on file  Transportation Needs: Not on file  Physical Activity: Not on file  Stress: Not on file  Social Connections: Not on file  Intimate Partner Violence: Not on file    Review of Systems  Genitourinary:  Positive for dysuria and frequency.    PHYSICAL EXAMINATION:    There were no vitals taken for this visit.    General appearance: alert, cooperative and appears stated age CVA: not tender Abdomen: soft, non-tender; non distended, no masses,  no organomegaly  1. Urinary frequency - Urinalysis, Complete - Urine Culture  2. Overactive bladder Recommended she avoid caffeine -Bladder training information given - darifenacin (ENABLEX) 7.5 MG 24 hr tablet; Take 1 tablet (7.5 mg total) by mouth daily. ONE PO QD  Dispense: 30 tablet; Refill: 1

## 2022-06-15 LAB — URINE CULTURE
MICRO NUMBER:: 14971414
Result:: NO GROWTH
SPECIMEN QUALITY:: ADEQUATE

## 2022-06-17 ENCOUNTER — Ambulatory Visit: Payer: Medicare Other | Attending: Cardiovascular Disease

## 2022-06-17 ENCOUNTER — Other Ambulatory Visit: Payer: Self-pay | Admitting: Obstetrics and Gynecology

## 2022-06-17 DIAGNOSIS — E782 Mixed hyperlipidemia: Secondary | ICD-10-CM | POA: Diagnosis not present

## 2022-06-17 DIAGNOSIS — N3281 Overactive bladder: Secondary | ICD-10-CM

## 2022-06-18 LAB — LIPID PANEL
Chol/HDL Ratio: 2.1 ratio (ref 0.0–4.4)
Cholesterol, Total: 137 mg/dL (ref 100–199)
HDL: 64 mg/dL (ref >39)
LDL Chol Calc (NIH): 61 mg/dL (ref 0–99)
Triglycerides: 55 mg/dL (ref 0–149)
VLDL Cholesterol Cal: 12 mg/dL (ref 5–40)

## 2022-06-18 LAB — HEPATIC FUNCTION PANEL
ALT: 24 IU/L (ref 0–32)
AST: 31 IU/L (ref 0–40)
Albumin: 4.3 g/dL (ref 3.8–4.8)
Alkaline Phosphatase: 55 IU/L (ref 44–121)
Bilirubin Total: 0.6 mg/dL (ref 0.0–1.2)
Bilirubin, Direct: 0.15 mg/dL (ref 0.00–0.40)
Total Protein: 6.3 g/dL (ref 6.0–8.5)

## 2022-06-20 ENCOUNTER — Other Ambulatory Visit: Payer: Self-pay | Admitting: Pharmacist

## 2022-06-20 MED ORDER — ROSUVASTATIN CALCIUM 20 MG PO TABS: 20.0000 mg | ORAL_TABLET | Freq: Every day | ORAL | 3 refills | Status: DC

## 2022-06-20 MED ORDER — EZETIMIBE 10 MG PO TABS: 5.0000 mg | ORAL_TABLET | Freq: Every day | ORAL | 3 refills | Status: DC

## 2022-07-02 ENCOUNTER — Telehealth: Payer: Self-pay

## 2022-07-02 NOTE — Telephone Encounter (Signed)
Pt LVM in triage line stating that pharmacy has been trying to reach [place] as far as medication change/alternate for bladder control.  Looks a change was requested electronically from pharmacy on 06/17/2022 for Darifenacin ER 7.5MG  tab. Spoke w/ pharmacy and they stated that the medication is not covered under pt's formulary and they were not given alternatives. Pt advised to call insurance and check for alternates-she voiced understanding.  Pt also reporting feeling a sx to what feels like a "groin pull" x3 weeks now and she states she researched that that could be a side effect from the fosamax and wants to inquire what she should do?  Denies any recent over exertion or trauma/injury to area and area doesn't seem to be getting any better since noticed. States is not too painful but certainly not comfortable to be walking around with. Please advise.

## 2022-07-02 NOTE — Telephone Encounter (Signed)
We will wait to determine a new overactive bladder medication when she provides [place] with a list of approved medications.   I recommend she see her PCP regarding the "groin pull." She may need a x-ray of her hip.

## 2022-07-03 ENCOUNTER — Encounter: Payer: Self-pay | Admitting: Podiatry

## 2022-07-03 ENCOUNTER — Ambulatory Visit: Payer: Medicare Other | Admitting: Podiatry

## 2022-07-03 VITALS — BP 149/88

## 2022-07-03 DIAGNOSIS — B351 Tinea unguium: Secondary | ICD-10-CM

## 2022-07-03 DIAGNOSIS — M79671 Pain in right foot: Secondary | ICD-10-CM | POA: Diagnosis not present

## 2022-07-03 DIAGNOSIS — M79672 Pain in left foot: Secondary | ICD-10-CM

## 2022-07-03 DIAGNOSIS — M79675 Pain in left toe(s): Secondary | ICD-10-CM

## 2022-07-03 DIAGNOSIS — M79674 Pain in right toe(s): Secondary | ICD-10-CM | POA: Diagnosis not present

## 2022-07-03 DIAGNOSIS — Q828 Other specified congenital malformations of skin: Secondary | ICD-10-CM | POA: Diagnosis not present

## 2022-07-03 NOTE — Telephone Encounter (Signed)
Pt notified and voiced understanding. Will close encounter.  

## 2022-07-08 NOTE — Progress Notes (Signed)
  Subjective:  Patient ID: Diana Cisneros, female    DOB: Jan 20, 1946,  MRN: [id number]  Diana Cisneros presents to clinic today for painful porokeratotic lesion(s) both feet and painful mycotic toenails that limit ambulation. Painful toenails interfere with ambulation. Aggravating factors include wearing enclosed shoe gear. Pain is relieved with periodic professional debridement. Painful porokeratotic lesions are aggravated when weightbearing with and without shoegear. Pain is relieved with periodic professional debridement. Chief Complaint  Patient presents with   Nail Problem    RFC,Referring Provider Renford Dills, MD,LOV:03/24      Callouses    Trim,B/L balls of feet   New problem(s): None.   PCP is Renford Dills, MD.  Allergies  Allergen Reactions   Morphine And Codeine Hives   Morphine Sulfate     Other reaction(s): itching/redface   Other     Other reaction(s): rash/lips swelling   Naproxen Sodium Rash    Review of Systems: Negative except as noted in the HPI. Objective:   Constitutional Diana Cisneros is a pleasant 77 y.o. female, WD, WN in NAD. AAO x 3.   Vascular Vascular Examination: Capillary refill time immediate b/l. Vascular status intact b/l with palpable pedal pulses. Pedal hair present. No edema. No pain with calf compression b/l. Skin temperature gradient WNL b/l. No cyanosis or clubbing b/l.   Neurological Examination: Sensation grossly intact b/l with 10 gram monofilament. Vibratory sensation intact b/l.   Dermatological Examination: Pedal skin with normal turgor, texture and tone b/l.  No open wounds. No interdigital macerations.   Toenails 1-5 b/l thick, discolored, elongated with subungual debris and pain on dorsal palpation.   Porokeratotic lesion(s) submet head 2 b/l. No erythema, no edema, no drainage, no fluctuance.  Musculoskeletal Examination: Normal muscle strength 5/5 to all lower extremity muscle groups bilaterally. HAV with bunion b/l.  Hammertoe(s) noted to the L 2nd toe, L 5th toe, R 2nd toe, and R 5th toe.Marland Kitchen No pain, crepitus or joint limitation noted with ROM b/l LE.  Patient ambulates independently without assistive aids.  Radiographs: None   Assessment:   1. Pain due to onychomycosis of toenails of both feet   2. Porokeratosis   3. Pain in both feet    Plan:  Patient was evaluated and treated and all questions answered. Consent given for treatment as described below: -Mycotic toenails 1-5 bilaterally were debrided in length and girth with sterile nail nippers and dremel without incident. -Porokeratotic lesion(s) submet head 2 b/l pared and enucleated with sterile currette without incident. Total number of lesions debrided=2. -Patient/POA to call should there be question/concern in the interim.  Return in about 9 weeks (around 09/04/2022).  Freddie Breech, DPM

## 2022-07-10 ENCOUNTER — Telehealth: Payer: Self-pay | Admitting: Cardiovascular Disease

## 2022-07-10 DIAGNOSIS — E782 Mixed hyperlipidemia: Secondary | ICD-10-CM

## 2022-07-10 DIAGNOSIS — R0600 Dyspnea, unspecified: Secondary | ICD-10-CM

## 2022-07-10 NOTE — Telephone Encounter (Signed)
Pt c/o medication issue:  1. Name of Medication: ezetimibe (ZETIA) 10 MG tablet   2. How are you currently taking this medication (dosage and times per day)?  Take 0.5 tablets (5 mg total) by mouth daily.       3. Are you having a reaction (difficulty breathing--STAT)? No  4. What is your medication issue? Pt called stating this medication is causing her to have some muscle cramping, tightness in calfs and can hardly walk. She'd like a callback to discuss further. Please advise

## 2022-07-10 NOTE — Telephone Encounter (Signed)
Efraim Kaufmann is out of office until Friday. Baseline LDL 211, improved to 108 on rosuvastatin 20mg  daily. Ezetimibe was then added in March. She called in early May reporting cramping and pain. I decreased her ezetimibe to 5mg  daily, follow up LDL excellent at 61 (44% further drop in LDL with addition of low dose ezetimibe). Previously took [employer] but had difficulty with the injections.  Now calling with side effects on lower dose of ezetimibe. Pt reports she can't walk due to muscle pain her calves and thighs. Even tried 1/2 tablet every other day. Next option would be to try Nexletol. She is interested in trying this. She will continue on rosuvastatin, stop ezetimibe, monitor for symptom improvement, and I will call her once I hear back on Nexletol coverage. PA submitted, Key: [id number].

## 2022-07-10 NOTE — Telephone Encounter (Signed)
Prior authorization denied because medication is not on formulary. I have faxed appeals, should have determination in 7 days.

## 2022-07-19 ENCOUNTER — Other Ambulatory Visit (HOSPITAL_COMMUNITY): Payer: Self-pay

## 2022-07-19 MED ORDER — NEXLETOL 180 MG PO TABS: 1.0000 | ORAL_TABLET | Freq: Every day | ORAL | 11 refills | Status: DC

## 2022-07-19 NOTE — Telephone Encounter (Signed)
Still haven't heard back from pt's insurance. Had tech run test claim, request was approved through 01/28/23. Copay anticipated at $100/month.   Called pt. Rx sent to pharmacy. Advised her to let me know if rx is unaffordable and can pursue grant. F/u labs scheduled in 2 months. She is aware to continue on her rosuvastatin.

## 2022-07-23 ENCOUNTER — Telehealth: Payer: Self-pay | Admitting: Pulmonary Disease

## 2022-07-23 NOTE — Telephone Encounter (Signed)
Pt calling to get refill for loratadine

## 2022-07-24 DIAGNOSIS — M25551 Pain in right hip: Secondary | ICD-10-CM | POA: Diagnosis not present

## 2022-07-24 DIAGNOSIS — M1611 Unilateral primary osteoarthritis, right hip: Secondary | ICD-10-CM | POA: Diagnosis not present

## 2022-07-24 DIAGNOSIS — H6123 Impacted cerumen, bilateral: Secondary | ICD-10-CM | POA: Diagnosis not present

## 2022-07-24 NOTE — Telephone Encounter (Signed)
Pt called clinic, states she picked up Nexletol and that copay was $100. She is interested in the grant. Advised pt to take Nexletol and let us know how she tolerates the med in the next few weeks since she already picked up rx. If she tolerates it well and continues on the med, will apply for [facility] grant, but want to ensure she tolerates med first (to avoid applying for grant and having her potentially stop med due to side effect shortly after). She is in agreement with plan.

## 2022-07-24 NOTE — Telephone Encounter (Signed)
ATC patient.  LM to call back if needed.

## 2022-07-31 MED ORDER — LORATADINE 10 MG PO TABS: 10.0000 mg | ORAL_TABLET | Freq: Every day | ORAL | 5 refills | Status: AC

## 2022-07-31 NOTE — Telephone Encounter (Signed)
Called and spoke with patient. She was requesting a refill on her Claritin to be sent to [facility] on [address]. I advised her that I would go ahead and send this in for her. She verbalized understanding.   Nothing further needed at time of call.

## 2022-08-09 ENCOUNTER — Other Ambulatory Visit: Payer: Self-pay | Admitting: Internal Medicine

## 2022-08-09 DIAGNOSIS — Z1231 Encounter for screening mammogram for malignant neoplasm of breast: Secondary | ICD-10-CM

## 2022-08-20 ENCOUNTER — Telehealth: Payer: Self-pay | Admitting: Pharmacist

## 2022-08-20 NOTE — Telephone Encounter (Signed)
Pt called clinic. Reported cramps in her hands on daily Nexletol but recently started taking it every other day which she is tolerating better. Unfortunately [employer] grant is currently closed. She'll come in for updated labs as scheduled in August. I have added her to our list to enroll in grant once funding reopens.

## 2022-08-23 DIAGNOSIS — H6122 Impacted cerumen, left ear: Secondary | ICD-10-CM | POA: Diagnosis not present

## 2022-09-05 ENCOUNTER — Telehealth: Payer: Self-pay | Admitting: *Deleted

## 2022-09-05 DIAGNOSIS — M810 Age-related osteoporosis without current pathological fracture: Secondary | ICD-10-CM

## 2022-09-05 NOTE — Telephone Encounter (Signed)
Call returned to patient.  Patient is due BMD 11/2022, asking where to schedule?   Advised will place order for BMD to be scheduled at [facility] where she currently gets MMG, patient will call to schedule. Currently scheduling into 01/2023.   Advised recall placed for BMD at [facility]. When scheduling resumes at [facility] may cancel BMD at [facility] and schedule at [facility] if she chooses. Patient agreeable.   BMD order placed.   Last AEX 02/12/22-JJ Next AEX EB 02/18/23  Routing to provider for final review. Patient is agreeable to disposition. Will close encounter.

## 2022-09-06 ENCOUNTER — Ambulatory Visit: Payer: Medicare Other

## 2022-09-11 ENCOUNTER — Ambulatory Visit
Admission: RE | Admit: 2022-09-11 | Discharge: 2022-09-11 | Disposition: A | Discharge status: Home / Self Care | Payer: Medicare Other | Source: Ambulatory Visit | Attending: Internal Medicine | Admitting: Internal Medicine

## 2022-09-11 ENCOUNTER — Encounter: Payer: Self-pay | Admitting: Podiatry

## 2022-09-11 ENCOUNTER — Ambulatory Visit: Payer: Medicare Other | Admitting: Podiatry

## 2022-09-11 DIAGNOSIS — Q828 Other specified congenital malformations of skin: Secondary | ICD-10-CM | POA: Diagnosis not present

## 2022-09-11 DIAGNOSIS — M79671 Pain in right foot: Secondary | ICD-10-CM | POA: Diagnosis not present

## 2022-09-11 DIAGNOSIS — M79674 Pain in right toe(s): Secondary | ICD-10-CM | POA: Diagnosis not present

## 2022-09-11 DIAGNOSIS — M79675 Pain in left toe(s): Secondary | ICD-10-CM

## 2022-09-11 DIAGNOSIS — M79672 Pain in left foot: Secondary | ICD-10-CM

## 2022-09-11 DIAGNOSIS — Z1231 Encounter for screening mammogram for malignant neoplasm of breast: Secondary | ICD-10-CM

## 2022-09-11 DIAGNOSIS — B351 Tinea unguium: Secondary | ICD-10-CM

## 2022-09-11 NOTE — Progress Notes (Signed)
  Subjective:  Patient ID: Diana Cisneros, female    DOB: 1945/05/07,  MRN: [id number]  Diana Cisneros presents to clinic today for painful porokeratotic lesion(s) of both feet and painful mycotic toenails that limit ambulation. Painful toenails interfere with ambulation. Aggravating factors include wearing enclosed shoe gear. Pain is relieved with periodic professional debridement. Painful porokeratotic lesions are aggravated when weightbearing with and without shoegear. Pain is relieved with periodic professional debridement.   New problem(s): None.   PCP is Renford Dills, MD. Theron Arista 03/20/2022.  Allergies  Allergen Reactions   Morphine And Codeine Hives   Morphine Sulfate     Other reaction(s): itching/redface   Other     Other reaction(s): rash/lips swelling   Zetia [Ezetimibe]     Muscle pain and leg cramps on 10mg  daily, 5mg  daily, and 5mg  every other day   Naproxen Sodium Rash    Review of Systems: Negative except as noted in the HPI.  Objective: No changes noted in today's physical examination. There were no vitals filed for this visit. Diana Cisneros is a pleasant 77 y.o. female WD, WN in NAD. AAO x 3.  Vascular Examination: Capillary refill time immediate b/l. Vascular status intact b/l with palpable pedal pulses. Pedal hair present b/l. No pain with calf compression b/l. Skin temperature gradient WNL b/l. No cyanosis or clubbing b/l. No ischemia or gangrene noted b/l.   Neurological Examination: Sensation grossly intact b/l with 10 gram monofilament. Vibratory sensation intact b/l.   Dermatological Examination: Pedal skin with normal turgor, texture and tone b/l.  No open wounds. No interdigital macerations.   Toenails 1-5 b/l thick, discolored, elongated with subungual debris and pain on dorsal palpation.   Porokeratotic lesion(s) submet head 2 b/l. No erythema, no edema, no drainage, no fluctuance.  Musculoskeletal Examination: Normal muscle strength 5/5 to all lower  extremity muscle groups bilaterally. HAV with bunion b/l. Hammertoe(s) noted to the L 2nd toe, L 5th toe, R 2nd toe, and R 5th toe. No pain, crepitus or joint limitation noted with ROM b/l LE.  Patient ambulates independently without assistive aids.  Radiographs: None  Assessment/Plan: 1. Pain due to onychomycosis of toenails of both feet   2. Porokeratosis   3. Pain in both feet     -Consent given for treatment as described below: -Examined patient. -Patient to continue soft, supportive shoe gear daily. -Toenails 1-5 b/l were debrided in length and girth with sterile nail nippers and dremel without iatrogenic bleeding.  -Porokeratotic lesion(s) submet head 2 left foot and submet head 2 right foot pared and enucleated with sterile currette without incident. Total number of lesions debrided=2. -Patient/POA to call should there be question/concern in the interim.   Return in about 3 months (around 12/12/2022).  Freddie Breech, DPM

## 2022-09-16 ENCOUNTER — Ambulatory Visit: Payer: Medicare Other | Attending: Internal Medicine

## 2022-09-24 ENCOUNTER — Other Ambulatory Visit: Payer: Self-pay | Admitting: Internal Medicine

## 2022-09-24 DIAGNOSIS — Z1231 Encounter for screening mammogram for malignant neoplasm of breast: Secondary | ICD-10-CM

## 2022-09-27 ENCOUNTER — Ambulatory Visit: Payer: Medicare Other | Attending: Internal Medicine

## 2022-09-27 DIAGNOSIS — E782 Mixed hyperlipidemia: Secondary | ICD-10-CM

## 2022-09-27 LAB — HEPATIC FUNCTION PANEL
ALT: 26 IU/L (ref 0–32)
AST: 31 IU/L (ref 0–40)
Albumin: 4.3 g/dL (ref 3.8–4.8)
Alkaline Phosphatase: 44 IU/L (ref 44–121)
Bilirubin Total: 0.5 mg/dL (ref 0.0–1.2)
Bilirubin, Direct: 0.14 mg/dL (ref 0.00–0.40)
Total Protein: 6.5 g/dL (ref 6.0–8.5)

## 2022-09-27 LAB — LIPID PANEL
Chol/HDL Ratio: 2.7 ratio (ref 0.0–4.4)
Cholesterol, Total: 191 mg/dL (ref 100–199)
HDL: 70 mg/dL (ref >39)
LDL Chol Calc (NIH): 113 mg/dL — ABNORMAL HIGH (ref 0–99)
Triglycerides: 43 mg/dL (ref 0–149)
VLDL Cholesterol Cal: 8 mg/dL (ref 5–40)

## 2022-10-02 ENCOUNTER — Telehealth: Payer: Self-pay | Admitting: Pharmacist

## 2022-10-02 ENCOUNTER — Encounter: Payer: Self-pay | Admitting: Pharmacist

## 2022-10-02 NOTE — Telephone Encounter (Signed)
Patient reports cramps with rosvuastatin and nexletol. She started alternating what days she took the 2. I advised to take the nexletol daily but the rosuvastatin every other day.  [employer] grant approved as pt was paying $100/month   CARD NO. [id number]   CARD STATUS Active   BIN [id number]   PCN [id number]   PC GROUP [id number]

## 2022-10-15 ENCOUNTER — Other Ambulatory Visit: Payer: Self-pay | Admitting: Pulmonary Disease

## 2022-10-21 DIAGNOSIS — S93401A Sprain of unspecified ligament of right ankle, initial encounter: Secondary | ICD-10-CM | POA: Diagnosis not present

## 2022-10-21 DIAGNOSIS — M25561 Pain in right knee: Secondary | ICD-10-CM | POA: Diagnosis not present

## 2022-10-23 ENCOUNTER — Ambulatory Visit: Payer: Medicare Other | Admitting: Obstetrics and Gynecology

## 2022-10-28 DIAGNOSIS — R200 Anesthesia of skin: Secondary | ICD-10-CM | POA: Diagnosis not present

## 2022-10-28 DIAGNOSIS — I10 Essential (primary) hypertension: Secondary | ICD-10-CM | POA: Diagnosis not present

## 2022-10-28 DIAGNOSIS — Z9181 History of falling: Secondary | ICD-10-CM | POA: Diagnosis not present

## 2022-11-14 ENCOUNTER — Encounter: Payer: Self-pay | Admitting: Neurology

## 2022-11-21 DIAGNOSIS — L570 Actinic keratosis: Secondary | ICD-10-CM | POA: Diagnosis not present

## 2022-11-21 DIAGNOSIS — D485 Neoplasm of uncertain behavior of skin: Secondary | ICD-10-CM | POA: Diagnosis not present

## 2022-11-22 ENCOUNTER — Other Ambulatory Visit: Payer: Self-pay | Admitting: Pulmonary Disease

## 2022-11-28 ENCOUNTER — Telehealth: Payer: Self-pay

## 2022-11-28 NOTE — Telephone Encounter (Signed)
Former JJ pt LVM in triage line stating she is experiencing a "groin pull" like sensation and she is currently on medication for bone density. Requesting a cb w/ recommendations.

## 2022-11-28 NOTE — Telephone Encounter (Signed)
Pt notified and voiced understanding. Routing to provider for final review.

## 2022-11-28 NOTE — Telephone Encounter (Signed)
I recommend an office visit with patient's PCP.

## 2022-12-11 ENCOUNTER — Ambulatory Visit: Payer: Medicare Other | Admitting: Neurology

## 2022-12-18 DIAGNOSIS — H2513 Age-related nuclear cataract, bilateral: Secondary | ICD-10-CM | POA: Diagnosis not present

## 2022-12-18 DIAGNOSIS — H353131 Nonexudative age-related macular degeneration, bilateral, early dry stage: Secondary | ICD-10-CM | POA: Diagnosis not present

## 2022-12-18 DIAGNOSIS — H5213 Myopia, bilateral: Secondary | ICD-10-CM | POA: Diagnosis not present

## 2022-12-19 DIAGNOSIS — I10 Essential (primary) hypertension: Secondary | ICD-10-CM | POA: Diagnosis not present

## 2022-12-20 ENCOUNTER — Encounter: Payer: Self-pay | Admitting: Podiatry

## 2022-12-20 ENCOUNTER — Ambulatory Visit: Payer: Medicare Other | Admitting: Podiatry

## 2022-12-20 VITALS — Ht 62.0 in | Wt 122.2 lb

## 2022-12-20 DIAGNOSIS — M79672 Pain in left foot: Secondary | ICD-10-CM

## 2022-12-20 DIAGNOSIS — M79675 Pain in left toe(s): Secondary | ICD-10-CM | POA: Diagnosis not present

## 2022-12-20 DIAGNOSIS — B351 Tinea unguium: Secondary | ICD-10-CM

## 2022-12-20 DIAGNOSIS — M79674 Pain in right toe(s): Secondary | ICD-10-CM | POA: Diagnosis not present

## 2022-12-20 DIAGNOSIS — Q828 Other specified congenital malformations of skin: Secondary | ICD-10-CM

## 2022-12-20 DIAGNOSIS — M79671 Pain in right foot: Secondary | ICD-10-CM | POA: Diagnosis not present

## 2022-12-28 NOTE — Progress Notes (Signed)
  Subjective:  Patient ID: Diana Cisneros, female    DOB: 06-22-1945,  MRN: [id number]  Diana Cisneros presents to clinic today for: painful porokeratotic lesion(s) b/l feet and painful mycotic toenails that limit ambulation. Painful toenails interfere with ambulation. Aggravating factors include wearing enclosed shoe gear. Pain is relieved with periodic professional debridement. Painful porokeratotic lesions are aggravated when weightbearing with and without shoegear. Pain is relieved with periodic professional debridement.  Chief Complaint  Patient presents with   Nail Problem    PT is here for RFC, not a diabetis, PCP is Dr Nehemiah Settle and LOV was yesterday.    PCP is Renford Dills, MD.  Allergies  Allergen Reactions   Morphine And Codeine Hives   Morphine Sulfate     Other reaction(s): itching/redface   Other     Other reaction(s): rash/lips swelling   Zetia [Ezetimibe]     Muscle pain and leg cramps on 10mg  daily, 5mg  daily, and 5mg  every other day   Naproxen Sodium Rash    Review of Systems: Negative except as noted in the HPI.  Objective: No changes noted in today's physical examination. There were no vitals filed for this visit.  Diana Cisneros is a pleasant 77 y.o. female in NAD. AAO x 3.  Vascular Examination: Capillary refill time <3 seconds b/l LE. Palpable pedal pulses b/l LE. Digital hair present b/l. No pedal edema b/l. Skin temperature gradient WNL b/l. No varicosities b/l. Marland Kitchen  Dermatological Examination: Pedal skin with normal turgor, texture and tone b/l. No open wounds. No interdigital macerations b/l. Toenails 1-5 b/l thickened, discolored, dystrophic with subungual debris. There is pain on palpation to dorsal aspect of nailplates. Porokeratotic lesion(s) submet head 2 left foot and submet head 2 right foot. No erythema, no edema, no drainage, no fluctuance..  Neurological Examination: Protective sensation intact with 10 gram monofilament b/l LE. Vibratory sensation  intact b/l LE.   Musculoskeletal Examination: HAV with bunion deformity noted b/l LE. Hammertoe(s) noted to the bilateral 2nd toes and bilateral 5th toes.  Assessment/Plan: 1. Pain due to onychomycosis of toenails of both feet   2. Porokeratosis   3. Pain in both feet     -Patient was evaluated today. All questions/concerns addressed on today's visit. -Continue supportive shoe gear daily. -Mycotic toenails 1-5 bilaterally were debrided in length and girth with sterile nail nippers and dremel without incident. -Porokeratotic lesion(s) submet head 2 b/l pared and enucleated with sterile currette without incident. Total number of lesions debrided=2. -Patient/POA to call should there be question/concern in the interim.   Return in about 3 months (around 03/22/2023).  Freddie Breech, DPM      [place] LOCATION: [address] [place] [address]                   Office [phone]   [facility] LOCATION: [address] [place], [place] [address] Office [phone]

## 2022-12-30 ENCOUNTER — Ambulatory Visit: Payer: Medicare Other | Admitting: Neurology

## 2023-01-10 DIAGNOSIS — H353131 Nonexudative age-related macular degeneration, bilateral, early dry stage: Secondary | ICD-10-CM | POA: Diagnosis not present

## 2023-01-10 DIAGNOSIS — H2513 Age-related nuclear cataract, bilateral: Secondary | ICD-10-CM | POA: Diagnosis not present

## 2023-01-13 DIAGNOSIS — Z23 Encounter for immunization: Secondary | ICD-10-CM | POA: Diagnosis not present

## 2023-01-13 DIAGNOSIS — Z0000 Encounter for general adult medical examination without abnormal findings: Secondary | ICD-10-CM | POA: Diagnosis not present

## 2023-01-20 DIAGNOSIS — H2512 Age-related nuclear cataract, left eye: Secondary | ICD-10-CM | POA: Diagnosis not present

## 2023-01-20 DIAGNOSIS — H25812 Combined forms of age-related cataract, left eye: Secondary | ICD-10-CM | POA: Diagnosis not present

## 2023-01-28 DIAGNOSIS — L905 Scar conditions and fibrosis of skin: Secondary | ICD-10-CM | POA: Diagnosis not present

## 2023-01-28 DIAGNOSIS — L738 Other specified follicular disorders: Secondary | ICD-10-CM | POA: Diagnosis not present

## 2023-01-28 DIAGNOSIS — L814 Other melanin hyperpigmentation: Secondary | ICD-10-CM | POA: Diagnosis not present

## 2023-01-28 DIAGNOSIS — Z85828 Personal history of other malignant neoplasm of skin: Secondary | ICD-10-CM | POA: Diagnosis not present

## 2023-01-28 DIAGNOSIS — L720 Epidermal cyst: Secondary | ICD-10-CM | POA: Diagnosis not present

## 2023-02-03 ENCOUNTER — Ambulatory Visit: Payer: Medicare Other | Admitting: Neurology

## 2023-02-06 DIAGNOSIS — H25811 Combined forms of age-related cataract, right eye: Secondary | ICD-10-CM | POA: Diagnosis not present

## 2023-02-06 DIAGNOSIS — H2511 Age-related nuclear cataract, right eye: Secondary | ICD-10-CM | POA: Diagnosis not present

## 2023-02-07 DIAGNOSIS — M25559 Pain in unspecified hip: Secondary | ICD-10-CM | POA: Diagnosis not present

## 2023-02-07 DIAGNOSIS — I700 Atherosclerosis of aorta: Secondary | ICD-10-CM | POA: Diagnosis not present

## 2023-02-07 DIAGNOSIS — Z0000 Encounter for general adult medical examination without abnormal findings: Secondary | ICD-10-CM | POA: Diagnosis not present

## 2023-02-07 DIAGNOSIS — E7800 Pure hypercholesterolemia, unspecified: Secondary | ICD-10-CM | POA: Diagnosis not present

## 2023-02-07 DIAGNOSIS — Z5181 Encounter for therapeutic drug level monitoring: Secondary | ICD-10-CM | POA: Diagnosis not present

## 2023-02-07 DIAGNOSIS — I10 Essential (primary) hypertension: Secondary | ICD-10-CM | POA: Diagnosis not present

## 2023-02-11 DIAGNOSIS — M1611 Unilateral primary osteoarthritis, right hip: Secondary | ICD-10-CM | POA: Diagnosis not present

## 2023-02-11 DIAGNOSIS — M25512 Pain in left shoulder: Secondary | ICD-10-CM | POA: Diagnosis not present

## 2023-02-18 ENCOUNTER — Encounter: Payer: Medicare Other | Admitting: Obstetrics and Gynecology

## 2023-02-20 ENCOUNTER — Other Ambulatory Visit: Payer: Self-pay | Admitting: Pharmacist

## 2023-02-20 ENCOUNTER — Encounter: Payer: Self-pay | Admitting: Pharmacist

## 2023-02-20 DIAGNOSIS — Z5181 Encounter for therapeutic drug level monitoring: Secondary | ICD-10-CM

## 2023-02-20 DIAGNOSIS — E782 Mixed hyperlipidemia: Secondary | ICD-10-CM

## 2023-03-07 ENCOUNTER — Ambulatory Visit
Admission: RE | Admit: 2023-03-07 | Discharge: 2023-03-07 | Disposition: A | Discharge status: Home / Self Care | Payer: Medicare Other | Source: Ambulatory Visit | Attending: Obstetrics and Gynecology | Admitting: Obstetrics and Gynecology

## 2023-03-07 DIAGNOSIS — M810 Age-related osteoporosis without current pathological fracture: Secondary | ICD-10-CM

## 2023-03-07 DIAGNOSIS — M8588 Other specified disorders of bone density and structure, other site: Secondary | ICD-10-CM | POA: Diagnosis not present

## 2023-03-09 ENCOUNTER — Encounter: Payer: Self-pay | Admitting: Obstetrics and Gynecology

## 2023-03-10 ENCOUNTER — Encounter: Payer: Self-pay | Admitting: Obstetrics and Gynecology

## 2023-03-12 ENCOUNTER — Encounter: Payer: Medicare Other | Admitting: Obstetrics and Gynecology

## 2023-03-18 ENCOUNTER — Other Ambulatory Visit: Payer: Self-pay | Admitting: Pulmonary Disease

## 2023-03-19 ENCOUNTER — Encounter: Payer: Self-pay | Admitting: Podiatry

## 2023-03-19 ENCOUNTER — Ambulatory Visit: Payer: Medicare Other | Admitting: Podiatry

## 2023-03-19 VITALS — Ht 62.0 in | Wt 122.0 lb

## 2023-03-19 DIAGNOSIS — M79672 Pain in left foot: Secondary | ICD-10-CM | POA: Diagnosis not present

## 2023-03-19 DIAGNOSIS — M79671 Pain in right foot: Secondary | ICD-10-CM | POA: Diagnosis not present

## 2023-03-19 DIAGNOSIS — M79674 Pain in right toe(s): Secondary | ICD-10-CM | POA: Diagnosis not present

## 2023-03-19 DIAGNOSIS — Q828 Other specified congenital malformations of skin: Secondary | ICD-10-CM

## 2023-03-19 DIAGNOSIS — M79675 Pain in left toe(s): Secondary | ICD-10-CM | POA: Diagnosis not present

## 2023-03-19 DIAGNOSIS — B351 Tinea unguium: Secondary | ICD-10-CM | POA: Diagnosis not present

## 2023-03-19 NOTE — Progress Notes (Signed)
  Subjective:  Patient ID: Diana Cisneros, female    DOB: 04-21-1945,  MRN: [id number]  78 y.o. female presents painful porokeratotic lesion(s) left foot and right foot and painful mycotic toenails that limit ambulation. Painful toenails interfere with ambulation. Aggravating factors include wearing enclosed shoe gear. Pain is relieved with periodic professional debridement. Painful porokeratotic lesions are aggravated when weightbearing with and without shoegear. Pain is relieved with periodic professional debridement. Chief Complaint  Patient presents with   RFC   New problem(s): None   PCP is Renford Dills, MD  Allergies  Allergen Reactions   Morphine And Codeine Hives   Other Swelling    Other reaction(s): rash/lips swelling   Morphine Sulfate Other (See Comments)    Other reaction(s): itching/redface   Naproxen Sodium Rash   Zetia [Ezetimibe]     Muscle pain and leg cramps on 10mg  daily, 5mg  daily, and 5mg  every other day    Review of Systems: Negative except as noted in the HPI.   Objective:  Diana Cisneros is a pleasant 78 y.o. female WD, WN in NAD. AAO x 3.  Vascular Examination: Vascular status intact b/l with palpable pedal pulses. CFT immediate b/l. Pedal hair present. No edema. No pain with calf compression b/l. Skin temperature gradient WNL b/l. No varicosities noted. No cyanosis or clubbing noted.  Neurological Examination: Sensation grossly intact b/l with 10 gram monofilament. Vibratory sensation intact b/l.  Dermatological Examination: Pedal skin with normal turgor, texture and tone b/l. No open wounds nor interdigital macerations noted. Toenails 1-5 b/l thick, discolored, elongated with subungual debris and pain on dorsal palpation. Porokeratotic lesion(s) submet head 2 b/l. No erythema, no edema, no drainage, no fluctuance.   Musculoskeletal Examination: Muscle strength 5/5 to b/l LE.  No pain, crepitus noted b/l. HAV with bunion deformity noted b/l LE.  Hammertoe(s) noted to the left second digit, left fifth digit, right second digit, and right fifth digit. Patient ambulates independently without assistive aids.   Radiographs: None  Last A1c:       No data to display           Assessment:   1. Pain due to onychomycosis of toenails of both feet   2. Porokeratosis   3. Pain in both feet    Plan:  Patient was evaluated and treated. All patient's and/or POA's questions/concerns addressed on today's visit. Toenails 1-5 debrided in length and girth without incident. Porokeratotic lesion(s) submet head 2 b/l pared with sharp debridement without incident. Continue soft, supportive shoe gear daily. Report any pedal injuries to medical professional. Call office if there are any questions/concerns. -Patient/POA to call should there be question/concern in the interim.  Return in about 3 months (around 06/16/2023).  Freddie Breech, DPM      [place] LOCATION: [address] [place] [address]                   Office [phone]   [facility] LOCATION: [address] [place], [place] [address] Office [phone]

## 2023-03-21 ENCOUNTER — Ambulatory Visit (INDEPENDENT_AMBULATORY_CARE_PROVIDER_SITE_OTHER): Payer: Medicare Other | Admitting: Obstetrics and Gynecology

## 2023-03-21 ENCOUNTER — Encounter: Payer: Self-pay | Admitting: Obstetrics and Gynecology

## 2023-03-21 ENCOUNTER — Other Ambulatory Visit (HOSPITAL_COMMUNITY)
Admission: RE | Admit: 2023-03-21 | Discharge: 2023-03-21 | Disposition: A | Discharge status: Home / Self Care | Payer: Medicare Other | Source: Ambulatory Visit | Attending: Obstetrics and Gynecology | Admitting: Obstetrics and Gynecology

## 2023-03-21 VITALS — BP 144/86 | HR 84 | Ht 60.75 in | Wt 123.0 lb

## 2023-03-21 DIAGNOSIS — R350 Frequency of micturition: Secondary | ICD-10-CM

## 2023-03-21 DIAGNOSIS — E2839 Other primary ovarian failure: Secondary | ICD-10-CM

## 2023-03-21 DIAGNOSIS — M810 Age-related osteoporosis without current pathological fracture: Secondary | ICD-10-CM

## 2023-03-21 DIAGNOSIS — Z01419 Encounter for gynecological examination (general) (routine) without abnormal findings: Secondary | ICD-10-CM

## 2023-03-21 DIAGNOSIS — Z1151 Encounter for screening for human papillomavirus (HPV): Secondary | ICD-10-CM | POA: Insufficient documentation

## 2023-03-21 DIAGNOSIS — Z9289 Personal history of other medical treatment: Secondary | ICD-10-CM

## 2023-03-21 DIAGNOSIS — Z8742 Personal history of other diseases of the female genital tract: Secondary | ICD-10-CM

## 2023-03-21 DIAGNOSIS — Z124 Encounter for screening for malignant neoplasm of cervix: Secondary | ICD-10-CM | POA: Diagnosis not present

## 2023-03-21 DIAGNOSIS — Z9189 Other specified personal risk factors, not elsewhere classified: Secondary | ICD-10-CM | POA: Insufficient documentation

## 2023-03-21 DIAGNOSIS — N952 Postmenopausal atrophic vaginitis: Secondary | ICD-10-CM

## 2023-03-21 LAB — URINALYSIS, COMPLETE W/RFL CULTURE
Bacteria, UA: NONE SEEN /[HPF]
Bilirubin Urine: NEGATIVE
Glucose, UA: NEGATIVE
Hgb urine dipstick: NEGATIVE
Hyaline Cast: NONE SEEN /[LPF]
Ketones, ur: NEGATIVE
Leukocyte Esterase: NEGATIVE
Nitrites, Initial: NEGATIVE
Protein, ur: NEGATIVE
RBC / HPF: NONE SEEN /[HPF] (ref 0–2)
Specific Gravity, Urine: 1.015 (ref 1.001–1.035)
WBC, UA: NONE SEEN /[HPF] (ref 0–5)
pH: 7 (ref 5.0–8.0)

## 2023-03-21 LAB — NO CULTURE INDICATED

## 2023-03-21 NOTE — Progress Notes (Signed)
 78 y.o. y.o. female here for medicare gyn breast and pelvic No LMP recorded. Patient has had a hysterectomy.      H/O hysterectomy. Not sexually active.   Ovaries removed Having some occasional sharp LLQ pain  She started on Fosamax in 1/23, diagnosed with osteoporosis in 11/22. She is tolerating the fosamax okay. She exercises, she gets calcium and vit d.   Desires UA since having more Urinary frequency.   No bowel issues.   No LMP recorded. Patient has had a hysterectomy.          Sexually active: No.  The current method of family planning is status post hysterectomy.    Exercising: Yes.    , walking daily (over 14,000 steps a day).  Smoker:  no  Health Maintenance: Pap:  05/10/10 WNL  History of abnormal Pap:  no MMG:  09/04/21 density B Bi-rads 1 neg  BMD:   11/28/20 T score -2.4, Frax 20/11%, 03/07/23 -.1.9 on fosamax 1/23 Colonoscopy: yes ~5-6 years ago. She was told she did not need any more.  TDaP:  08/08/17  Gardasil: none  Body mass index is 23.43 kg/m.      No data to display          Height 5' 0.75" (1.543 m), weight 123 lb (55.8 kg).  No results found for: "DIAGPAP", "HPVHIGH", "ADEQPAP"  GYN HISTORY: No results found for: "DIAGPAP", "HPVHIGH", "ADEQPAP"  OB History  Gravida Para Term Preterm AB Living  0       SAB IAB Ectopic Multiple Live Births          Past Medical History:  Diagnosis Date   Allergic rhinitis    Anxiety    Anxiousness    Degenerative disc disease    thoracic,lumbar   Hypercholesterolemia    Hyperlipidemia    Low back pain    Moderate persistent asthma without complication 02/25/2018   Muscle cramps    Nail dystrophy 12/30/2018   Osteopenia 10/2018   T score -2.2 distal third of forearm.  -1.6 left femoral neck FRAX 18% / 6.7%   Seasonal allergies    Sinusitis, acute    URI (upper respiratory infection)     Past Surgical History:  Procedure Laterality Date   ABDOMINAL HYSTERECTOMY  1986   TAH  LSO    APPENDECTOMY  2009   BREAST EXCISIONAL BIOPSY Left 1995   BREAST SURGERY     Biopsy benign   COLONOSCOPY WITH PROPOFOL N/A 12/27/2014   Procedure: COLONOSCOPY WITH PROPOFOL;  Surgeon: Charolett Bumpers, MD;  Location: [facility];  Service: Endoscopy;  Laterality: N/A;   KNEE SURGERY     arthroscopic   OOPHORECTOMY     LSO 86,RSO 95   PELVIC LAPAROSCOPY  1995   RSO    Current Outpatient Medications on File Prior to Visit  Medication Sig Dispense Refill   alendronate (FOSAMAX) 70 MG tablet Take 1 tablet (70 mg total) by mouth every 7 (seven) days. Take first thing in am with 6 oz. Water.  Be upright after taking.  Eat nothing for one hour. 12 tablet 3   Ascorbic Acid (VITAMIN C) 100 MG tablet Take 100 mg by mouth daily.       augmented betamethasone dipropionate (DIPROLENE-AF) 0.05 % cream APPLY TO AFFECTED AREA AS NEEDED 50 g 0   Bempedoic Acid (NEXLETOL) 180 MG TABS Take 1 tablet by mouth every other day.     benzonatate (TESSALON) 100 MG capsule Take 100  mg by mouth 3 (three) times daily as needed.     BREO ELLIPTA 200-25 MCG/ACT AEPB INHALE 1 PUFF BY MOUTH EVERY DAY 60 each 5   Calcium Carbonate+Vitamin D 600-200 MG-UNIT TABS 1 tablet     Calcium Carbonate-Vitamin D (CALCIUM + D PO) Take 1 tablet by mouth daily.      chlorpheniramine (CHLOR-TRIMETON) 4 MG tablet Take 4 mg by mouth 2 (two) times daily as needed for allergies.     darifenacin (ENABLEX) 7.5 MG 24 hr tablet Take 1 tablet (7.5 mg total) by mouth daily. ONE PO QD 30 tablet 1   diazepam (VALIUM) 5 MG tablet Take 5 mg by mouth daily as needed.  3   diclofenac sodium (VOLTAREN) 1 % GEL APPLY 2 4 GRAMS 3 TO 4 TIMES DAILY     ezetimibe (ZETIA) 10 MG tablet Take 10 mg by mouth daily.     fluticasone (CUTIVATE) 0.005 % ointment SMARTSIG:Sparingly Topical Twice Daily     gabapentin (NEURONTIN) 100 MG capsule Take 100-300 mg by mouth at bedtime as needed (pain or insomnia).     Glucosamine-Chondroit-Vit C-Mn (GLUCOSAMINE CHONDR  1500 COMPLX PO) See admin instructions.     glucosamine-chondroitin 500-400 MG tablet Take 1 tablet by mouth daily.       loratadine (CLARITIN) 10 MG tablet Take 1 tablet (10 mg total) by mouth daily. 30 tablet 5   losartan (COZAAR) 25 MG tablet Take 25 mg by mouth daily.     Magnesium 300 MG CAPS See admin instructions.     meloxicam (MOBIC) 15 MG tablet Take 7.5 mg by mouth daily.  2   methocarbamol (ROBAXIN) 750 MG tablet Take 750 mg by mouth every 8 (eight) hours as needed.     Misc Natural Products (OSTEO BI-FLEX JOINT SHIELD PO) See admin instructions.     omeprazole (PRILOSEC) 20 MG capsule TAKE 1 CAPSULE BY MOUTH AT BEDTIME. 90 capsule 1   ondansetron (ZOFRAN) 4 MG tablet Take 1 tablet (4 mg total) by mouth every 8 (eight) hours as needed for nausea or vomiting. 20 tablet 0   pantoprazole (PROTONIX) 40 MG tablet Take 1 tablet (40 mg total) by mouth daily. 7 tablet 0   Potassium 75 MG TABS See admin instructions.     rosuvastatin (CRESTOR) 20 MG tablet Take 1 tablet (20 mg total) by mouth daily. 90 tablet 3   Specialty Vitamins Products (MAGNESIUM, AMINO ACID CHELATE,) 133 MG tablet Take 1 tablet by mouth daily.       SUPER B COMPLEX/C PO See admin instructions.     traMADol (ULTRAM) 50 MG tablet TAKE 1 TO 2 TABLETS BY MOUTH 2 TO 3 TIMES DAILY AS NEEDED     vitamin B-12 (CYANOCOBALAMIN) 100 MCG tablet 1 tablet     VITAMIN D, CHOLECALCIFEROL, PO Take 1 tablet by mouth daily.      No current facility-administered medications on file prior to visit.    Social History   Socioeconomic History   Marital status: Married    Spouse name: Not on file   Number of children: Not on file   Years of education: Not on file   Highest education level: Not on file  Occupational History   Not on file  Tobacco Use   Smoking status: Former    Current packs/day: 0.00    Types: Cigarettes    Quit date: 17    Years since quitting: 60.1    Passive exposure: Past   Smokeless tobacco: Never  Tobacco comments:    one pack of cigarettes would last two weeks-10/29/17  Vaping Use   Vaping status: Never Used  Substance and Sexual Activity   Alcohol use: Not Currently    Comment: occ   Drug use: No   Sexual activity: Not Currently    Birth control/protection: Post-menopausal, Surgical    Comment: Hysterectomy, 1st intercourse 78 yo-More than 5 partners, No hx of STI/STD, No hx of Br Ca, No abn pap hx  Other Topics Concern   Not on file  Social History Narrative   Not on file   Social Drivers of Health   Financial Resource Strain: Not on file  Food Insecurity: Not on file  Transportation Needs: Not on file  Physical Activity: Not on file  Stress: Not on file  Social Connections: Not on file  Intimate Partner Violence: Not on file    Family History  Problem Relation Age of Onset   Heart disease Mother    Stroke Father    Diabetes Maternal Grandmother    Heart disease Maternal Grandmother    Diabetes Maternal Grandfather    Cancer Maternal Grandfather        Colon   Breast cancer Paternal Grandmother        Age 62     Allergies  Allergen Reactions   Morphine And Codeine Hives   Other Swelling    Other reaction(s): rash/lips swelling   Morphine Sulfate Other (See Comments)    Other reaction(s): itching/redface   Naproxen Sodium Rash   Zetia [Ezetimibe]     Muscle pain and leg cramps on 10mg  daily, 5mg  daily, and 5mg  every other day      Patient's last menstrual period was No LMP recorded. Patient has had a hysterectomy..           Review of Systems Alls systems reviewed and are negative.     Physical Exam Constitutional:      General: She is awake.     Appearance: Normal appearance.  Genitourinary:     Vulva and urethral meatus normal.     No lesions in the vagina.     Right Labia: No rash, lesions or skin changes.    Left Labia: No lesions, skin changes or rash.    No vaginal discharge or tenderness.     No vaginal prolapse present.     Moderate vaginal atrophy present.     Right Adnexa: absent.    Left Adnexa: absent.    Cervix is absent.     No cervical discharge.     Uterus is absent.  Neurological:     Mental Status: She is alert.  Psychiatric:        Behavior: Behavior is cooperative.  Vitals and nursing note reviewed. Exam conducted with a chaperone present.    Cornelia Copa, MA present for exam   A:        Medicare gyn exam                             P:        Pap smear collected today  Patient desired to have this completed today Encouraged annual mammogram screening Colon cancer screening aged out DXA ordered today Patient would like to repeat in one year.  Would like to begin evenity and prolia.  Discussed costs and medicare will usually not cover. She will continue fosamax.  Discussed she will need to change to another  medication in 5 years. Does not like the inconvenience of the fosamax, but will continue for now. Labs and immunizations to do with PMD Discussed breast self exams Encouraged healthy lifestyle practices Encouraged Vit D and Calcium  Urgency: UA negative today  No follow-ups on file.  Earley Favor

## 2023-03-24 DIAGNOSIS — H6123 Impacted cerumen, bilateral: Secondary | ICD-10-CM | POA: Diagnosis not present

## 2023-03-24 LAB — CYTOLOGY - PAP
Comment: NEGATIVE
Diagnosis: NEGATIVE
High risk HPV: NEGATIVE

## 2023-03-25 ENCOUNTER — Encounter: Payer: Self-pay | Admitting: Obstetrics and Gynecology

## 2023-04-08 DIAGNOSIS — S61202A Unspecified open wound of right middle finger without damage to nail, initial encounter: Secondary | ICD-10-CM | POA: Diagnosis not present

## 2023-04-09 DIAGNOSIS — H6122 Impacted cerumen, left ear: Secondary | ICD-10-CM | POA: Diagnosis not present

## 2023-04-10 DIAGNOSIS — M1711 Unilateral primary osteoarthritis, right knee: Secondary | ICD-10-CM | POA: Diagnosis not present

## 2023-04-23 ENCOUNTER — Telehealth: Payer: Self-pay | Admitting: *Deleted

## 2023-04-23 DIAGNOSIS — M810 Age-related osteoporosis without current pathological fracture: Secondary | ICD-10-CM

## 2023-04-23 NOTE — Telephone Encounter (Signed)
 Routing to Dr. Karma Greaser to review and advise on alternatives to [facility] as medication is not covered by her insurance.

## 2023-04-23 NOTE — Telephone Encounter (Signed)
 Diana Cisneros [facility] (supporting Earley Favor, MD)5 days ago    I left a message on your phone the other day and mentioned that the insurance does not cover Evenity.  Is there another monthly medicine that is an alternative I can ask them about?    You  Diana Guan Fama8 days ago    Andee Lineman,    The name of the medication is Sheilah Mins (code for insurance is [phone]) that Dr. Karma Greaser is wanting to prescribe. My direct line is [phone]. Once you speak with [facility], you can contact me if you desire to proceed with the injections.   Thank you,  Irving Burton, RN     Diana Cisneros [facility] (supporting Earley Favor, MD)12 days ago    Before you order it, let me check with the insurance company, please.  I just need the name of it so I can ask them.    Earley Favor, MD  Diana Guan Fama12 days ago    I will send to [facility] to check.  She will be here Monday Have a great weekend.  I will try to place the order for it. Dr. Karma Greaser

## 2023-04-28 DIAGNOSIS — H61899 Other specified disorders of external ear, unspecified ear: Secondary | ICD-10-CM | POA: Diagnosis not present

## 2023-04-28 DIAGNOSIS — H9192 Unspecified hearing loss, left ear: Secondary | ICD-10-CM | POA: Diagnosis not present

## 2023-05-21 DIAGNOSIS — L700 Acne vulgaris: Secondary | ICD-10-CM | POA: Diagnosis not present

## 2023-05-21 DIAGNOSIS — D2262 Melanocytic nevi of left upper limb, including shoulder: Secondary | ICD-10-CM | POA: Diagnosis not present

## 2023-05-21 DIAGNOSIS — D1801 Hemangioma of skin and subcutaneous tissue: Secondary | ICD-10-CM | POA: Diagnosis not present

## 2023-05-21 DIAGNOSIS — D225 Melanocytic nevi of trunk: Secondary | ICD-10-CM | POA: Diagnosis not present

## 2023-05-21 DIAGNOSIS — Z85828 Personal history of other malignant neoplasm of skin: Secondary | ICD-10-CM | POA: Diagnosis not present

## 2023-05-21 DIAGNOSIS — D2271 Melanocytic nevi of right lower limb, including hip: Secondary | ICD-10-CM | POA: Diagnosis not present

## 2023-05-21 DIAGNOSIS — B078 Other viral warts: Secondary | ICD-10-CM | POA: Diagnosis not present

## 2023-05-21 DIAGNOSIS — L720 Epidermal cyst: Secondary | ICD-10-CM | POA: Diagnosis not present

## 2023-05-21 DIAGNOSIS — L821 Other seborrheic keratosis: Secondary | ICD-10-CM | POA: Diagnosis not present

## 2023-05-25 ENCOUNTER — Other Ambulatory Visit: Payer: Self-pay | Admitting: Pulmonary Disease

## 2023-05-27 ENCOUNTER — Other Ambulatory Visit: Payer: Self-pay | Admitting: Cardiovascular Disease

## 2023-05-27 ENCOUNTER — Other Ambulatory Visit: Payer: Self-pay | Admitting: Internal Medicine

## 2023-05-27 MED ORDER — FLUTICASONE FUROATE-VILANTEROL 200-25 MCG/ACT IN AEPB: 1.0000 | INHALATION_SPRAY | Freq: Every day | RESPIRATORY_TRACT | 0 refills | Status: DC

## 2023-05-27 NOTE — Telephone Encounter (Signed)
Insurance information submitted to [employer] portal. Will await summary of benefits for prolia.    

## 2023-05-27 NOTE — Telephone Encounter (Signed)
 Copied from CRM [id number]. Topic: Clinical - Medication Refill >> May 27, 2023  7:51 AM Juliana Ocean wrote: Most Recent Primary Care Visit:   Medication:  BREO ELLIPTA  200-25 MCG/ACT AEPB    Has the patient contacted their pharmacy? Yes The pharmacy advised pt to call  her dr.  Pt had not been seen in over a year, so I advised pt to make appt and she did. First available July 30.  Can you fill this until she comes in? Is this the correct pharmacy for this prescription? Yes If no, delete pharmacy and type the correct one.  This is the patient's preferred pharmacy:  [id number] [place]ette Nestle, [place] - [address] [place] [address] Phone: [phone] Fax: [phone]   Has the prescription been filled recently? Yes  Is the patient out of the medication? Yes  Has the patient been seen for an appointment in the last year OR does the patient have an upcoming appointment? Yes  Can we respond through MyChart? Yes  Agent: Please be advised that Rx refills may take up to 3 business days. We ask that you follow-up with your pharmacy.

## 2023-06-04 NOTE — Telephone Encounter (Signed)
 PA for prolia submitted via [employer] portal and is pending. Request pending Auth #: [id number]. Will await response.

## 2023-06-06 NOTE — Telephone Encounter (Signed)
 Diana Cisneros #: [id number] with correct dosing is pending. Will await response.

## 2023-06-11 ENCOUNTER — Ambulatory Visit: Payer: Medicare Other | Admitting: Podiatry

## 2023-06-11 ENCOUNTER — Encounter: Payer: Self-pay | Admitting: Podiatry

## 2023-06-11 DIAGNOSIS — M79674 Pain in right toe(s): Secondary | ICD-10-CM | POA: Diagnosis not present

## 2023-06-11 DIAGNOSIS — M79672 Pain in left foot: Secondary | ICD-10-CM | POA: Diagnosis not present

## 2023-06-11 DIAGNOSIS — M79671 Pain in right foot: Secondary | ICD-10-CM

## 2023-06-11 DIAGNOSIS — B351 Tinea unguium: Secondary | ICD-10-CM

## 2023-06-11 DIAGNOSIS — Q828 Other specified congenital malformations of skin: Secondary | ICD-10-CM

## 2023-06-11 DIAGNOSIS — M79675 Pain in left toe(s): Secondary | ICD-10-CM | POA: Diagnosis not present

## 2023-06-15 NOTE — Progress Notes (Signed)
  Subjective:  Patient ID: Diana Cisneros, female    DOB: 12-28-1945,  MRN: [id number]  Diana Cisneros presents to clinic today for painful porokeratotic lesion(s) of both feet and painful mycotic toenails that limit ambulation. Painful toenails interfere with ambulation. Aggravating factors include wearing enclosed shoe gear. Pain is relieved with periodic professional debridement. Painful porokeratotic lesions are aggravated when weightbearing with and without shoegear. Pain is relieved with periodic professional debridement.  Chief Complaint  Patient presents with   Nail Problem   New problem(s): None.   PCP is Merl Star, MD.  Allergies  Allergen Reactions   Morphine And Codeine Hives   Other Swelling    Other reaction(s): rash/lips swelling   Morphine Sulfate Other (See Comments)    Other reaction(s): itching/redface   Naproxen Sodium Rash   Zetia  [Ezetimibe ]     Muscle pain and leg cramps on 10mg  daily, 5mg  daily, and 5mg  every other day    Review of Systems: Negative except as noted in the HPI.  Objective: No changes noted in today's physical examination. There were no vitals filed for this visit. Diana Cisneros is a pleasant 78 y.o. female WD, WN in NAD. AAO x 3.  Vascular Examination: Capillary refill time immediate b/l. Vascular status intact b/l with palpable pedal pulses. Pedal hair present b/l. No pain with calf compression b/l. Skin temperature gradient WNL b/l. No cyanosis or clubbing b/l. No ischemia or gangrene noted b/l.   Neurological Examination: Sensation grossly intact b/l with 10 gram monofilament.  Dermatological Examination: Pedal skin with normal turgor, texture and tone b/l.  No open wounds. No interdigital macerations.   Toenails 1-5 b/l thick, discolored, elongated with subungual debris and pain on dorsal palpation.   Porokeratotic lesion(s) submet head 5 left foot and submet head 5 right foot. No erythema, no edema, no drainage, no  fluctuance.  Musculoskeletal Examination: Muscle strength 5/5 to all lower extremity muscle groups bilaterally. HAV with bunion deformity noted b/l LE. Hammertoe(s) bilateral 2nd toes and bilateral 5th toes.  Radiographs: None  Last A1c:       No data to display           Assessment/Plan: 1. Pain due to onychomycosis of toenails of both feet   2. Porokeratosis   3. Pain in both feet   Consent given for treatment. Patient examined. All patient's and/or POA's questions/concerns addressed on today's visit. Mycotic toenails 1-5 debrided in length and girth without incident. Porokeratotic lesion(s) submet head 2 b/l pared and enucleated with sharp debridement without incident.Continue daily foot inspections and monitor blood glucose per PCP/Endocrinologist's recommendations. Continue soft, supportive shoe gear daily. Report any pedal injuries to medical professional. Call office if there are any quesitons/concerns. -Patient/POA to call should there be question/concern in the interim.   No follow-ups on file.  Luella Sager, DPM      [place] LOCATION: [address] [place] [address]                   Office [phone]   [facility] LOCATION: [address] [place], [place] [address] Office [phone]

## 2023-06-16 ENCOUNTER — Other Ambulatory Visit: Payer: Self-pay | Admitting: Pulmonary Disease

## 2023-06-17 NOTE — Telephone Encounter (Signed)
 [id number] new auth # resubmitted after review of [employer] portal showed original prolia PA withdrawn.

## 2023-06-17 NOTE — Telephone Encounter (Signed)
 Call to patient. Patient states she is currently taking Fosamax . Patient states she got a letter stating prolia denied by [facility]. RN reviewed in [facility] portal and PA for prolia was withdrawn. Please advise. Does patient need to wait 6 months to transition to prolia?  Patient also states she is going for a second opinion with Dr. Bernard Brick at [facility] for possible knee surgery. Patient states she does not want to proceed with knee surgery until she takes care her of bones, but wanted Dr. Tia Flowers to be aware. RN advised would review with Dr. Tia Flowers and return call with recommendations. Patient agreeable.

## 2023-06-18 ENCOUNTER — Encounter: Payer: Self-pay | Admitting: *Deleted

## 2023-06-18 DIAGNOSIS — M1711 Unilateral primary osteoarthritis, right knee: Secondary | ICD-10-CM | POA: Diagnosis not present

## 2023-06-18 NOTE — Telephone Encounter (Signed)
 Call to [facility], spoke with [place], representative. States the PA for prolia was denied and will need to be appealed. Reference #: [id number]. Will fax a copy of denial to our office with guidelines for submitting an appeal. Fax number provided to [place].

## 2023-06-18 NOTE — Telephone Encounter (Signed)
 Appeal letter signed by Dr. Tia Flowers and faxed to [facility] Department at [phone]. Will await response.

## 2023-06-23 ENCOUNTER — Other Ambulatory Visit: Payer: Self-pay | Admitting: Cardiovascular Disease

## 2023-06-24 MED ORDER — DENOSUMAB 60 MG/ML ~~LOC~~ SOSY
60.0000 mg | PREFILLED_SYRINGE | Freq: Once | SUBCUTANEOUS | Status: AC
  Administered 2023-07-16: 60 mg via SUBCUTANEOUS

## 2023-06-24 NOTE — Telephone Encounter (Signed)
 PA for prolia approved. Encounter closed. See referral.

## 2023-06-27 ENCOUNTER — Other Ambulatory Visit: Payer: Self-pay | Admitting: Pulmonary Disease

## 2023-07-05 ENCOUNTER — Other Ambulatory Visit: Payer: Self-pay | Admitting: Cardiovascular Disease

## 2023-07-14 ENCOUNTER — Telehealth: Payer: Self-pay | Admitting: Obstetrics and Gynecology

## 2023-07-14 DIAGNOSIS — E2839 Other primary ovarian failure: Secondary | ICD-10-CM

## 2023-07-14 NOTE — Telephone Encounter (Signed)
 Bone scan referral

## 2023-07-16 ENCOUNTER — Ambulatory Visit (INDEPENDENT_AMBULATORY_CARE_PROVIDER_SITE_OTHER)

## 2023-07-16 ENCOUNTER — Other Ambulatory Visit: Payer: Self-pay | Admitting: Cardiovascular Disease

## 2023-07-16 DIAGNOSIS — J029 Acute pharyngitis, unspecified: Secondary | ICD-10-CM | POA: Diagnosis not present

## 2023-07-16 DIAGNOSIS — M810 Age-related osteoporosis without current pathological fracture: Secondary | ICD-10-CM

## 2023-07-21 ENCOUNTER — Telehealth (HOSPITAL_BASED_OUTPATIENT_CLINIC_OR_DEPARTMENT_OTHER): Payer: Self-pay

## 2023-07-21 DIAGNOSIS — B078 Other viral warts: Secondary | ICD-10-CM | POA: Diagnosis not present

## 2023-07-21 DIAGNOSIS — L720 Epidermal cyst: Secondary | ICD-10-CM | POA: Diagnosis not present

## 2023-07-21 DIAGNOSIS — Z85828 Personal history of other malignant neoplasm of skin: Secondary | ICD-10-CM | POA: Diagnosis not present

## 2023-07-24 ENCOUNTER — Other Ambulatory Visit: Payer: Self-pay | Admitting: Pulmonary Disease

## 2023-08-23 ENCOUNTER — Other Ambulatory Visit: Payer: Self-pay | Admitting: Pulmonary Disease

## 2023-08-27 ENCOUNTER — Ambulatory Visit: Admitting: Pulmonary Disease

## 2023-08-27 VITALS — BP 120/84 | HR 72 | Ht 62.0 in | Wt 130.0 lb

## 2023-08-27 DIAGNOSIS — J4540 Moderate persistent asthma, uncomplicated: Secondary | ICD-10-CM

## 2023-08-27 DIAGNOSIS — R053 Chronic cough: Secondary | ICD-10-CM | POA: Diagnosis not present

## 2023-08-27 NOTE — Patient Instructions (Signed)
 VISIT SUMMARY:  During your visit, we discussed your persistent dry throat and cough, asthma management, sinus congestion, and acid reflux. We reviewed your current medications and made some recommendations to help manage your symptoms more effectively.  YOUR PLAN:  -ASTHMA: Asthma is a condition where your airways narrow and swell, making it difficult to breathe. Your asthma is well-controlled with your current medication, Breo 200. Continue using it as prescribed.  -GASTROESOPHAGEAL REFLUX DISEASE (GERD): GERD is a condition where stomach acid frequently flows back into the tube connecting your mouth and stomach. Your symptoms are well-managed with omeprazole , which you take at night, especially after late meals. Continue taking omeprazole  as directed.  -ALLERGIC RHINITIS WITH POSTNASAL DRIP: Allergic rhinitis is an allergic reaction that causes sneezing, congestion, and a runny nose. Postnasal drip is when excess mucus runs down the back of your throat. You are using chlorpheniramine at night to manage symptoms and avoid daytime drowsiness. You often forget to use Nasonex, which is a nasal spray that can help with your symptoms. Please use Nasonex once daily in the morning, and increase to twice daily if needed.  -DRY THROAT AND INTERMITTENT DRY COUGH: You have occasional symptoms of a dry throat and dry cough. These symptoms are sporadic and occur particularly when you are seated. No specific treatment changes were made for this issue at this time.  INSTRUCTIONS:  Please remember to use Nasonex once daily in the morning, and increase to twice daily if needed. Continue taking your other medications as directed. Follow up with us  if your symptoms persist or worsen.

## 2023-08-27 NOTE — Progress Notes (Signed)
 Diana Cisneros    [id number]    1945/04/25  Primary Care Physician:Polite, Tanda, MD  Referring Physician: Rexanne Tanda, MD [address] [place],  [place] [address]  Chief complaint: Follow-up for cough, asthma  HPI: 78 y.o.  with history of allergic rhinitis, GERD, upper airway cough, mild asthma hyperlipidemia Symptoms stable on Breo inhaler, steroid nasal spray, OTC antihistamines and PPI Developed COVID-19 in December 2023 just treated with Paxlovid .    Interim history: Discussed the use of AI scribe software for clinical note transcription with the patient, who gave verbal consent to proceed. History of Present Illness Diana Cisneros is a 78 year old female with asthma and acid reflux who presents with a persistent dry throat and cough.  Pharyngeal dryness and cough - Persistent dry throat with occasional dry cough - Previous sensation of a foreign body in the throat, treated with antibiotics resulting in resolution of pain but persistent dryness - Dry cough occurs sporadically, particularly when seated  Asthma symptoms and management - History of asthma - Currently using Breo 200, which is effective in controlling symptoms  Upper airway symptoms - Sinus congestion and postnasal drip - Prescribed Nasonex but frequently forgets to use it - Uses chlorpheniramine at night to manage symptoms without daytime drowsiness - Occasionally takes an additional half dose of chlorpheniramine during symptomatic days  Gastroesophageal reflux symptoms and management - Acid reflux managed with omeprazole , taken at night, especially after late meals - Omeprazole  is effective in controlling reflux symptoms   Relevant Pulmonary History: Pets: Has a dog and a cat. Occupation: Used to work in [employer].  Now works in [employer] after retirement Exposures: Reports mold exposure 10 years ago.  No recent exposure.  No dampness, Jacuzzi, hot tub. Smoking  history: Minimal smoking as a teenager. Travel history: Originally from [place] .  Lived in [place] .  No significant recent travel. Relevant family history: No significant family history of lung disease.  Outpatient Encounter Medications as of 08/27/2023  Medication Sig   alendronate  (FOSAMAX ) 70 MG tablet Take 1 tablet (70 mg total) by mouth every 7 (seven) days. Take first thing in am with 6 oz. Water.  Be upright after taking.  Eat nothing for one hour.   Ascorbic Acid (VITAMIN C) 100 MG tablet Take 100 mg by mouth daily.     augmented betamethasone  dipropionate (DIPROLENE -AF) 0.05 % cream APPLY TO AFFECTED AREA AS NEEDED   Bempedoic Acid  (NEXLETOL ) 180 MG TABS Take 1 tablet by mouth every other day.   benzonatate (TESSALON) 100 MG capsule Take 100 mg by mouth 3 (three) times daily as needed.   BREO ELLIPTA  200-25 MCG/ACT AEPB TAKE 1 PUFF BY MOUTH EVERY DAY   Calcium  Carbonate-Vitamin D  (CALCIUM  + D PO) Take 1 tablet by mouth daily.    chlorpheniramine (CHLOR-TRIMETON) 4 MG tablet Take 4 mg by mouth 2 (two) times daily as needed for allergies.   darifenacin  (ENABLEX ) 7.5 MG 24 hr tablet Take 1 tablet (7.5 mg total) by mouth daily. ONE PO QD   denosumab  (PROLIA ) 60 MG/ML SOSY injection Inject 60 mg into the skin every 6 (six) months.   diazepam (VALIUM) 5 MG tablet Take 5 mg by mouth daily as needed.   diclofenac sodium (VOLTAREN) 1 % GEL APPLY 2 4 GRAMS 3 TO 4 TIMES DAILY   fluticasone  (CUTIVATE) 0.005 % ointment SMARTSIG:Sparingly Topical Twice Daily   gabapentin (NEURONTIN) 100  MG capsule Take 100-300 mg by mouth at bedtime as needed (pain or insomnia).   Glucosamine-Chondroit-Vit C-Mn (GLUCOSAMINE CHONDR 1500 COMPLX PO) See admin instructions.   loratadine  (CLARITIN ) 10 MG tablet Take 1 tablet (10 mg total) by mouth daily.   losartan (COZAAR) 25 MG tablet Take 25 mg by mouth daily.   Magnesium 300 MG CAPS See admin instructions.   meloxicam (MOBIC) 15 MG tablet Take 7.5 mg  by mouth daily.   methocarbamol (ROBAXIN) 750 MG tablet Take 750 mg by mouth every 8 (eight) hours as needed.   Misc Natural Products (OSTEO BI-FLEX JOINT SHIELD PO) See admin instructions.   omeprazole  (PRILOSEC) 20 MG capsule TAKE 1 CAPSULE BY MOUTH AT BEDTIME.   ondansetron  (ZOFRAN ) 4 MG tablet Take 1 tablet (4 mg total) by mouth every 8 (eight) hours as needed for nausea or vomiting.   pantoprazole  (PROTONIX ) 40 MG tablet Take 1 tablet (40 mg total) by mouth daily.   Potassium 75 MG TABS See admin instructions.   rosuvastatin  (CRESTOR ) 20 MG tablet TAKE 1 TABLET BY MOUTH EVERY DAY   Specialty Vitamins Products (MAGNESIUM, AMINO ACID CHELATE,) 133 MG tablet Take 1 tablet by mouth daily.     SUPER B COMPLEX/C PO See admin instructions.   traMADol (ULTRAM) 50 MG tablet TAKE 1 TO 2 TABLETS BY MOUTH 2 TO 3 TIMES DAILY AS NEEDED   vitamin B-12 (CYANOCOBALAMIN) 100 MCG tablet 1 tablet   VITAMIN D , CHOLECALCIFEROL, PO Take 1 tablet by mouth daily.    No facility-administered encounter medications on file as of 08/27/2023.   Physical Exam: Blood pressure 136/60, pulse 70, temperature 98.2 F (36.8 C), temperature source Oral, height 5' 2 (1.575 m), weight 123 lb 6.4 oz (56 kg), SpO2 97 %. Gen:      No acute distress HEENT:  EOMI, sclera anicteric Neck:     No masses; no thyromegaly Lungs:    Clear to auscultation bilaterally; normal respiratory effort CV:         Regular rate and rhythm; no murmurs Abd:      + bowel sounds; soft, non-tender; no palpable masses, no distension Ext:    No edema; adequate peripheral perfusion Skin:      Warm and dry; no rash Neuro: alert and oriented x 3 Psych: normal mood and affect   Data Reviewed: Imaging: CT chest 04/09/2007-visualized lung bases are normal. Chest x-ray 06/01/2015-clear lungs, no acute cardiopulmonary abnormality. Chest x-ray 10/29/2017- no active lung disease, chronic scarring the right middle lobe and lingula. Cardiac CT  05/31/2019-visualized lungs show mild bronchiectasis with tree-in-bud appearance. High-resolution CT 08/26/2019-mild areas of centrilobular nodularity, tree-in-bud with bronchial plugging. High-resolution CT 09/05/2021-stable areas of centrilobular nodularity, tree-in-bud Chest x-ray 03/18/2022-no acute findings I have reviewed the images personally.  PFTs: 11/24/2017 FVC 2.35 [89%), FEV1 1.53 [77%), F/F 65 post FEV1 1.90 [96%, +23%), TLC 115%, DLCO 18% Moderate obstruction with significant bronchodilator response and air trapping.  FENO 10/29/2017-18 FENO 11/24/2017-23  ACT score  04/09/2019-22 09/17/2021-24  Labs: CBC 10/29/2017-WBC 5.2, eos 2.8%, absolute eosinophil count 146 CBC 04/01/2019-WBC 5.4, eos 2.1%, absolute eosinophil count 113  Respiratory allergy  profile allergy  profile 10/29/2017-IgE 31, RAST panel-negative Respiratory allergy  profile 04/09/2019-IgE 28, RAST panel negative  Sputum cultures 08/31/2019-Enterobacter, AFB is negative  Assessment & Plan Asthma Asthma is well-controlled with Breo 200.  Gastroesophageal reflux disease (GERD) GERD symptoms are well-managed with omeprazole  taken at night, especially when eating late.  Allergic rhinitis with postnasal drip Upper airway cough Allergic rhinitis  with postnasal drip is present. Chlorpheniramine is used at night to manage symptoms, avoiding daytime drowsiness. Nasonex is available but often forgotten. Nasonex is not associated with addiction, unlike decongestant sprays. - Advise to use Nasonex once daily in the morning. Increase to twice daily if needed.  Mild bronchiectasis Has mild bronchiectasis on CT scan with tree-in-bud.  Treated for Enterobacter in sputum in 2021.  Sputum for AFB is negative Currently asymptomatic.  CT reviewed from 2023   Plan/Recommendations: - Chlorphentermine PRN, Nasonex - Omeprazole  - Continue Breo  Robt Okuda MD [facility] 08/27/2023, 9:25 AM  CC:  Rexanne Ingle, MD

## 2023-09-10 DIAGNOSIS — M1611 Unilateral primary osteoarthritis, right hip: Secondary | ICD-10-CM | POA: Diagnosis not present

## 2023-09-15 ENCOUNTER — Inpatient Hospital Stay
Admission: RE | Admit: 2023-09-15 | Discharge: 2023-09-15 | Discharge status: Home / Self Care | Payer: Medicare Other | Source: Ambulatory Visit | Attending: Internal Medicine | Admitting: Internal Medicine

## 2023-09-15 DIAGNOSIS — Z1231 Encounter for screening mammogram for malignant neoplasm of breast: Secondary | ICD-10-CM

## 2023-09-17 ENCOUNTER — Encounter: Payer: Self-pay | Admitting: Podiatry

## 2023-09-17 ENCOUNTER — Ambulatory Visit: Payer: Medicare Other | Admitting: Podiatry

## 2023-09-17 DIAGNOSIS — M79672 Pain in left foot: Secondary | ICD-10-CM | POA: Diagnosis not present

## 2023-09-17 DIAGNOSIS — M79671 Pain in right foot: Secondary | ICD-10-CM | POA: Diagnosis not present

## 2023-09-17 DIAGNOSIS — M79675 Pain in left toe(s): Secondary | ICD-10-CM | POA: Diagnosis not present

## 2023-09-17 DIAGNOSIS — M79674 Pain in right toe(s): Secondary | ICD-10-CM | POA: Diagnosis not present

## 2023-09-17 DIAGNOSIS — B351 Tinea unguium: Secondary | ICD-10-CM | POA: Diagnosis not present

## 2023-09-17 DIAGNOSIS — Q828 Other specified congenital malformations of skin: Secondary | ICD-10-CM

## 2023-09-20 ENCOUNTER — Other Ambulatory Visit: Payer: Self-pay | Admitting: Pulmonary Disease

## 2023-09-21 NOTE — Progress Notes (Signed)
  Subjective:  Patient ID: Diana Cisneros, female    DOB: Jun 19, 1945,  MRN: [id number]  Diana Cisneros presents to clinic today for painful porokeratotic lesion(s) left foot and right foot and painful mycotic toenails that limit ambulation. Painful toenails interfere with ambulation. Aggravating factors include wearing enclosed shoe gear. Pain is relieved with periodic professional debridement. Painful porokeratotic lesions are aggravated when weightbearing with and without shoegear. Pain is relieved with periodic professional debridement.  Chief Complaint  Patient presents with   RFC    RFC Non daibetic toenail trim. LOV with PCP 06/2023.   New problem(s): None.   PCP is Rexanne Ingle, MD.  Allergies  Allergen Reactions   Morphine And Codeine Hives   Other Swelling    Other reaction(s): rash/lips swelling   Morphine Sulfate Other (See Comments)    Other reaction(s): itching/redface   Naproxen Sodium Rash   Zetia  [Ezetimibe ]     Muscle pain and leg cramps on 10mg  daily, 5mg  daily, and 5mg  every other day    Review of Systems: Negative except as noted in the HPI.  Objective: No changes noted in today's physical examination. There were no vitals filed for this visit. Diana Cisneros is a pleasant 78 y.o. female WD, WN in NAD. AAO x 3.  Vascular Examination: Capillary refill time immediate b/l. Vascular status intact b/l with palpable pedal pulses. Pedal hair present b/l. No pain with calf compression b/l. Skin temperature gradient WNL b/l. No cyanosis or clubbing b/l. No ischemia or gangrene noted b/l.   Neurological Examination: Sensation grossly intact b/l with 10 gram monofilament.  Dermatological Examination: Pedal skin with normal turgor, texture and tone b/l.  No open wounds. No interdigital macerations.   Toenails 1-5 b/l thick, discolored, elongated with subungual debris and pain on dorsal palpation.   Porokeratotic lesion(s) submet head 2 b/lt. No erythema, no edema, no  drainage, no fluctuance.  Musculoskeletal Examination: Muscle strength 5/5 to all lower extremity muscle groups bilaterally. HAV with bunion deformity noted b/l LE. Hammertoe(s) bilateral 2nd toes and bilateral 5th toes.  Radiographs: None Assessment/Plan: 1. Pain due to onychomycosis of toenails of both feet   2. Porokeratosis   3. Pain in both feet     Patient was evaluated and treated. All patient's and/or POA's questions/concerns addressed on today's visit. Toenails 1-5 debrided in length and girth without incident. Porokeratotic lesion(s) submet head 2 b/l pared with sharp debridement without incident. Continue soft, supportive shoe gear daily. Report any pedal injuries to medical professional. Call office if there are any questions/concerns.  Return in about 3 months (around 12/18/2023).  Delon LITTIE Merlin, DPM      [place] LOCATION: [address] [place] [address]                   Office [phone]   [facility] LOCATION: [address] [place], [place] [address] Office [phone]

## 2023-09-22 DIAGNOSIS — L720 Epidermal cyst: Secondary | ICD-10-CM | POA: Diagnosis not present

## 2023-09-26 DIAGNOSIS — M1711 Unilateral primary osteoarthritis, right knee: Secondary | ICD-10-CM | POA: Diagnosis not present

## 2023-09-26 DIAGNOSIS — M1611 Unilateral primary osteoarthritis, right hip: Secondary | ICD-10-CM | POA: Diagnosis not present

## 2023-10-06 DIAGNOSIS — B078 Other viral warts: Secondary | ICD-10-CM | POA: Diagnosis not present

## 2023-10-13 DIAGNOSIS — I700 Atherosclerosis of aorta: Secondary | ICD-10-CM | POA: Diagnosis not present

## 2023-10-13 DIAGNOSIS — E7800 Pure hypercholesterolemia, unspecified: Secondary | ICD-10-CM | POA: Diagnosis not present

## 2023-10-13 DIAGNOSIS — I10 Essential (primary) hypertension: Secondary | ICD-10-CM | POA: Diagnosis not present

## 2023-10-13 DIAGNOSIS — Z5181 Encounter for therapeutic drug level monitoring: Secondary | ICD-10-CM | POA: Diagnosis not present

## 2023-10-13 DIAGNOSIS — M25559 Pain in unspecified hip: Secondary | ICD-10-CM | POA: Diagnosis not present

## 2023-10-17 ENCOUNTER — Telehealth: Payer: Self-pay | Admitting: Cardiovascular Disease

## 2023-10-17 ENCOUNTER — Other Ambulatory Visit: Payer: Self-pay | Admitting: Cardiovascular Disease

## 2023-10-17 NOTE — Telephone Encounter (Signed)
 Pt c/o medication issue:  1. Name of Medication: Bempedoic Acid  (NEXLETOL ) 180 MG TABS   2. How are you currently taking this medication (dosage and times per day)? As written   3. Are you having a reaction (difficulty breathing--STAT)? No   4. What is your medication issue? Pt called in requesting to speak with pharmacist about this med. She states she was in a rush and had to hang up before she stated what it was in regards to. Please advise.

## 2023-10-24 NOTE — Telephone Encounter (Signed)
 Wanted to let us  know that the bempedoic acid  is getting LDL to goal.  Will send copy of most recent labs via MyChart

## 2023-11-10 DIAGNOSIS — Z85828 Personal history of other malignant neoplasm of skin: Secondary | ICD-10-CM | POA: Diagnosis not present

## 2023-11-10 DIAGNOSIS — B078 Other viral warts: Secondary | ICD-10-CM | POA: Diagnosis not present

## 2023-11-10 DIAGNOSIS — L820 Inflamed seborrheic keratosis: Secondary | ICD-10-CM | POA: Diagnosis not present

## 2023-12-15 ENCOUNTER — Encounter: Payer: Self-pay | Admitting: Podiatry

## 2023-12-15 ENCOUNTER — Ambulatory Visit: Admitting: Podiatry

## 2023-12-15 DIAGNOSIS — B351 Tinea unguium: Secondary | ICD-10-CM

## 2023-12-15 DIAGNOSIS — Q828 Other specified congenital malformations of skin: Secondary | ICD-10-CM

## 2023-12-15 DIAGNOSIS — M79674 Pain in right toe(s): Secondary | ICD-10-CM

## 2023-12-15 DIAGNOSIS — M79675 Pain in left toe(s): Secondary | ICD-10-CM

## 2023-12-15 DIAGNOSIS — M79671 Pain in right foot: Secondary | ICD-10-CM

## 2023-12-15 DIAGNOSIS — M79672 Pain in left foot: Secondary | ICD-10-CM

## 2023-12-16 NOTE — Patient Instructions (Signed)
 SURGICAL WAITING ROOM VISITATION Patients having surgery or a procedure may have no more than 2 support people in the waiting area - these visitors may rotate in the visitor waiting room.   Due to an increase in RSV and influenza rates and associated hospitalizations, children ages 75 and under may not visit patients in [facility] hospitals. If the patient needs to stay at the hospital during part of their recovery, the visitor guidelines for inpatient rooms apply.  PRE-OP VISITATION  Pre-op nurse will coordinate an appropriate time for 1 support person to accompany the patient in pre-op.  This support person may not rotate.  This visitor will be contacted when the time is appropriate for the visitor to come back in the pre-op area.  Please refer to the [facility] website for the visitor guidelines for Inpatients (after your surgery is over and you are in a regular room).  You are not required to quarantine at this time prior to your surgery. However, you must do this: Hand Hygiene often Do NOT share personal items Notify your provider if you are in close contact with someone who has COVID or you develop fever 100.4 or greater, new onset of sneezing, cough, sore throat, shortness of breath or body aches.  If you test positive for Covid or have been in contact with anyone that has tested positive in the last 10 days please notify you surgeon.    Your procedure is scheduled on: 12/30/23   Report to [facility] Main Entrance: [place] entrance where the [employer] is available.   Report to admitting at: 8:20 AM  Call this number if you have any questions or problems the morning of surgery [phone]  FOLLOW ANY ADDITIONAL PRE OP INSTRUCTIONS YOU RECEIVED FROM YOUR SURGEON'S OFFICE!!!  Do not eat food after Midnight the night prior to your surgery/procedure.  After Midnight you may have the following liquids until: 7:50 AM DAY OF SURGERY  Clear Liquid Diet Water Black  Coffee (sugar ok, NO MILK/CREAM OR CREAMERS)  Tea (sugar ok, NO MILK/CREAM OR CREAMERS) regular and decaf                             Plain Jell-O  with no fruit (NO RED)                                           Fruit ices (not with fruit pulp, NO RED)                                     Popsicles (NO RED)                                                                  Juice: NO CITRUS JUICES: only apple, WHITE grape, WHITE cranberry Sports drinks like Gatorade or Powerade (NO RED)   The day of surgery:  Drink ONE (1) Pre-Surgery Clear Ensure at : 7:50 AM the morning of surgery. Drink in one sitting. Do not sip.  This drink was given  to you during your hospital pre-op appointment visit. Nothing else to drink after completing the Pre-Surgery Clear Ensure or G2 : No candy, chewing gum or throat lozenges.    Oral Hygiene is also important to reduce your risk of infection.        Remember - BRUSH YOUR TEETH THE MORNING OF SURGERY WITH YOUR REGULAR TOOTHPASTE  Do NOT smoke after Midnight the night before surgery.  STOP TAKING all Vitamins, Herbs and supplements 1 week before your surgery.   Take ONLY these medicines the morning of surgery with A SIP OF WATER: diazepam,loratadine ,pantoprazole ,darifenacin .Use inhalers as usual and bring them.  If You have been diagnosed with Sleep Apnea - Bring CPAP mask and tubing day of surgery. We will provide you with a CPAP machine on the day of your surgery.                   You may not have any metal on your body including hair pins, jewelry, and body piercing  Do not wear make-up, lotions, powders, perfumes / cologne, or deodorant  Do not wear nail polish including gel and S&S, artificial / acrylic nails, or any other type of covering on natural nails including finger and toenails. If you have artificial nails, gel coating, etc., that needs to be removed by a nail salon, Please have this removed prior to surgery. Not doing so may mean that your  surgery could be cancelled or delayed if the Surgeon or anesthesia staff feels like they are unable to monitor you safely.   Do not shave 48 hours prior to surgery to avoid nicks in your skin which may contribute to postoperative infections.   Contacts, Hearing Aids, dentures or bridgework may not be worn into surgery. DENTURES WILL BE REMOVED PRIOR TO SURGERY PLEASE DO NOT APPLY Poly grip OR ADHESIVES!!!  You may bring a small overnight bag with you on the day of surgery, only pack items that are not valuable. [facility] IS NOT RESPONSIBLE   FOR VALUABLES THAT ARE LOST OR STOLEN.   Patients discharged on the day of surgery will not be allowed to drive home.  Someone NEEDS to stay with you for the first 24 hours after anesthesia.  Do not bring your home medications to the hospital. The Pharmacy will dispense medications listed on your medication list to you during your admission in the Hospital.  Special Instructions: Bring a copy of your healthcare power of attorney and living will documents the day of surgery, if you wish to have them scanned into your  Medical Records- EPIC  Please read over the following fact sheets you were given: IF YOU HAVE QUESTIONS ABOUT YOUR PRE-OP INSTRUCTIONS, PLEASE CALL [phone]  PATIENT SIGNATURE_________________________________  NURSE SIGNATURE__________________________________  ________________________________________________________________________  Pre-operative 4 CHG Bath Instructions  DYNA-Hex 4 Chlorhexidine Gluconate 4% Solution Antiseptic 4 fl. oz   You can play a key role in reducing the risk of infection after surgery. Your skin needs to be as free of germs as possible. You can reduce the number of germs on your skin by washing with CHG (chlorhexidine gluconate) soap before surgery. CHG is an antiseptic soap that kills germs and continues to kill germs even after washing.   DO NOT use if you have an allergy  to chlorhexidine/CHG  or antibacterial soaps. If your skin becomes reddened or irritated, stop using the CHG and notify one of our RNs at   Please shower with the CHG soap starting 4 days before surgery using  the following schedule:     Please keep in mind the following:  DO NOT shave, including legs and underarms, starting the day of your first shower.   You may shave your face at any point before/day of surgery.  Place clean sheets on your bed the day you start using CHG soap. Use a clean washcloth (not used since being washed) for each shower. DO NOT sleep with pets once you start using the CHG.  CHG Shower Instructions:  If you choose to wash your hair and private area, wash first with your normal shampoo/soap.  After you use shampoo/soap, rinse your hair and body thoroughly to remove shampoo/soap residue.  Turn the water OFF and apply about 3 tablespoons (45 ml) of CHG soap to a CLEAN washcloth.  Apply CHG soap ONLY FROM YOUR NECK DOWN TO YOUR TOES (washing for 3-5 minutes)  DO NOT use CHG soap on face, private areas, open wounds, or sores.  Pay special attention to the area where your surgery is being performed.  If you are having back surgery, having someone wash your back for you may be helpful. Wait 2 minutes after CHG soap is applied, then you may rinse off the CHG soap.  Pat dry with a clean towel  Put on clean clothes/pajamas   If you choose to wear lotion, please use ONLY the CHG-compatible lotions on the back of this paper.     Additional instructions for the day of surgery: DO NOT APPLY any lotions, deodorants, cologne, or perfumes.   Put on clean/comfortable clothes.  Brush your teeth.  Ask your nurse before applying any prescription medications to the skin.   CHG Compatible Lotions   Aveeno Moisturizing lotion  Cetaphil Moisturizing Cream  Cetaphil Moisturizing Lotion  Clairol Herbal Essence Moisturizing Lotion, Dry Skin  Clairol Herbal Essence Moisturizing Lotion, Extra Dry Skin   Clairol Herbal Essence Moisturizing Lotion, Normal Skin  Curel Age Defying Therapeutic Moisturizing Lotion with Alpha Hydroxy  Curel Extreme Care Body Lotion  Curel Soothing Hands Moisturizing Hand Lotion  Curel Therapeutic Moisturizing Cream, Fragrance-Free  Curel Therapeutic Moisturizing Lotion, Fragrance-Free  Curel Therapeutic Moisturizing Lotion, Original Formula  Eucerin Daily Replenishing Lotion  Eucerin Dry Skin Therapy Plus Alpha Hydroxy Crme  Eucerin Dry Skin Therapy Plus Alpha Hydroxy Lotion  Eucerin Original Crme  Eucerin Original Lotion  Eucerin Plus Crme Eucerin Plus Lotion  Eucerin TriLipid Replenishing Lotion  Keri Anti-Bacterial Hand Lotion  Keri Deep Conditioning Original Lotion Dry Skin Formula Softly Scented  Keri Deep Conditioning Original Lotion, Fragrance Free Sensitive Skin Formula  Keri Lotion Fast Absorbing Fragrance Free Sensitive Skin Formula  Keri Lotion Fast Absorbing Softly Scented Dry Skin Formula  Keri Original Lotion  Keri Skin Renewal Lotion Keri Silky Smooth Lotion  Keri Silky Smooth Sensitive Skin Lotion  Nivea Body Creamy Conditioning Oil  Nivea Body Extra Enriched Lotion  Nivea Body Original Lotion  Nivea Body Sheer Moisturizing Lotion Nivea Crme  Nivea Skin Firming Lotion  NutraDerm 30 Skin Lotion  NutraDerm Skin Lotion  NutraDerm Therapeutic Skin Cream  NutraDerm Therapeutic Skin Lotion  ProShield Protective Hand Cream  Provon moisturizing lotion  Incentive Spirometer  An incentive spirometer is a tool that can help keep your lungs clear and active. This tool measures how well you are filling your lungs with each breath. Taking long deep breaths may help reverse or decrease the chance of developing breathing (pulmonary) problems (especially infection) following: A long period of time when you are unable to move or  be active. BEFORE THE PROCEDURE  If the spirometer includes an indicator to show your best effort, your nurse or  respiratory therapist will set it to a desired goal. If possible, sit up straight or lean slightly forward. Try not to slouch. Hold the incentive spirometer in an upright position. INSTRUCTIONS FOR USE  Sit on the edge of your bed if possible, or sit up as far as you can in bed or on a chair. Hold the incentive spirometer in an upright position. Breathe out normally. Place the mouthpiece in your mouth and seal your lips tightly around it. Breathe in slowly and as deeply as possible, raising the piston or the ball toward the top of the column. Hold your breath for 3-5 seconds or for as long as possible. Allow the piston or ball to fall to the bottom of the column. Remove the mouthpiece from your mouth and breathe out normally. Rest for a few seconds and repeat Steps 1 through 7 at least 10 times every 1-2 hours when you are awake. Take your time and take a few normal breaths between deep breaths. The spirometer may include an indicator to show your best effort. Use the indicator as a goal to work toward during each repetition. After each set of 10 deep breaths, practice coughing to be sure your lungs are clear. If you have an incision (the cut made at the time of surgery), support your incision when coughing by placing a pillow or rolled up towels firmly against it. Once you are able to get out of bed, walk around indoors and cough well. You may stop using the incentive spirometer when instructed by your caregiver.  RISKS AND COMPLICATIONS Take your time so you do not get dizzy or light-headed. If you are in pain, you may need to take or ask for pain medication before doing incentive spirometry. It is harder to take a deep breath if you are having pain. AFTER USE Rest and breathe slowly and easily. It can be helpful to keep track of a log of your progress. Your caregiver can provide you with a simple table to help with this. If you are using the spirometer at home, follow these  instructions: SEEK MEDICAL CARE IF:  You are having difficultly using the spirometer. You have trouble using the spirometer as often as instructed. Your pain medication is not giving enough relief while using the spirometer. You develop fever of 100.5 F (38.1 C) or higher. SEEK IMMEDIATE MEDICAL CARE IF:  You cough up bloody sputum that had not been present before. You develop fever of 102 F (38.9 C) or greater. You develop worsening pain at or near the incision site. MAKE SURE YOU:  Understand these instructions. Will watch your condition. Will get help right away if you are not doing well or get worse. Document Released: 05/27/2006 Document Revised: 04/08/2011 Document Reviewed: 07/28/2006 [facility] Patient Information 2014 [place], [place].   ________________________________________________________________________

## 2023-12-17 ENCOUNTER — Other Ambulatory Visit: Payer: Self-pay

## 2023-12-17 ENCOUNTER — Encounter (HOSPITAL_COMMUNITY): Payer: Self-pay

## 2023-12-17 ENCOUNTER — Encounter (HOSPITAL_COMMUNITY)
Admission: RE | Admit: 2023-12-17 | Discharge: 2023-12-17 | Disposition: A | Discharge status: Home / Self Care | Source: Ambulatory Visit | Attending: Orthopedic Surgery | Admitting: Orthopedic Surgery

## 2023-12-17 VITALS — BP 144/80 | HR 77 | Temp 98.4°F | Ht 62.0 in | Wt 129.0 lb

## 2023-12-17 DIAGNOSIS — M1611 Unilateral primary osteoarthritis, right hip: Secondary | ICD-10-CM | POA: Diagnosis not present

## 2023-12-17 DIAGNOSIS — I10 Essential (primary) hypertension: Secondary | ICD-10-CM | POA: Diagnosis not present

## 2023-12-17 DIAGNOSIS — Z01810 Encounter for preprocedural cardiovascular examination: Secondary | ICD-10-CM | POA: Diagnosis present

## 2023-12-17 DIAGNOSIS — Z01812 Encounter for preprocedural laboratory examination: Secondary | ICD-10-CM | POA: Diagnosis present

## 2023-12-17 DIAGNOSIS — Z01818 Encounter for other preprocedural examination: Secondary | ICD-10-CM | POA: Diagnosis not present

## 2023-12-17 HISTORY — DX: Unspecified osteoarthritis, unspecified site: M19.90

## 2023-12-17 HISTORY — DX: Essential (primary) hypertension: I10

## 2023-12-17 HISTORY — DX: Malignant (primary) neoplasm, unspecified: C80.1

## 2023-12-17 HISTORY — DX: Pneumonia, unspecified organism: J18.9

## 2023-12-17 LAB — BASIC METABOLIC PANEL WITH GFR
Anion gap: 8 (ref 5–15)
BUN: 25 mg/dL — ABNORMAL HIGH (ref 8–23)
CO2: 28 mmol/L (ref 22–32)
Calcium: 10.2 mg/dL (ref 8.9–10.3)
Chloride: 104 mmol/L (ref 98–111)
Creatinine, Ser: 1.16 mg/dL — ABNORMAL HIGH (ref 0.44–1.00)
GFR, Estimated: 48 mL/min — ABNORMAL LOW (ref >60)
Glucose, Bld: 69 mg/dL — ABNORMAL LOW (ref 70–99)
Potassium: 4.6 mmol/L (ref 3.5–5.1)
Sodium: 139 mmol/L (ref 135–145)

## 2023-12-17 LAB — CBC
HCT: 41.9 % (ref 36.0–46.0)
Hemoglobin: 13.3 g/dL (ref 12.0–15.0)
MCH: 30 pg (ref 26.0–34.0)
MCHC: 31.7 g/dL (ref 30.0–36.0)
MCV: 94.6 fL (ref 80.0–100.0)
Platelets: 206 K/uL (ref 150–400)
RBC: 4.43 MIL/uL (ref 3.87–5.11)
RDW: 14.5 % (ref 11.5–15.5)
WBC: 6 K/uL (ref 4.0–10.5)
nRBC: 0 % (ref 0.0–0.2)

## 2023-12-17 LAB — SURGICAL PCR SCREEN
MRSA, PCR: NEGATIVE
Staphylococcus aureus: POSITIVE — AB

## 2023-12-17 LAB — TYPE AND SCREEN
ABO/RH(D): A POS
Antibody Screen: NEGATIVE

## 2023-12-17 NOTE — Progress Notes (Signed)
 For Anesthesia: PCP - Rexanne Ingle, MD  Cardiologist - Delford Maude BROCKS, MD LOV: 04/24/22 Pulmonologist: Mannam, Praveen, MD LOV: 08/27/23 Bowel Prep reminder:  Chest x-ray - CT coronary: 05/31/19 EKG - 12/17/23 Stress Test -  ECHO -  Cardiac Cath -  Pacemaker/ICD device last checked: Pacemaker orders received: Device Rep notified:  Spinal Cord Stimulator:N/A  Sleep Study - N/A CPAP -   Fasting Blood Sugar - N/A Checks Blood Sugar _____ times a day Date and result of last Hgb A1c-  Last dose of GLP1 agonist- N/A GLP1 instructions: Hold 7 days prior to schedule (Hold 24 hours-daily)   Last dose of SGLT-2 inhibitors- N/A SGLT-2 instructions: Hold 72 hours prior to surgery  Blood Thinner Instructions:N/A Last Dose: Time last taken:  Aspirin Instructions:N/A Last Dose: Time last taken:  Activity level: Can go up a flight of stairs and activities of daily living without stopping and without chest pain and/or shortness of breath   Able to exercise without chest pain and/or shortness of breath  Anesthesia review: Hx: HTN,Dyspnea.  Patient denies shortness of breath, fever, cough and chest pain at PAT appointment   Patient verbalized understanding of instructions that were reviewed over the telephone.

## 2023-12-17 NOTE — Progress Notes (Signed)
 PCR: + STAPH.

## 2023-12-21 NOTE — Progress Notes (Signed)
  Subjective:  Patient ID: Diana Cisneros, female    DOB: 10-Apr-1945,  MRN: [id number]  Diana Cisneros presents to clinic today for painful porokeratotic lesion(s) of both feet and painful mycotic toenails that limit ambulation. Painful toenails interfere with ambulation. Aggravating factors include wearing enclosed shoe gear. Pain is relieved with periodic professional debridement. Painful porokeratotic lesions are aggravated when weightbearing with and without shoegear. Pain is relieved with periodic professional debridement.  Chief Complaint  Patient presents with   Toe Pain    Dr. Rexanne is her PCP. Last visit was in Sept. Denies being diabetic   New problem(s): None.   PCP is Rexanne Ingle, MD.  Allergies  Allergen Reactions   Morphine And Codeine Hives   Other Swelling    Other reaction(s): rash/lips swelling   Morphine Sulfate Other (See Comments)    Other reaction(s): itching/redface   Naproxen Sodium Rash   Zetia  [Ezetimibe ]     Muscle pain and leg cramps on 10mg  daily, 5mg  daily, and 5mg  every other day    Review of Systems: Negative except as noted in the HPI.  Objective: No changes noted in today's physical examination. There were no vitals filed for this visit. Diana Cisneros is a pleasant 78 y.o. female WD, WN in NAD. AAO x 3.  Vascular Examination: Capillary refill time immediate b/l. Vascular status intact b/l with palpable pedal pulses. Pedal hair present b/l. No pain with calf compression b/l. Skin temperature gradient WNL b/l. No cyanosis or clubbing b/l. No ischemia or gangrene noted b/l.   Neurological Examination: Sensation grossly intact b/l with 10 gram monofilament.  Dermatological Examination: Pedal skin with normal turgor, texture and tone b/l.  No open wounds. No interdigital macerations.   Toenails 1-5 b/l thick, discolored, elongated with subungual debris and pain on dorsal palpation.   Porokeratotic lesion(s) submet head 2 b/lt. No erythema, no  edema, no drainage, no fluctuance.  Musculoskeletal Examination: Muscle strength 5/5 to all lower extremity muscle groups bilaterally. HAV with bunion deformity noted b/l LE. Hammertoe(s) bilateral 2nd toes and bilateral 5th toes.  Radiographs: None  Assessment/Plan: 1. Porokeratosis   2. Pain in both feet    -Consent given for treatment as described below: -Examined patient. -Patient to continue soft, supportive shoe gear daily. -Toenails 1-5 b/l were debrided in length and girth with sterile nail nippers and dremel without iatrogenic bleeding.  -Callus(es) medial IPJ of right great toe pared utilizing sterile scalpel blade. Pinpoint bleeding addressed with Lumicain hemostatic solution. TAO applied. She is to apply Nesoporin once daily for one week.  Total number debrided =1. -Porokeratotic lesion(s) submet head 2 b/l pared and enucleated with sterile currette without incident. Total number of lesions debrided=2. -Patient/POA to call should there be question/concern in the interim.   Return in about 9 weeks (around 02/16/2024).  Diana Cisneros, DPM      [place] LOCATION: [address] [place] [address]                   Office [phone]   [facility] LOCATION: [address] [place], [place] [address] Office [phone]

## 2023-12-23 ENCOUNTER — Telehealth: Payer: Self-pay

## 2023-12-23 ENCOUNTER — Other Ambulatory Visit: Payer: Self-pay | Admitting: *Deleted

## 2023-12-23 DIAGNOSIS — M810 Age-related osteoporosis without current pathological fracture: Secondary | ICD-10-CM

## 2023-12-23 MED ORDER — DENOSUMAB 60 MG/ML ~~LOC~~ SOSY: 60.0000 mg | PREFILLED_SYRINGE | SUBCUTANEOUS | Status: AC

## 2023-12-23 NOTE — Telephone Encounter (Signed)
 Prolia  VOB initiated via [email or web address]  Next Prolia  inj DUE: 01/15/24

## 2023-12-24 ENCOUNTER — Ambulatory Visit: Payer: Medicare Other | Admitting: Podiatry

## 2023-12-24 ENCOUNTER — Other Ambulatory Visit (HOSPITAL_COMMUNITY): Payer: Self-pay

## 2023-12-24 NOTE — Telephone Encounter (Signed)
 Proceed with MTDM services  Med obtained from pharmacy and shipped to clinic: Prolia  is no longer preferred for pharmacy benefit. Jubbonti is now preferred. PRICING IS FOR JUBBONTI  Pharmacy benefit: Copay $0 (Paid to pharmacy) Admin Fee: $20 (Pay at clinic)  Prior Auth: N/A PA# Expiration Date:   # of doses approved:   Patient NOT eligible for Copay Card. Copay Card can make patient's cost as little as $25. Link to apply: [email or web address]://www.[email or web address].[email or web address]/copay  ** This summary of benefits is an estimation of the patient's out-of-pocket cost. Exact cost may very based on individual plan coverage.

## 2023-12-24 NOTE — Telephone Encounter (Signed)
 SABRA

## 2023-12-29 NOTE — H&P (Signed)
 TOTAL HIP ADMISSION H&P  Patient is admitted for right total hip arthroplasty.  Therapy Plans: HEP Disposition: Home with husband Planned DVT Prophylaxis: aspirin 81mg  BID DME needed: walker PCP: Dr. Rexanne - clearance received TXA: IV Allergies: zetia  - muscle pain, morphine - nausea Anesthesia Concerns: none BMI: 23.8 Last HgbA1c: Not diabetic   Other: - staying overnight - Interested in Journoux vs tramadol (nausea with norco/oxy), flexeril 5-10, tylenol  - Biggest worry is nausea - No hx of VTE or cancer - Foley no matter what   Instructed patient on which medications to discontinue 5 days prior to surgery. Will follow-up in office with Dr. Ernie 2 weeks post-op.  Subjective:  Chief Complaint: right hip pain  HPI: Diana Cisneros, 78 y.o. female, has a history of pain and functional disability in the right hip(s) due to arthritis and patient has failed non-surgical conservative treatments for greater than 12 weeks to include NSAID's and/or analgesics and activity modification.  Onset of symptoms was gradual starting 1 years ago with gradually worsening course since that time.The patient noted no past surgery on the right hip(s).  Patient currently rates pain in the right hip at 8 out of 10 with activity. Patient has worsening of pain with activity and weight bearing, pain that interfers with activities of daily living, and pain with passive range of motion. Patient has evidence of joint space narrowing by imaging studies. This condition presents safety issues increasing the risk of falls.   There is no current active infection.  Patient Active Problem List   Diagnosis Date Noted   Acute sinusitis 07/19/2020   Allergic rhinitis 07/19/2020   Anxiety state 07/19/2020   Hardening of the aorta (main artery of the heart) 07/19/2020   Pure hypercholesterolemia 07/19/2020   Upper respiratory infection 07/19/2020   Hyperlipidemia 04/26/2019   Statin myopathy 04/26/2019   Nail  dystrophy 12/30/2018   Moderate persistent asthma without complication 02/25/2018   Degenerative disc disease    Past Medical History:  Diagnosis Date   Allergic rhinitis    Anxiety    Anxiousness    Arthritis    Cancer (HCC)    skin/ back   Degenerative disc disease    thoracic,lumbar   Hypercholesterolemia    Hyperlipidemia    Hypertension    Low back pain    Moderate persistent asthma without complication 02/25/2018   Muscle cramps    Nail dystrophy 12/30/2018   Osteopenia 10/2018   T score -2.2 distal third of forearm.  -1.6 left femoral neck FRAX 18% / 6.7%   Pneumonia    Seasonal allergies    Sinusitis, acute    STD (sexually transmitted disease)    chlamydia treated many yrs ago   URI (upper respiratory infection)     Past Surgical History:  Procedure Laterality Date   ABDOMINAL HYSTERECTOMY  1986   TAH  LSO   APPENDECTOMY  2009   BREAST EXCISIONAL BIOPSY Left 1995   BREAST SURGERY     Biopsy benign   CATARACT EXTRACTION, BILATERAL     COLONOSCOPY WITH PROPOFOL  N/A 12/27/2014   Procedure: COLONOSCOPY WITH PROPOFOL ;  Surgeon: Gladis MARLA Louder, MD;  Location: [facility];  Service: Endoscopy;  Laterality: N/A;   KNEE SURGERY Left    arthroscopic   OOPHORECTOMY     LSO 86,RSO 95   PELVIC LAPAROSCOPY  1995   RSO    Current Facility-Administered Medications  Medication Dose Route Frequency Provider Last Rate Last Admin   [START ON  01/16/2024] denosumab  (PROLIA ) injection 60 mg  60 mg Subcutaneous Q6 months Glennon Almarie POUR, MD       Current Outpatient Medications  Medication Sig Dispense Refill Last Dose/Taking   Bempedoic Acid  (NEXLETOL ) 180 MG TABS Take 1 tablet (180 mg total) by mouth daily. Please schedule MD appt for further refills 30 tablet 0 Taking   denosumab  (PROLIA ) 60 MG/ML SOSY injection Inject 60 mg into the skin every 6 (six) months.   Taking   diazepam (VALIUM) 5 MG tablet Take 5 mg by mouth daily as needed for anxiety or muscle spasms.   3 Taking As Needed   diclofenac (VOLTAREN) 75 MG EC tablet Take 75 mg by mouth 2 (two) times daily.   Taking   fluticasone  furoate-vilanterol (BREO ELLIPTA ) 200-25 MCG/ACT AEPB TAKE 1 PUFF BY MOUTH EVERY DAY 60 each 6 Taking   gabapentin (NEURONTIN) 100 MG capsule Take 100-300 mg by mouth at bedtime as needed (pain or insomnia).   Taking As Needed   loratadine  (CLARITIN ) 10 MG tablet Take 1 tablet (10 mg total) by mouth daily. 30 tablet 5 Taking   losartan (COZAAR) 25 MG tablet Take 25 mg by mouth daily.   Taking   Magnesium 300 MG CAPS Take 300 mg by mouth at bedtime.   Taking   methocarbamol (ROBAXIN) 750 MG tablet Take 750 mg by mouth every 8 (eight) hours as needed for muscle spasms.   Taking As Needed   Misc Natural Products (OSTEO BI-FLEX JOINT SHIELD PO) Take 1 tablet by mouth daily.   Taking   omeprazole  (PRILOSEC) 20 MG capsule TAKE 1 CAPSULE BY MOUTH AT BEDTIME. 90 capsule 0 Taking   rosuvastatin  (CRESTOR ) 20 MG tablet TAKE 1 TABLET BY MOUTH EVERY DAY 90 tablet 1 Taking   VITAMIN D , CHOLECALCIFEROL, PO Take 1 tablet by mouth daily.    Taking   alendronate  (FOSAMAX ) 70 MG tablet Take 1 tablet (70 mg total) by mouth every 7 (seven) days. Take first thing in am with 6 oz. Water.  Be upright after taking.  Eat nothing for one hour. 12 tablet 3 Not Taking   augmented betamethasone  dipropionate (DIPROLENE -AF) 0.05 % cream APPLY TO AFFECTED AREA AS NEEDED 50 g 0 Not Taking   darifenacin  (ENABLEX ) 7.5 MG 24 hr tablet Take 1 tablet (7.5 mg total) by mouth daily. ONE PO QD 30 tablet 1 Not Taking   ondansetron  (ZOFRAN ) 4 MG tablet Take 1 tablet (4 mg total) by mouth every 8 (eight) hours as needed for nausea or vomiting. 20 tablet 0 Not Taking   pantoprazole  (PROTONIX ) 40 MG tablet Take 1 tablet (40 mg total) by mouth daily. 7 tablet 0 Not Taking   Allergies  Allergen Reactions   Morphine And Codeine Hives   Other Swelling    Other reaction(s): rash/lips swelling   Morphine Sulfate Other (See  Comments)    Other reaction(s): itching/redface   Naproxen Sodium Rash   Zetia  [Ezetimibe ]     Muscle pain and leg cramps on 10mg  daily, 5mg  daily, and 5mg  every other day    Social History   Tobacco Use   Smoking status: Former    Current packs/day: 0.00    Types: Cigarettes    Quit date: 1965    Years since quitting: 60.9    Passive exposure: Past   Smokeless tobacco: Never   Tobacco comments:    one pack of cigarettes would last two weeks-10/29/17  Substance Use Topics   Alcohol use: Yes  Comment: occ    Family History  Problem Relation Age of Onset   Heart disease Mother    Stroke Father    Diabetes Maternal Grandmother    Heart disease Maternal Grandmother    Diabetes Maternal Grandfather    Cancer Maternal Grandfather        Colon   Breast cancer Paternal Grandmother        Age 38     Review of Systems  Constitutional:  Negative for chills and fever.  Respiratory:  Negative for cough and shortness of breath.   Cardiovascular:  Negative for chest pain.  Gastrointestinal:  Negative for nausea and vomiting.  Musculoskeletal:  Positive for arthralgias.     Objective:  Physical Exam Right hip exam: Passive range of motion of the right hip is limited with pain anteriorly with hip flexion internal rotation over 5 degrees with external rotation close to 30 degrees No significant external rotation contracture with active hip flexion Right knee exam: No palpable effusion, warmth erythema Full knee extension and flexion to 120 degrees Tenderness medially    Vital signs in last 24 hours:    Labs:   Estimated body mass index is 23.59 kg/m as calculated from the following:   Height as of 12/17/23: 5' 2 (1.575 m).   Weight as of 12/17/23: 58.5 kg.   Imaging Review Plain radiographs demonstrate severe degenerative joint disease of the right hip(s). The bone quality appears to be adequate for age and reported activity  level.      Assessment/Plan:  End stage arthritis, right hip(s)  The patient history, physical examination, clinical judgement of the provider and imaging studies are consistent with end stage degenerative joint disease of the right hip(s) and total hip arthroplasty is deemed medically necessary. The treatment options including medical management, injection therapy, arthroscopy and arthroplasty were discussed at length. The risks and benefits of total hip arthroplasty were presented and reviewed. The risks due to aseptic loosening, infection, stiffness, dislocation/subluxation,  thromboembolic complications and other imponderables were discussed.  The patient acknowledged the explanation, agreed to proceed with the plan and consent was signed. Patient is being admitted for inpatient treatment for surgery, pain control, PT, OT, prophylactic antibiotics, VTE prophylaxis, progressive ambulation and ADL's and discharge planning.The patient is planning to be discharged home.   Rosina Calin, PA-C Orthopedic Surgery [facility] [place] [phone]

## 2023-12-30 ENCOUNTER — Encounter (HOSPITAL_COMMUNITY): Admission: RE | Disposition: A | Payer: Self-pay | Source: Ambulatory Visit | Attending: Orthopedic Surgery

## 2023-12-30 ENCOUNTER — Other Ambulatory Visit: Payer: Self-pay

## 2023-12-30 ENCOUNTER — Observation Stay (HOSPITAL_COMMUNITY)

## 2023-12-30 ENCOUNTER — Observation Stay (HOSPITAL_COMMUNITY)
Admission: RE | Admit: 2023-12-30 | Discharge: 2023-12-31 | Disposition: A | Source: Ambulatory Visit | Attending: Orthopedic Surgery | Admitting: Orthopedic Surgery

## 2023-12-30 ENCOUNTER — Ambulatory Visit (HOSPITAL_COMMUNITY)

## 2023-12-30 ENCOUNTER — Encounter (HOSPITAL_COMMUNITY): Payer: Self-pay | Admitting: Orthopedic Surgery

## 2023-12-30 ENCOUNTER — Ambulatory Visit (HOSPITAL_COMMUNITY): Payer: Self-pay | Admitting: Physician Assistant

## 2023-12-30 ENCOUNTER — Ambulatory Visit (HOSPITAL_COMMUNITY): Admitting: Anesthesiology

## 2023-12-30 DIAGNOSIS — I10 Essential (primary) hypertension: Secondary | ICD-10-CM

## 2023-12-30 DIAGNOSIS — M1611 Unilateral primary osteoarthritis, right hip: Principal | ICD-10-CM

## 2023-12-30 DIAGNOSIS — Z96641 Presence of right artificial hip joint: Secondary | ICD-10-CM

## 2023-12-30 DIAGNOSIS — Z79899 Other long term (current) drug therapy: Secondary | ICD-10-CM | POA: Diagnosis not present

## 2023-12-30 DIAGNOSIS — Z85828 Personal history of other malignant neoplasm of skin: Secondary | ICD-10-CM | POA: Diagnosis not present

## 2023-12-30 DIAGNOSIS — J45909 Unspecified asthma, uncomplicated: Secondary | ICD-10-CM | POA: Diagnosis not present

## 2023-12-30 DIAGNOSIS — Z87891 Personal history of nicotine dependence: Secondary | ICD-10-CM

## 2023-12-30 DIAGNOSIS — M25551 Pain in right hip: Secondary | ICD-10-CM | POA: Diagnosis present

## 2023-12-30 HISTORY — PX: TOTAL HIP ARTHROPLASTY: SHX124

## 2023-12-30 LAB — ABO/RH: ABO/RH(D): A POS

## 2023-12-30 SURGERY — ARTHROPLASTY, HIP, TOTAL, ANTERIOR APPROACH
Anesthesia: Spinal | Site: Hip | Laterality: Right

## 2023-12-30 MED ORDER — DEXMEDETOMIDINE HCL IN NACL 80 MCG/20ML IV SOLN
INTRAVENOUS | Status: DC | PRN
  Administered 2023-12-30 (×2): 4 ug via INTRAVENOUS

## 2023-12-30 MED ORDER — SENNA 8.6 MG PO TABS
2.0000 | ORAL_TABLET | Freq: Every day | ORAL | Status: DC
  Administered 2023-12-30: 17.2 mg via ORAL
  Filled 2023-12-30: qty 2

## 2023-12-30 MED ORDER — ONDANSETRON HCL 4 MG/2ML IJ SOLN: 4.0000 mg | Freq: Once | INTRAMUSCULAR | Status: DC | PRN

## 2023-12-30 MED ORDER — LORATADINE 10 MG PO TABS
10.0000 mg | ORAL_TABLET | Freq: Every day | ORAL | Status: DC
  Administered 2023-12-30: 10 mg via ORAL
  Filled 2023-12-30 (×2): qty 1

## 2023-12-30 MED ORDER — CYCLOBENZAPRINE HCL 5 MG PO TABS
5.0000 mg | ORAL_TABLET | Freq: Three times a day (TID) | ORAL | Status: DC | PRN
  Administered 2023-12-30: 5 mg via ORAL
  Filled 2023-12-30: qty 1

## 2023-12-30 MED ORDER — ACETAMINOPHEN 325 MG PO TABS: 325.0000 mg | ORAL_TABLET | Freq: Four times a day (QID) | ORAL | Status: DC | PRN

## 2023-12-30 MED ORDER — DIAZEPAM 5 MG PO TABS: 5.0000 mg | ORAL_TABLET | Freq: Every day | ORAL | Status: DC | PRN

## 2023-12-30 MED ORDER — POVIDONE-IODINE 10 % EX SWAB: 2.0000 | Freq: Once | CUTANEOUS | Status: DC

## 2023-12-30 MED ORDER — ACETAMINOPHEN 500 MG PO TABS
1000.0000 mg | ORAL_TABLET | Freq: Four times a day (QID) | ORAL | Status: DC
  Administered 2023-12-30 – 2023-12-31 (×4): 1000 mg via ORAL
  Filled 2023-12-30 (×4): qty 2

## 2023-12-30 MED ORDER — BEMPEDOIC ACID 180 MG PO TABS: 180.0000 mg | ORAL_TABLET | Freq: Every day | ORAL | Status: DC

## 2023-12-30 MED ORDER — PROPOFOL 10 MG/ML IV BOLUS
INTRAVENOUS | Status: AC
  Filled 2023-12-30: qty 20

## 2023-12-30 MED ORDER — FENTANYL CITRATE (PF) 50 MCG/ML IJ SOSY
25.0000 ug | PREFILLED_SYRINGE | INTRAMUSCULAR | Status: DC | PRN
  Administered 2023-12-30: 50 ug via INTRAVENOUS

## 2023-12-30 MED ORDER — SODIUM CHLORIDE (PF) 0.9 % IJ SOLN
INTRAMUSCULAR | Status: DC | PRN
  Administered 2023-12-30: 61 mL

## 2023-12-30 MED ORDER — PROPOFOL 500 MG/50ML IV EMUL
INTRAVENOUS | Status: DC | PRN
  Administered 2023-12-30: 40 ug/kg/min via INTRAVENOUS

## 2023-12-30 MED ORDER — MENTHOL 3 MG MT LOZG: 1.0000 | LOZENGE | OROMUCOSAL | Status: DC | PRN

## 2023-12-30 MED ORDER — LOSARTAN POTASSIUM 25 MG PO TABS
25.0000 mg | ORAL_TABLET | Freq: Every day | ORAL | Status: DC
  Administered 2023-12-31: 25 mg via ORAL
  Filled 2023-12-30: qty 1

## 2023-12-30 MED ORDER — TRANEXAMIC ACID-NACL 1000-0.7 MG/100ML-% IV SOLN
1000.0000 mg | Freq: Once | INTRAVENOUS | Status: AC
  Administered 2023-12-30: 1000 mg via INTRAVENOUS
  Filled 2023-12-30: qty 100

## 2023-12-30 MED ORDER — ONDANSETRON HCL 4 MG/2ML IJ SOLN: 4.0000 mg | Freq: Four times a day (QID) | INTRAMUSCULAR | Status: DC | PRN

## 2023-12-30 MED ORDER — FENTANYL CITRATE (PF) 100 MCG/2ML IJ SOLN
INTRAMUSCULAR | Status: DC | PRN
  Administered 2023-12-30: 12.5 ug via INTRAVENOUS
  Administered 2023-12-30: 50 ug via INTRAVENOUS
  Administered 2023-12-30: 12.5 ug via INTRAVENOUS

## 2023-12-30 MED ORDER — ORAL CARE MOUTH RINSE: 15.0000 mL | Freq: Once | OROMUCOSAL | Status: AC

## 2023-12-30 MED ORDER — SODIUM CHLORIDE (PF) 0.9 % IJ SOLN
INTRAMUSCULAR | Status: AC
  Filled 2023-12-30: qty 30

## 2023-12-30 MED ORDER — PHENOL 1.4 % MT LIQD: 1.0000 | OROMUCOSAL | Status: DC | PRN

## 2023-12-30 MED ORDER — ONDANSETRON HCL 4 MG PO TABS: 4.0000 mg | ORAL_TABLET | Freq: Four times a day (QID) | ORAL | Status: DC | PRN

## 2023-12-30 MED ORDER — AMISULPRIDE (ANTIEMETIC) 5 MG/2ML IV SOLN: 10.0000 mg | Freq: Once | INTRAVENOUS | Status: DC | PRN

## 2023-12-30 MED ORDER — ONDANSETRON HCL 4 MG/2ML IJ SOLN
INTRAMUSCULAR | Status: DC | PRN
  Administered 2023-12-30: 4 mg via INTRAVENOUS

## 2023-12-30 MED ORDER — DARIFENACIN HYDROBROMIDE ER 7.5 MG PO TB24
7.5000 mg | ORAL_TABLET | Freq: Every day | ORAL | Status: DC
  Administered 2023-12-30 – 2023-12-31 (×2): 7.5 mg via ORAL
  Filled 2023-12-30 (×2): qty 1

## 2023-12-30 MED ORDER — LACTATED RINGERS IV SOLN: INTRAVENOUS | Status: DC

## 2023-12-30 MED ORDER — ASPIRIN 81 MG PO CHEW
81.0000 mg | CHEWABLE_TABLET | Freq: Two times a day (BID) | ORAL | Status: DC
  Administered 2023-12-30 – 2023-12-31 (×2): 81 mg via ORAL
  Filled 2023-12-30 (×2): qty 1

## 2023-12-30 MED ORDER — CHLORHEXIDINE GLUCONATE 0.12 % MT SOLN
15.0000 mL | Freq: Once | OROMUCOSAL | Status: AC
  Administered 2023-12-30: 15 mL via OROMUCOSAL

## 2023-12-30 MED ORDER — ACETAMINOPHEN 10 MG/ML IV SOLN: 1000.0000 mg | Freq: Once | INTRAVENOUS | Status: DC | PRN

## 2023-12-30 MED ORDER — DEXAMETHASONE SOD PHOSPHATE PF 10 MG/ML IJ SOLN
8.0000 mg | Freq: Once | INTRAMUSCULAR | Status: AC
  Administered 2023-12-30: 8 mg via INTRAVENOUS

## 2023-12-30 MED ORDER — DIPHENHYDRAMINE HCL 12.5 MG/5ML PO ELIX: 12.5000 mg | ORAL_SOLUTION | ORAL | Status: DC | PRN

## 2023-12-30 MED ORDER — TRAMADOL HCL 50 MG PO TABS
50.0000 mg | ORAL_TABLET | Freq: Four times a day (QID) | ORAL | Status: DC | PRN
  Administered 2023-12-30: 50 mg via ORAL
  Filled 2023-12-30: qty 1

## 2023-12-30 MED ORDER — DEXMEDETOMIDINE HCL IN NACL 80 MCG/20ML IV SOLN
INTRAVENOUS | Status: AC
  Filled 2023-12-30: qty 20

## 2023-12-30 MED ORDER — KETOROLAC TROMETHAMINE 30 MG/ML IJ SOLN
INTRAMUSCULAR | Status: AC
  Filled 2023-12-30: qty 1

## 2023-12-30 MED ORDER — BUPIVACAINE IN DEXTROSE 0.75-8.25 % IT SOLN
INTRATHECAL | Status: DC | PRN
  Administered 2023-12-30: 1.8 mL via INTRATHECAL

## 2023-12-30 MED ORDER — CEFAZOLIN SODIUM-DEXTROSE 2-4 GM/100ML-% IV SOLN
2.0000 g | Freq: Four times a day (QID) | INTRAVENOUS | Status: AC
  Administered 2023-12-30 – 2023-12-31 (×2): 2 g via INTRAVENOUS
  Filled 2023-12-30 (×2): qty 100

## 2023-12-30 MED ORDER — FENTANYL CITRATE (PF) 50 MCG/ML IJ SOSY
PREFILLED_SYRINGE | INTRAMUSCULAR | Status: AC
  Filled 2023-12-30: qty 1

## 2023-12-30 MED ORDER — DEXAMETHASONE SOD PHOSPHATE PF 10 MG/ML IJ SOLN
10.0000 mg | Freq: Once | INTRAMUSCULAR | Status: AC
  Administered 2023-12-31: 10 mg via INTRAVENOUS

## 2023-12-30 MED ORDER — MUPIROCIN 2 % EX OINT: 1.0000 | TOPICAL_OINTMENT | Freq: Two times a day (BID) | CUTANEOUS | 0 refills | Status: DC

## 2023-12-30 MED ORDER — PHENYLEPHRINE HCL-NACL 20-0.9 MG/250ML-% IV SOLN
INTRAVENOUS | Status: DC | PRN
  Administered 2023-12-30: 20 ug/min via INTRAVENOUS

## 2023-12-30 MED ORDER — ONDANSETRON HCL 4 MG/2ML IJ SOLN
INTRAMUSCULAR | Status: AC
  Filled 2023-12-30: qty 2

## 2023-12-30 MED ORDER — SODIUM CHLORIDE 0.9 % IV SOLN: INTRAVENOUS | Status: DC

## 2023-12-30 MED ORDER — ALUM & MAG HYDROXIDE-SIMETH 200-200-20 MG/5ML PO SUSP: 30.0000 mL | ORAL | Status: DC | PRN

## 2023-12-30 MED ORDER — HYDROMORPHONE HCL 1 MG/ML IJ SOLN: 0.5000 mg | INTRAMUSCULAR | Status: DC | PRN

## 2023-12-30 MED ORDER — METOCLOPRAMIDE HCL 5 MG PO TABS: 5.0000 mg | ORAL_TABLET | Freq: Three times a day (TID) | ORAL | Status: DC | PRN

## 2023-12-30 MED ORDER — FENTANYL CITRATE (PF) 100 MCG/2ML IJ SOLN
INTRAMUSCULAR | Status: AC
  Filled 2023-12-30: qty 2

## 2023-12-30 MED ORDER — ROSUVASTATIN CALCIUM 20 MG PO TABS
20.0000 mg | ORAL_TABLET | Freq: Every day | ORAL | Status: DC
  Administered 2023-12-30: 20 mg via ORAL
  Filled 2023-12-30 (×2): qty 1

## 2023-12-30 MED ORDER — BUPIVACAINE-EPINEPHRINE (PF) 0.25% -1:200000 IJ SOLN
INTRAMUSCULAR | Status: AC
  Filled 2023-12-30: qty 30

## 2023-12-30 MED ORDER — FLUTICASONE FUROATE-VILANTEROL 200-25 MCG/ACT IN AEPB
1.0000 | INHALATION_SPRAY | Freq: Every day | RESPIRATORY_TRACT | Status: DC
  Administered 2023-12-31: 1 via RESPIRATORY_TRACT
  Filled 2023-12-30: qty 28

## 2023-12-30 MED ORDER — PROPOFOL 10 MG/ML IV BOLUS
INTRAVENOUS | Status: DC | PRN
  Administered 2023-12-30 (×2): 20 mg via INTRAVENOUS

## 2023-12-30 MED ORDER — CEFAZOLIN SODIUM-DEXTROSE 2-4 GM/100ML-% IV SOLN
2.0000 g | INTRAVENOUS | Status: AC
  Administered 2023-12-30: 2 g via INTRAVENOUS
  Filled 2023-12-30: qty 100

## 2023-12-30 MED ORDER — LIDOCAINE HCL (PF) 2 % IJ SOLN
INTRAMUSCULAR | Status: AC
  Filled 2023-12-30: qty 5

## 2023-12-30 MED ORDER — POLYETHYLENE GLYCOL 3350 17 G PO PACK
17.0000 g | PACK | Freq: Two times a day (BID) | ORAL | Status: DC
  Administered 2023-12-30: 17 g via ORAL
  Filled 2023-12-30 (×2): qty 1

## 2023-12-30 MED ORDER — PROPOFOL 1000 MG/100ML IV EMUL
INTRAVENOUS | Status: AC
  Filled 2023-12-30: qty 100

## 2023-12-30 MED ORDER — METOCLOPRAMIDE HCL 5 MG/ML IJ SOLN: 5.0000 mg | Freq: Three times a day (TID) | INTRAMUSCULAR | Status: DC | PRN

## 2023-12-30 MED ORDER — PANTOPRAZOLE SODIUM 40 MG PO TBEC
40.0000 mg | DELAYED_RELEASE_TABLET | Freq: Every day | ORAL | Status: DC
  Administered 2023-12-30 – 2023-12-31 (×2): 40 mg via ORAL
  Filled 2023-12-30 (×2): qty 1

## 2023-12-30 MED ORDER — 0.9 % SODIUM CHLORIDE (POUR BTL) OPTIME
TOPICAL | Status: DC | PRN
  Administered 2023-12-30: 1000 mL

## 2023-12-30 MED ORDER — GABAPENTIN 100 MG PO CAPS
100.0000 mg | ORAL_CAPSULE | Freq: Every evening | ORAL | Status: DC | PRN
  Administered 2023-12-30: 100 mg via ORAL
  Filled 2023-12-30: qty 1

## 2023-12-30 MED ORDER — STERILE WATER FOR IRRIGATION IR SOLN
Status: DC | PRN
  Administered 2023-12-30: 2000 mL

## 2023-12-30 MED ORDER — TRANEXAMIC ACID-NACL 1000-0.7 MG/100ML-% IV SOLN
1000.0000 mg | INTRAVENOUS | Status: AC
  Administered 2023-12-30: 1000 mg via INTRAVENOUS
  Filled 2023-12-30: qty 100

## 2023-12-30 MED ORDER — LIDOCAINE HCL (CARDIAC) PF 100 MG/5ML IV SOSY
PREFILLED_SYRINGE | INTRAVENOUS | Status: DC | PRN
  Administered 2023-12-30: 40 mg via INTRAVENOUS
  Administered 2023-12-30: 20 mg via INTRAVENOUS

## 2023-12-30 MED ORDER — BISACODYL 10 MG RE SUPP: 10.0000 mg | Freq: Every day | RECTAL | Status: DC | PRN

## 2023-12-30 SURGICAL SUPPLY — 39 items
BAG COUNTER SPONGE SURGICOUNT (BAG) IMPLANT
BAG ZIPLOCK 12X15 (MISCELLANEOUS) ×2 IMPLANT
BLADE SAG 18X100X1.27 (BLADE) ×2 IMPLANT
COVER PERINEAL POST (MISCELLANEOUS) ×2 IMPLANT
COVER SURGICAL LIGHT HANDLE (MISCELLANEOUS) ×2 IMPLANT
CUP ACET EMPHA 48 CEMENTLESS (Cup) IMPLANT
DERMABOND ADVANCED .7 DNX12 (GAUZE/BANDAGES/DRESSINGS) ×2 IMPLANT
DRAPE FOOT SWITCH (DRAPES) ×2 IMPLANT
DRAPE STERI IOBAN 125X83 (DRAPES) ×2 IMPLANT
DRAPE U-SHAPE 47X51 STRL (DRAPES) ×4 IMPLANT
DRESSING AQUACEL AG SP 3.5X10 (GAUZE/BANDAGES/DRESSINGS) ×2 IMPLANT
DURAPREP 26ML APPLICATOR (WOUND CARE) ×2 IMPLANT
ELECT BLADE TIP CTD 4 INCH (ELECTRODE) IMPLANT
ELECT REM PT RETURN 15FT ADLT (MISCELLANEOUS) ×2 IMPLANT
GLOVE BIO SURGEON STRL SZ 6 (GLOVE) ×2 IMPLANT
GLOVE BIOGEL PI IND STRL 6.5 (GLOVE) ×2 IMPLANT
GLOVE BIOGEL PI IND STRL 7.5 (GLOVE) ×2 IMPLANT
GLOVE ORTHO TXT STRL SZ7.5 (GLOVE) ×4 IMPLANT
GOWN STRL REUS W/ TWL LRG LVL3 (GOWN DISPOSABLE) ×4 IMPLANT
HEAD M SROM 36MM PLUS 1.5 (Hips) IMPLANT
HOLDER FOLEY CATH W/STRAP (MISCELLANEOUS) ×2 IMPLANT
KIT TURNOVER KIT A (KITS) ×2 IMPLANT
LINER ACET EMPHA 36X48 AOX POL (Liner) IMPLANT
MANIFOLD NEPTUNE II (INSTRUMENTS) ×2 IMPLANT
NDL SAFETY ECLIPSE 18X1.5 (NEEDLE) ×2 IMPLANT
PACK ANTERIOR HIP CUSTOM (KITS) ×2 IMPLANT
PENCIL SMOKE EVACUATOR (MISCELLANEOUS) ×2 IMPLANT
SCREW 6.5MMX25MM (Screw) IMPLANT
STEM FEM ACTIS HIGH SZ3 (Stem) IMPLANT
SUT MNCRL AB 4-0 PS2 18 (SUTURE) ×2 IMPLANT
SUT VIC AB 1 CT1 36 (SUTURE) ×6 IMPLANT
SUT VIC AB 2-0 CT1 TAPERPNT 27 (SUTURE) ×4 IMPLANT
SUTURE STRATFX 0 PDS 27 VIOLET (SUTURE) ×2 IMPLANT
SYR 3ML LL SCALE MARK (SYRINGE) ×2 IMPLANT
TOWEL GREEN STERILE FF (TOWEL DISPOSABLE) ×2 IMPLANT
TRAY FOLEY MTR SLVR 14FR STAT (SET/KITS/TRAYS/PACK) IMPLANT
TRAY FOLEY MTR SLVR 16FR STAT (SET/KITS/TRAYS/PACK) ×2 IMPLANT
TUBE SUCTION HIGH CAP CLEAR NV (SUCTIONS) ×2 IMPLANT
WATER STERILE IRR 1000ML POUR (IV SOLUTION) ×2 IMPLANT

## 2023-12-30 NOTE — Anesthesia Procedure Notes (Signed)
 Procedure Name: MAC Date/Time: 12/30/2023 1:35 PM  Performed by: Metta Andrea NOVAK, CRNAPre-anesthesia Checklist: Patient identified, Emergency Drugs available, Suction available, Patient being monitored and Timeout performed Oxygen Delivery Method: Simple face mask Placement Confirmation: positive ETCO2

## 2023-12-30 NOTE — Anesthesia Postprocedure Evaluation (Signed)
 Anesthesia Post Note  Patient: Diana Cisneros  Procedure(s) Performed: RIGHT TOTAL HIP ARTHROPLASTY, ANTERIOR APPROACH (Right: Hip)     Patient location during evaluation: PACU Anesthesia Type: Spinal Level of consciousness: awake Pain management: pain level controlled Vital Signs Assessment: post-procedure vital signs reviewed and stable Respiratory status: spontaneous breathing, nonlabored ventilation and respiratory function stable Cardiovascular status: blood pressure returned to baseline and stable Postop Assessment: no apparent nausea or vomiting Anesthetic complications: no   No notable events documented.  Last Vitals:  Vitals:   12/30/23 1633 12/30/23 1717  BP: (!) 163/97 (!) 147/89  Pulse: 80 81  Resp: 17 16  Temp:  36.5 C  SpO2: 100% 97%    Last Pain:  Vitals:   12/30/23 1835  TempSrc:   PainSc: 2                  Judia Arnott P Islay Polanco

## 2023-12-30 NOTE — Discharge Instructions (Signed)

## 2023-12-30 NOTE — Anesthesia Procedure Notes (Signed)
 Spinal  Patient location during procedure: OR Start time: 12/30/2023 1:35 PM End time: 12/30/2023 1:40 PM Reason for block: surgical anesthesia Staffing Performed: anesthesiologist  Anesthesiologist: Patrisha Bernardino SQUIBB, MD Performed by: Patrisha Bernardino SQUIBB, MD Authorized by: Patrisha Bernardino SQUIBB, MD   Preanesthetic Checklist Completed: patient identified, IV checked, risks and benefits discussed, surgical consent, monitors and equipment checked, pre-op evaluation and timeout performed Spinal Block Patient position: sitting Prep: DuraPrep Patient monitoring: cardiac monitor, continuous pulse ox and blood pressure Approach: midline Location: L3-4 Injection technique: single-shot Needle Needle type: Pencan  Needle gauge: 24 G Needle length: 9 cm Assessment Sensory level: T10 Events: CSF return Additional Notes Functioning IV was confirmed and monitors were applied. Sterile prep and drape, including hand hygiene and sterile gloves were used. The patient was positioned and the spine was prepped. The skin was anesthetized with lidocaine .  Free flow of clear CSF was obtained prior to injecting local anesthetic into the CSF.  The spinal needle aspirated freely following injection.  The needle was carefully withdrawn.  The patient tolerated the procedure well.

## 2023-12-30 NOTE — Care Plan (Signed)
 Ortho Bundle Case Management Note  Patient Details  Name: Diana Cisneros MRN: [id number] Date of Birth: [birth year]  RT THA on 12/30/23  DCP: Home with husband  DME: RW; ordered through [employer]  PT: HEP                   DME Arranged:  Walker rolling DME Agency:  [employer]  HH Arranged:    HH Agency:     Additional Comments: Please contact me with any questions of if this plan should need to change.  Burnard Dross, Case Manager [facility] [phone]  Ext. [phone]   12/30/2023, 12:50 PM

## 2023-12-30 NOTE — Interval H&P Note (Signed)
 History and Physical Interval Note:  12/30/2023 1:27 PM  Diana Cisneros  has presented today for surgery, with the diagnosis of Right hip osteoarthritis.  The various methods of treatment have been discussed with the patient and family. After consideration of risks, benefits and other options for treatment, the patient has consented to  Procedure(s): ARTHROPLASTY, HIP, TOTAL, ANTERIOR APPROACH (Right) as a surgical intervention.  The patient's history has been reviewed, patient examined, no change in status, stable for surgery.  I have reviewed the patient's chart and labs.  Questions were answered to the patient's satisfaction.     [name]

## 2023-12-30 NOTE — Op Note (Signed)
 NAME:  Diana Cisneros                ACCOUNT NO.: [id number]      MEDICAL RECORD NO.: [id number]      FACILITY:  [facility]      PHYSICIAN:  [name]  DATE OF BIRTH:  [birth year]     DATE OF PROCEDURE:  12/30/2023                                 OPERATIVE REPORT         PREOPERATIVE DIAGNOSIS: Right  hip osteoarthritis.      POSTOPERATIVE DIAGNOSIS:  Right hip osteoarthritis.      PROCEDURE:  Right total hip replacement through an anterior approach   utilizing DePuy THR system, component size 48 mm Emphasys cup, a size 36+4 neutral AOX liner, a size 3Hi Actis stem with a 36+1.5 Articuleze metal head ball.      SURGEON:  Donnice JONETTA. Car, M.D.      ASSISTANT:  Rosina Calin, PA-C     ANESTHESIA:  Spinal.      SPECIMENS:  None.      COMPLICATIONS:  None.      BLOOD LOSS:  400 cc     DRAINS:  None.      INDICATION OF THE PROCEDURE:  Diana Cisneros is a 78 y.o. female who had   presented to office for evaluation of right hip pain.  Radiographs revealed   progressive degenerative changes with bone-on-bone   articulation of the  hip joint, including subchondral cystic changes and osteophytes.  The patient had painful limited range of   motion significantly affecting their overall quality of life and function.  The patient was failing to    respond to conservative measures including medications and/or injections and activity modification and at this point was ready   to proceed with more definitive measures.  Consent was obtained for   benefit of pain relief.  Specific risks of infection, DVT, component   failure, dislocation, neurovascular injury, and need for revision surgery were reviewed in the office as well discussion of   the anterior versus posterior approach were reviewed.     PROCEDURE IN DETAIL:  The patient was brought to operative theater.   Once adequate anesthesia, preoperative antibiotics, 2 gm of Ancef, 1 gm of Tranexamic Acid, and 10 mg of  Decadron were administered, the patient was positioned supine on the [employer] table.  Once the patient was safely positioned with adequate padding of boney prominences we predraped out the hip, and used fluoroscopy to confirm orientation of the pelvis.      The right hip was then prepped and draped from proximal iliac crest to   mid thigh with a shower curtain technique.      Time-out was performed identifying the patient, planned procedure, and the appropriate extremity.     An incision was then made 2 cm lateral to the   anterior superior iliac spine extending over the orientation of the   tensor fascia lata muscle and sharp dissection was carried down to the   fascia of the muscle.      The fascia was then incised.  The muscle belly was identified and swept   laterally and retractor placed along the superior neck.  Following   cauterization of the circumflex vessels and removing some pericapsular   fat,  a second cobra retractor was placed on the inferior neck.  A T-capsulotomy was made along the line of the   superior neck to the trochanteric fossa, then extended proximally and   distally.  Tag sutures were placed and the retractors were then placed   intracapsular.  We then identified the trochanteric fossa and   orientation of my neck cut and then made a neck osteotomy with the femur on traction.  The femoral   head was removed without difficulty or complication.  Traction was let   off and retractors were placed posterior and anterior around the   acetabulum.      The labrum and foveal tissue were debrided.  I began reaming with a 44 mm   reamer and reamed up to 48 mm reamer with good bony bed preparation and a 48 mm  cup was chosen.  The final 48 mm Emphasys cup was then impacted under fluoroscopy to confirm the depth of penetration and orientation with respect to   Abduction and forward flexion.  A screw was placed into the ilium followed by the hole eliminator.  The final   36+4  neutral Altrex liner was impacted with good visualized rim fit.  The cup was positioned anatomically within the acetabular portion of the pelvis.      At this point, the femur was rolled to 100 degrees.  Further capsule was   released off the inferior aspect of the femoral neck.  I then   released the superior capsule proximally.  With the leg in a neutral position the hook was placed laterally   along the femur under the vastus lateralis origin and elevated manually and then held in position using the hook attachment on the bed.  The leg was then extended and adducted with the leg rolled to 100   degrees of external rotation.  Retractors were placed along the medial calcar and posteriorly over the greater trochanter.  Once the proximal femur was fully   exposed, I used a box osteotome to set orientation.  I then began   broaching with the starting chili pepper broach and passed this by hand and then broached up to 3.  With the 3 broach in place I chose a high offset neck and did several trial reductions.  The offset was appropriate, leg lengths   appeared to be equal best matched with the +1.5 head ball trial confirmed radiographically.   Given these findings, I went ahead and dislocated the hip, repositioned all   retractors and positioned the right hip in the extended and abducted position.  The final 3 Hi Actis stem was   chosen and it was impacted down to the level of neck cut.  Based on this   and the trial reductions, a final 36+1.5 Articuleze metal head ball was chosen and   impacted onto a clean and dry trunnion, and the hip was reduced.  The   hip had been irrigated throughout the case again at this point.  I did   reapproximate the superior capsular leaflet to the anterior leaflet   using #1 Vicryl.  The fascia of the   tensor fascia lata muscle was then reapproximated using #1 Vicryl and #0 Stratafix sutures.  The   remaining wound was closed with 2-0 Vicryl and running 4-0 Monocryl.    The hip was cleaned, dried, and dressed sterilely using Dermabond and   Aquacel dressing.  The patient was then brought   to recovery room in  stable condition tolerating the procedure well.    Rosina Calin, PA-V was present for the entirety of the case involved from   preoperative positioning, perioperative retractor management, general   facilitation of the case, as well as primary wound closure as assistant.            Donnice CORDOBA Ernie, M.D.        12/30/2023 1:37 PM

## 2023-12-30 NOTE — Anesthesia Preprocedure Evaluation (Signed)
 Anesthesia Evaluation  Patient identified by MRN, date of birth, ID band Patient awake    Reviewed: Allergy  & Precautions, NPO status , Patient's Chart, lab work & pertinent test results  Airway Mallampati: II  TM Distance: >3 FB Neck ROM: Full    Dental no notable dental hx.    Pulmonary asthma , former smoker   Pulmonary exam normal        Cardiovascular hypertension, Pt. on medications Normal cardiovascular exam     Neuro/Psych   Anxiety      Neuromuscular disease    GI/Hepatic negative GI ROS, Neg liver ROS,,,  Endo/Other  negative endocrine ROS    Renal/GU Renal disease     Musculoskeletal  (+) Arthritis ,    Abdominal   Peds  Hematology negative hematology ROS (+)   Anesthesia Other Findings Right hip osteoarthritis  Reproductive/Obstetrics                              Anesthesia Physical Anesthesia Plan  ASA: 3  Anesthesia Plan: Spinal   Post-op Pain Management:    Induction:   PONV Risk Score and Plan: 2 and Ondansetron , Dexamethasone , Propofol  infusion and Treatment may vary due to age or medical condition  Airway Management Planned: Simple Face Mask  Additional Equipment:   Intra-op Plan:   Post-operative Plan:   Informed Consent: I have reviewed the patients History and Physical, chart, labs and discussed the procedure including the risks, benefits and alternatives for the proposed anesthesia with the patient or authorized representative who has indicated his/her understanding and acceptance.     Dental advisory given  Plan Discussed with: CRNA  Anesthesia Plan Comments:         Anesthesia Quick Evaluation

## 2023-12-30 NOTE — Transfer of Care (Signed)
 Immediate Anesthesia Transfer of Care Note  Patient: Diana Cisneros  Procedure(s) Performed: RIGHT TOTAL HIP ARTHROPLASTY, ANTERIOR APPROACH (Right: Hip)  Patient Location: PACU  Anesthesia Type:MAC and Spinal  Level of Consciousness: awake, alert , and oriented  Airway & Oxygen Therapy: Patient Spontanous Breathing and Patient connected to face mask oxygen  Post-op Assessment: Report given to RN and Post -op Vital signs reviewed and stable  Post vital signs: Reviewed and stable  Last Vitals:  Vitals Value Taken Time  BP 146/79 12/30/23 15:15  Temp    Pulse 79 12/30/23 15:18  Resp 10 12/30/23 15:18  SpO2 93 % 12/30/23 15:18  Vitals shown include unfiled device data.  Last Pain:  Vitals:   12/30/23 0921  TempSrc: Oral         Complications: No notable events documented.

## 2023-12-31 ENCOUNTER — Encounter (HOSPITAL_COMMUNITY): Payer: Self-pay | Admitting: Orthopedic Surgery

## 2023-12-31 ENCOUNTER — Other Ambulatory Visit (HOSPITAL_COMMUNITY): Payer: Self-pay

## 2023-12-31 DIAGNOSIS — M1611 Unilateral primary osteoarthritis, right hip: Secondary | ICD-10-CM | POA: Diagnosis not present

## 2023-12-31 LAB — CBC
HCT: 32.9 % — ABNORMAL LOW (ref 36.0–46.0)
Hemoglobin: 10.8 g/dL — ABNORMAL LOW (ref 12.0–15.0)
MCH: 30.4 pg (ref 26.0–34.0)
MCHC: 32.8 g/dL (ref 30.0–36.0)
MCV: 92.7 fL (ref 80.0–100.0)
Platelets: 175 K/uL (ref 150–400)
RBC: 3.55 MIL/uL — ABNORMAL LOW (ref 3.87–5.11)
RDW: 14 % (ref 11.5–15.5)
WBC: 10.2 K/uL (ref 4.0–10.5)
nRBC: 0 % (ref 0.0–0.2)

## 2023-12-31 LAB — BASIC METABOLIC PANEL WITH GFR
Anion gap: 7 (ref 5–15)
BUN: 19 mg/dL (ref 8–23)
CO2: 25 mmol/L (ref 22–32)
Calcium: 8.9 mg/dL (ref 8.9–10.3)
Chloride: 104 mmol/L (ref 98–111)
Creatinine, Ser: 0.9 mg/dL (ref 0.44–1.00)
GFR, Estimated: >60 mL/min (ref >60)
Glucose, Bld: 138 mg/dL — ABNORMAL HIGH (ref 70–99)
Potassium: 4.5 mmol/L (ref 3.5–5.1)
Sodium: 135 mmol/L (ref 135–145)

## 2023-12-31 MED ORDER — CELECOXIB 200 MG PO CAPS
200.0000 mg | ORAL_CAPSULE | Freq: Two times a day (BID) | ORAL | 0 refills | Status: AC
  Filled 2023-12-31: qty 60, 30d supply, fill #0

## 2023-12-31 MED ORDER — SENNA 8.6 MG PO TABS
2.0000 | ORAL_TABLET | Freq: Every day | ORAL | 0 refills | Status: AC
  Filled 2023-12-31: qty 28, 14d supply, fill #0

## 2023-12-31 MED ORDER — CYCLOBENZAPRINE HCL 5 MG PO TABS
5.0000 mg | ORAL_TABLET | Freq: Three times a day (TID) | ORAL | 0 refills | Status: AC | PRN
  Filled 2023-12-31: qty 40, 7d supply, fill #0

## 2023-12-31 MED ORDER — MUPIROCIN 2 % EX OINT
1.0000 | TOPICAL_OINTMENT | Freq: Two times a day (BID) | CUTANEOUS | 0 refills | Status: AC
  Filled 2023-12-31: qty 22, 11d supply, fill #0

## 2023-12-31 MED ORDER — CELECOXIB 200 MG PO CAPS
200.0000 mg | ORAL_CAPSULE | Freq: Two times a day (BID) | ORAL | Status: DC
  Administered 2023-12-31: 200 mg via ORAL
  Filled 2023-12-31: qty 1

## 2023-12-31 MED ORDER — POLYETHYLENE GLYCOL 3350 17 GM/SCOOP PO POWD
17.0000 g | Freq: Two times a day (BID) | ORAL | 0 refills | Status: AC
  Filled 2023-12-31: qty 238, 7d supply, fill #0

## 2023-12-31 MED ORDER — ASPIRIN 81 MG PO CHEW
81.0000 mg | CHEWABLE_TABLET | Freq: Two times a day (BID) | ORAL | 0 refills | Status: AC
  Filled 2023-12-31: qty 56, 28d supply, fill #0

## 2023-12-31 MED ORDER — ACETAMINOPHEN 500 MG PO TABS: 1000.0000 mg | ORAL_TABLET | Freq: Four times a day (QID) | ORAL | Status: AC

## 2023-12-31 MED ORDER — TRAMADOL HCL 50 MG PO TABS
50.0000 mg | ORAL_TABLET | Freq: Four times a day (QID) | ORAL | 0 refills | Status: AC | PRN
  Filled 2023-12-31: qty 30, 4d supply, fill #0

## 2023-12-31 NOTE — Progress Notes (Signed)
   Subjective: 1 Day Post-Op Procedure(s) (LRB): RIGHT TOTAL HIP ARTHROPLASTY, ANTERIOR APPROACH (Right) Patient reports pain as mild.   Patient seen in rounds with Dr. Ernie. Patient is resting in bed on exam this morning. No acute events overnight. Foley catheter removed. Patient has not been up with PT yet.  We will start therapy today.   Objective: Vital signs in last 24 hours: Temp:  [97.4 F (36.3 C)-98.1 F (36.7 C)] 98.1 F (36.7 C) (12/03 0636) Pulse Rate:  [70-100] 74 (12/03 0636) Resp:  [14-23] 15 (12/03 0636) BP: (111-181)/(66-97) 143/69 (12/03 0636) SpO2:  [89 %-100 %] 99 % (12/03 0636) Weight:  [58.5 kg] 58.5 kg (12/02 0938)  Intake/Output from previous day:  Intake/Output Summary (Last 24 hours) at 12/31/2023 0756 Last data filed at 12/31/2023 0600 Gross per 24 hour  Intake 2732.48 ml  Output 1750 ml  Net 982.48 ml     Intake/Output this shift: No intake/output data recorded.  Labs: Recent Labs    12/31/23 0327  HGB 10.8*   Recent Labs    12/31/23 0327  WBC 10.2  RBC 3.55*  HCT 32.9*  PLT 175   Recent Labs    12/31/23 0327  NA 135  K 4.5  CL 104  CO2 25  BUN 19  CREATININE 0.90  GLUCOSE 138*  CALCIUM  8.9   No results for input(s): LABPT, INR in the last 72 hours.  Exam: General - Patient is Alert and Oriented Extremity - Neurologically intact Sensation intact distally Intact pulses distally Dorsiflexion/Plantar flexion intact Dressing - dressing C/D/I Motor Function - intact, moving foot and toes well on exam.   Past Medical History:  Diagnosis Date   Allergic rhinitis    Anxiety    Anxiousness    Arthritis    Cancer (HCC)    skin/ back   Degenerative disc disease    thoracic,lumbar   Hypercholesterolemia    Hyperlipidemia    Hypertension    Low back pain    Moderate persistent asthma without complication 02/25/2018   Muscle cramps    Nail dystrophy 12/30/2018   Osteopenia 10/2018   T score -2.2 distal third of  forearm.  -1.6 left femoral neck FRAX 18% / 6.7%   Pneumonia    Seasonal allergies    Sinusitis, acute    STD (sexually transmitted disease)    chlamydia treated many yrs ago   URI (upper respiratory infection)     Assessment/Plan: 1 Day Post-Op Procedure(s) (LRB): RIGHT TOTAL HIP ARTHROPLASTY, ANTERIOR APPROACH (Right) Principal Problem:   S/P total right hip arthroplasty  Estimated body mass index is 23.59 kg/m as calculated from the following:   Height as of this encounter: 5' 2 (1.575 m).   Weight as of this encounter: 58.5 kg. Advance diet Up with therapy D/C IV fluids  DVT Prophylaxis - Aspirin Weight bearing as tolerated.  Hgb stable at 10.8 this AM  Plan is to go Home after hospital stay. Plan for discharge today after meeting goals with therapy. Follow up in the office in 2 weeks.   Rosina Calin, PA-C Orthopedic Surgery [phone] 12/31/2023, 7:56 AM

## 2023-12-31 NOTE — Care Management Obs Status (Signed)
 MEDICARE OBSERVATION STATUS NOTIFICATION   Patient Details  Name: Diana Cisneros MRN: [id number] Date of Birth: 04-29-1945   Medicare Observation Status Notification Given:  [name], LCSW 12/31/2023, 12:28 PM

## 2023-12-31 NOTE — Evaluation (Signed)
 Physical Therapy Evaluation Patient Details Name: Diana Cisneros MRN: [id number] DOB: [birth year] Today's Date: 12/31/2023  History of Present Illness  78 yo  female s/P R DAR+THA 12/30/23,  Clinical Impression  Pt admitted with above diagnosis.  Pt currently with functional limitations due to the deficits listed below (see PT Problem List). Pt will benefit from acute skilled PT to increase their independence and safety with mobility to allow discharge.     The patient demonstrated imbalance when first ambulating, improving with distance using RW. The patient  should progress to  DC after next visit in PM  to practice steps.Patient reported no pain in the right LE.      If plan is discharge home, recommend the following: A little help with walking and/or transfers;Assistance with cooking/housework;Assist for transportation;Help with stairs or ramp for entrance   Can travel by private vehicle        Equipment Recommendations Rolling walker (2 wheels)  Recommendations for Other Services       Functional Status Assessment Patient has had a recent decline in their functional status and demonstrates the ability to make significant improvements in function in a reasonable and predictable amount of time.     Precautions / Restrictions Precautions Precautions: Fall Restrictions RLE Weight Bearing Per Provider Order: Weight bearing as tolerated      Mobility  Bed Mobility Overal bed mobility: Needs Assistance Bed Mobility: Supine to Sit     Supine to sit: Supervision          Transfers Overall transfer level: Needs assistance Equipment used: Rolling walker (2 wheels) Transfers: Sit to/from Stand Sit to Stand: Min assist           General transfer comment: from bed and BSC, cues for hand  and LLE, decreased descent to sitting down when not reaching back    Ambulation/Gait Ambulation/Gait assistance: Contact guard assist, Min assist Gait Distance (Feet): 20 Feet  then(180') Assistive device: Rolling walker (2 wheels) Gait Pattern/deviations: Step-to pattern, Step-through pattern, Drifts right/left, Staggering right Gait velocity: decr     General Gait Details: initially  taking first steps noted unbalanced and required min support for balance which improved with distance walking to  [facility]  Stairs            Wheelchair Mobility     Tilt Bed    Modified Rankin (Stroke Patients Only)       Balance Overall balance assessment: Needs assistance Sitting-balance support: Feet supported, No upper extremity supported Sitting balance-Leahy Scale: Good     Standing balance support: Reliant on assistive device for balance, During functional activity, Bilateral upper extremity supported Standing balance-Leahy Scale: Poor Standing balance comment: progressed to fair                             Pertinent Vitals/Pain Pain Assessment Pain Assessment: No/denies pain    Home Living Family/patient expects to be discharged to:: Private residence Living Arrangements: Spouse/significant other Available Help at Discharge: Family;Available 24 hours/day Type of Home: House Home Access: Level entry     Alternate Level Stairs-Number of Steps: 2 to BR, flight to office Home Layout: Multi-level Home Equipment: [place] - single point      Prior Function Prior Level of Function : Independent/Modified Independent;Driving                     Extremity/Trunk Assessment   Upper Extremity Assessment Upper Extremity Assessment: Overall  WFL for tasks assessed    Lower Extremity Assessment Lower Extremity Assessment: RLE deficits/detail RLE Deficits / Details: decreased control of swing and stance when standing initially, reports intact sensation    Cervical / Trunk Assessment Cervical / Trunk Assessment: Normal  Communication   Communication Communication: No apparent difficulties    Cognition Arousal: Alert Behavior During  Therapy: WFL for tasks assessed/performed   PT - Cognitive impairments: No apparent impairments                         Following commands: Intact       Cueing       General Comments      Exercises Total Joint Exercises Ankle Circles/Pumps: AROM, Both, 10 reps Quad Sets: AROM, Right, 10 reps Short Arc Quad: AROM, Right, 10 reps Heel Slides: AAROM, Right, 10 reps Hip ABduction/ADduction: AAROM, Right, 10 reps Long Arc Quad: AROM, Right, 10 reps   Assessment/Plan    PT Assessment Patient needs continued PT services  PT Problem List Decreased strength;Decreased balance;Decreased mobility;Decreased activity tolerance;Decreased safety awareness       PT Treatment Interventions DME instruction;Gait training;Stair training;Functional mobility training;Therapeutic activities;Therapeutic exercise;Patient/family education    PT Goals (Current goals can be found in the Care Plan section)  Acute Rehab PT Goals Patient Stated Goal: go home PT Goal Formulation: With patient Time For Goal Achievement: 01/07/24 Potential to Achieve Goals: Good    Frequency 7X/week     Co-evaluation               AM-PAC PT 6 Clicks Mobility  Outcome Measure Help needed turning from your back to your side while in a flat bed without using bedrails?: None Help needed moving from lying on your back to sitting on the side of a flat bed without using bedrails?: None Help needed moving to and from a bed to a chair (including a wheelchair)?: A Little Help needed standing up from a chair using your arms (e.g., wheelchair or bedside chair)?: A Little Help needed to walk in hospital room?: A Little Help needed climbing 3-5 steps with a railing? : A Little 6 Click Score: 20    End of Session Equipment Utilized During Treatment: Gait belt Activity Tolerance: Patient tolerated treatment well Patient left: in chair;with call bell/phone within reach;with chair alarm set Nurse  Communication: Mobility status PT Visit Diagnosis: Unsteadiness on feet (R26.81)    Time: 8890-8864 PT Time Calculation (min) (ACUTE ONLY): 26 min   Charges:   PT Evaluation $PT Eval Low Complexity: 1 Low PT Treatments $Gait Training: 8-22 mins PT General Charges $$ ACUTE PT VISIT: 1 Visit         Darice Potters PT Acute Rehabilitation Services Office [phone]   Potters Darice Norris 12/31/2023, 12:30 PM

## 2023-12-31 NOTE — Telephone Encounter (Signed)
 Call to patient. Patient currently hospitalized as she had a hip replacement yesterday. Patient asking how long she needs to wait before taking the Jubbonti injection? RN advised would review with Dr. Glennon and return call. Patient agreeable.

## 2023-12-31 NOTE — Progress Notes (Signed)
 Physical Therapy Treatment Patient Details Name: Diana Cisneros MRN: [id number] DOB: Feb 18, 1945 Today's Date: 12/31/2023   History of Present Illness 78 yo  female s/p R Direct anterior THA 12/30/23,    PT Comments   The patient is anxious for Dc. Patient  instructed to continue use of RW until she ha=s MD visit. Patient noted to  step from RW, given safety cues. Patient acknowledges.  Patient has met PT goals for DC.    If plan is discharge home, recommend the following: A little help with walking and/or transfers;Assistance with cooking/housework;Assist for transportation;Help with stairs or ramp for entrance   Can travel by private vehicle        Equipment Recommendations  Rolling walker (2 wheels)    Recommendations for Other Services       Precautions / Restrictions Precautions Precautions: Fall Restrictions RLE Weight Bearing Per Provider Order: Weight bearing as tolerated     Mobility  Bed Mobility Overal bed mobility: Needs Assistance Bed Mobility:     Supine to sit: Supervision     General bed mobility comments: in recliner    Transfers Overall transfer level: Needs assistance Equipment used: Rolling walker (2 wheels) Transfers: Sit to/from Stand Sit to Stand: Supervision           General transfer comment: from bed and BSC, cues for hand  and LLE,    Ambulation/Gait Ambulation/Gait assistance: Contact guard assist Gait Distance (Feet): 20 Feet (then 80) Assistive device: Rolling walker (2 wheels) Gait Pattern/deviations: Step-to pattern, Step-through pattern Gait velocity: decr     General Gait Details: cues for safety, noted RW was caught on furniture, cues to slow down,   Stairs Stairs: Yes Stairs assistance: Supervision Stair Management: Step to pattern, Forwards, With cane, One rail Right   General stair comments: performed 2 and 3 x 2 practices, spouse present   Wheelchair Mobility     Tilt Bed    Modified Rankin (Stroke  Patients Only)       Balance Overall balance assessment: Mild deficits observed, not formally tested Sitting-balance support: Feet supported, No upper extremity supported Sitting balance-Leahy Scale: Good     Standing balance support: Reliant on assistive device for balance, During functional activity, Bilateral upper extremity supported Standing balance-Leahy Scale: Poor Standing balance comment: progressed to fair                            Communication Communication Communication: No apparent difficulties  Cognition Arousal: Alert Behavior During Therapy: WFL for tasks assessed/performed   PT - Cognitive impairments: No apparent impairments                       PT - Cognition Comments: cues for safety,  walking away from RW, cautioned to stick woth RW until MD visit Following commands: Intact      Cueing    Exercises Total Joint Exercises Ankle Circles/Pumps: AROM, Both, 10 reps Quad Sets: AROM, Right, 10 reps Short Arc Quad: AROM, Right, 10 reps Heel Slides: AAROM, Right, 10 reps Hip ABduction/ADduction: AAROM, Right, 10 reps Long Arc Quad: AROM, Right, 10 reps    General Comments        Pertinent Vitals/Pain Pain Assessment Pain Assessment: No/denies pain    Home Living Family/patient expects to be discharged to:: Private residence Living Arrangements: Spouse/significant other Available Help at Discharge: Family;Available 24 hours/day Type of Home: House Home Access: Level entry  Alternate Level Stairs-Number of Steps: 2 to BR, flight to office Home Layout: Multi-level Home Equipment: [place] - single point      Prior Function            PT Goals (current goals can now be found in the care plan section) Acute Rehab PT Goals Patient Stated Goal: go home PT Goal Formulation: With patient Time For Goal Achievement: 01/07/24 Potential to Achieve Goals: Good Progress towards PT goals: Progressing toward goals     Frequency    7X/week      PT Plan      Co-evaluation              AM-PAC PT 6 Clicks Mobility   Outcome Measure  Help needed turning from your back to your side while in a flat bed without using bedrails?: None Help needed moving from lying on your back to sitting on the side of a flat bed without using bedrails?: None Help needed moving to and from a bed to a chair (including a wheelchair)?: A Little Help needed standing up from a chair using your arms (e.g., wheelchair or bedside chair)?: A Little Help needed to walk in hospital room?: A Little Help needed climbing 3-5 steps with a railing? : A Little 6 Click Score: 20    End of Session Equipment Utilized During Treatment: Gait belt Activity Tolerance: Patient tolerated treatment well Patient left: in chair;with call bell/phone within reach;with chair alarm set;with family/visitor present Nurse Communication: Mobility status PT Visit Diagnosis: Unsteadiness on feet (R26.81)     Time: 8557-8490 PT Time Calculation (min) (ACUTE ONLY): 27 min  Charges:    $Gait Training: 23-37 mins PT General Charges $$ ACUTE PT VISIT: 1 Visit                     Diana Potters PT Acute Rehabilitation Services Office [phone]   Potters Diana Cisneros 12/31/2023, 3:26 PM

## 2023-12-31 NOTE — TOC Transition Note (Signed)
 Transition of Care [facility]) - Discharge Note   Patient Details  Name: Diana Cisneros MRN: [id number] Date of Birth: 09-Oct-1945  Transition of Care [facility]) CM/SW Contact:  NORMAN ASPEN, LCSW Phone Number: 12/31/2023, 10:12 AM   Clinical Narrative:     Met with pt and confirming she has received youth RW to room via [employer].  Plan for HEP.  No further IP CM needs.  Final next level of care: Home/Self Care Barriers to Discharge: No Barriers Identified   Patient Goals and CMS Choice Patient states their goals for this hospitalization and ongoing recovery are:: return home          Discharge Placement                       Discharge Plan and Services Additional resources added to the After Visit Summary for                  DME Arranged: Walker rolling DME Agency: [employer]                  Social Drivers of Health (SDOH) Interventions SDOH Screenings   Food Insecurity: No Food Insecurity (12/30/2023)  Housing: Low Risk  (12/30/2023)  Transportation Needs: No Transportation Needs (12/30/2023)  Utilities: Not At Risk (12/30/2023)  Social Connections: Socially Integrated (12/30/2023)  Tobacco Use: Medium Risk (12/30/2023)     Readmission Risk Interventions     No data to display

## 2023-12-31 NOTE — Progress Notes (Signed)
 Discharge medications delivered to patient at the bedside.

## 2024-01-02 ENCOUNTER — Other Ambulatory Visit (HOSPITAL_COMMUNITY): Payer: Self-pay

## 2024-01-06 NOTE — Telephone Encounter (Signed)
 Routing to Dr. Glennon to review and advise on MyChart message from patient.

## 2024-01-08 ENCOUNTER — Other Ambulatory Visit: Payer: Self-pay | Admitting: Cardiovascular Disease

## 2024-01-12 NOTE — Discharge Summary (Signed)
 Patient ID: Diana Cisneros MRN: [id number] DOB/AGE: 78-Oct-1947 78 y.o.  Admit date: 12/30/2023 Discharge date: 12/31/2023  Admission Diagnoses:  Right hip osteoarthritis  Discharge Diagnoses:  Principal Problem:   S/P total right hip arthroplasty   Past Medical History:  Diagnosis Date   Allergic rhinitis    Anxiety    Anxiousness    Arthritis    Cancer (HCC)    skin/ back   Degenerative disc disease    thoracic,lumbar   Hypercholesterolemia    Hyperlipidemia    Hypertension    Low back pain    Moderate persistent asthma without complication 02/25/2018   Muscle cramps    Nail dystrophy 12/30/2018   Osteopenia 10/2018   T score -2.2 distal third of forearm.  -1.6 left femoral neck FRAX 18% / 6.7%   Pneumonia    Seasonal allergies    Sinusitis, acute    STD (sexually transmitted disease)    chlamydia treated many yrs ago   URI (upper respiratory infection)     Surgeries: Procedures: RIGHT TOTAL HIP ARTHROPLASTY, ANTERIOR APPROACH on 12/30/2023   Consultants:   Discharged Condition: Improved  Hospital Course: Diana Cisneros is an 78 y.o. female who was admitted 12/30/2023 for operative treatment ofS/P total right hip arthroplasty. Patient has severe unremitting pain that affects sleep, daily activities, and work/hobbies. After pre-op clearance the patient was taken to the operating room on 12/30/2023 and underwent  Procedures: RIGHT TOTAL HIP ARTHROPLASTY, ANTERIOR APPROACH.    Patient was given perioperative antibiotics:  Anti-infectives (From admission, onward)    Start     Dose/Rate Route Frequency Ordered Stop   12/30/23 2000  ceFAZolin  (ANCEF ) IVPB 2g/100 mL premix        2 g 200 mL/hr over 30 Minutes Intravenous Every 6 hours 12/30/23 1716 12/31/23 0534   12/30/23 0930  ceFAZolin  (ANCEF ) IVPB 2g/100 mL premix        2 g 200 mL/hr over 30 Minutes Intravenous On call to O.R. 12/30/23 0920 12/30/23 1343        Patient was given sequential compression devices,  early ambulation, and chemoprophylaxis to prevent DVT. Patient worked with PT and was meeting their goals regarding safe ambulation and transfers.  Patient benefited maximally from hospital stay and there were no complications.    Recent vital signs: No data found.   Recent laboratory studies: No results for input(s): WBC, HGB, HCT, PLT, NA, K, CL, CO2, BUN, CREATININE, GLUCOSE, INR, CALCIUM  in the last 72 hours.  Invalid input(s): PT, 2   Discharge Medications:   Allergies as of 12/31/2023       Reactions   Morphine And Codeine Hives   Other Swelling   Other reaction(s): rash/lips swelling   Morphine Sulfate Other (See Comments)   Other reaction(s): itching/redface   Naproxen Sodium Rash   Zetia  [ezetimibe ]    Muscle pain and leg cramps on 10mg  daily, 5mg  daily, and 5mg  every other day        Medication List     STOP taking these medications    diclofenac 75 MG EC tablet Commonly known as: VOLTAREN   methocarbamol 750 MG tablet Commonly known as: ROBAXIN       TAKE these medications    acetaminophen  500 MG tablet Commonly known as: TYLENOL  Take 2 tablets (1,000 mg total) by mouth every 6 (six) hours.   alendronate  70 MG tablet Commonly known as: FOSAMAX  Take 1 tablet (70 mg total) by mouth every 7 (seven) days. Take  first thing in am with 6 oz. Water .  Be upright after taking.  Eat nothing for one hour.   Aspirin  Low Dose 81 MG chewable tablet Generic drug: aspirin  Chew 1 tablet (81 mg total) by mouth 2 (two) times daily for 28 days.   augmented betamethasone  dipropionate 0.05 % cream Commonly known as: DIPROLENE -AF APPLY TO AFFECTED AREA AS NEEDED   Breo Ellipta  200-25 MCG/ACT Aepb Generic drug: fluticasone  furoate-vilanterol TAKE 1 PUFF BY MOUTH EVERY DAY   celecoxib  200 MG capsule Commonly known as: CELEBREX  Take 1 capsule (200 mg total) by mouth 2 (two) times daily. Use in place of diclofenac while on aspirin     cyclobenzaprine  5 MG tablet Commonly known as: FLEXERIL  Take 1-2 tablets (5-10 mg total) by mouth 3 (three) times daily as needed for muscle spasms (thigh pain).   darifenacin  7.5 MG 24 hr tablet Commonly known as: Enablex  Take 1 tablet (7.5 mg total) by mouth daily. ONE PO QD   denosumab  60 MG/ML Sosy injection Commonly known as: PROLIA  Inject 60 mg into the skin every 6 (six) months.   diazepam  5 MG tablet Commonly known as: VALIUM  Take 5 mg by mouth daily as needed for anxiety or muscle spasms.   gabapentin  100 MG capsule Commonly known as: NEURONTIN  Take 100-300 mg by mouth at bedtime as needed (pain or insomnia).   loratadine  10 MG tablet Commonly known as: CLARITIN  Take 1 tablet (10 mg total) by mouth daily.   losartan  25 MG tablet Commonly known as: COZAAR  Take 25 mg by mouth daily.   Magnesium 300 MG Caps Take 300 mg by mouth at bedtime.   mupirocin  ointment 2 % Commonly known as: BACTROBAN  Place 1 Application into the nose 2 (two) times daily for 42 doses. Use as directed 2 times daily for 5 days every other week for 6 weeks.   Nexletol  180 MG Tabs Generic drug: Bempedoic Acid  Take 1 tablet (180 mg total) by mouth daily. Please schedule MD appt for further refills   omeprazole  20 MG capsule Commonly known as: PRILOSEC TAKE 1 CAPSULE BY MOUTH AT BEDTIME.   ondansetron  4 MG tablet Commonly known as: Zofran  Take 1 tablet (4 mg total) by mouth every 8 (eight) hours as needed for nausea or vomiting.   OSTEO BI-FLEX JOINT SHIELD PO Take 1 tablet by mouth daily.   pantoprazole  40 MG tablet Commonly known as: Protonix  Take 1 tablet (40 mg total) by mouth daily.   polyethylene glycol powder 17 GM/SCOOP powder Commonly known as: GLYCOLAX /MIRALAX  Dissolve 1 capful (17g) in 4-8 ounces of liquid and take by mouth daily.   senna 8.6 MG Tabs tablet Commonly known as: SENOKOT Take 2 tablets (17.2 mg total) by mouth at bedtime for 14 days.   traMADol  50 MG  tablet Commonly known as: ULTRAM  Take 1-2 tablets (50-100 mg total) by mouth every 6 (six) hours as needed for severe pain (pain score 7-10).   VITAMIN D  (CHOLECALCIFEROL) PO Take 1 tablet by mouth daily.               Discharge Care Instructions  (From admission, onward)           Start     Ordered   12/31/23 0000  Change dressing       Comments: Maintain surgical dressing until follow up in the clinic. If the edges start to pull up, may reinforce with tape. If the dressing is no longer working, may remove and cover with gauze and tape,  but must keep the area dry and clean.  Call with any questions or concerns.   12/31/23 0800            Diagnostic Studies: DG Pelvis Portable Result Date: 12/30/2023 CLINICAL DATA:  Status post hip arthroplasty. EXAM: PORTABLE PELVIS 1-2 VIEWS COMPARISON:  None Available. FINDINGS: Right hip arthroplasty in expected alignment. No periprosthetic lucency or fracture. Recent postsurgical change includes air and edema in the soft tissues. IMPRESSION: Right hip arthroplasty without immediate postoperative complication. Electronically Signed   By: Andrea Gasman M.D.   On: 12/30/2023 17:17   DG HIP UNILAT WITH PELVIS 1V RIGHT Result Date: 12/30/2023 CLINICAL DATA:  Elective surgery. EXAM: DG HIP (WITH OR WITHOUT PELVIS) 1V RIGHT COMPARISON:  None Available. FINDINGS: Two fluoroscopic spot views of the pelvis and right hip obtained in the operating room. Images during hip arthroplasty. Fluoroscopy time 12 seconds. Dose 1.038 mGy. IMPRESSION: Intraoperative fluoroscopy during right hip arthroplasty. Electronically Signed   By: Andrea Gasman M.D.   On: 12/30/2023 17:17   DG C-Arm 1-60 Min-No Report Result Date: 12/30/2023 Fluoroscopy was utilized by the requesting physician.  No radiographic interpretation.   DG C-Arm 1-60 Min-No Report Result Date: 12/30/2023 Fluoroscopy was utilized by the requesting physician.  No radiographic  interpretation.    Disposition: Discharge disposition: 01-Home or Self Care       Discharge Instructions     Call MD / Call 911   Complete by: As directed    If you experience chest pain or shortness of breath, CALL 911 and be transported to the hospital emergency room.  If you develope a fever above 101 F, pus (white drainage) or increased drainage or redness at the wound, or calf pain, call your surgeon's office.   Change dressing   Complete by: As directed    Maintain surgical dressing until follow up in the clinic. If the edges start to pull up, may reinforce with tape. If the dressing is no longer working, may remove and cover with gauze and tape, but must keep the area dry and clean.  Call with any questions or concerns.   Constipation Prevention   Complete by: As directed    Drink plenty of fluids.  Prune juice may be helpful.  You may use a stool softener, such as Colace (over the counter) 100 mg twice a day.  Use MiraLax  (over the counter) for constipation as needed.   Diet - low sodium heart healthy   Complete by: As directed    Increase activity slowly as tolerated   Complete by: As directed    Weight bearing as tolerated with assist device (walker, cane, etc) as directed, use it as long as suggested by your surgeon or therapist, typically at least 4-6 weeks.   Post-operative opioid taper instructions:   Complete by: As directed    POST-OPERATIVE OPIOID TAPER INSTRUCTIONS: It is important to wean off of your opioid medication as soon as possible. If you do not need pain medication after your surgery it is ok to stop day one. Opioids include: Codeine, Hydrocodone (Norco, Vicodin), Oxycodone(Percocet, oxycontin) and hydromorphone  amongst others.  Long term and even short term use of opiods can cause: Increased pain response Dependence Constipation Depression Respiratory depression And more.  Withdrawal symptoms can include Flu like symptoms Nausea, vomiting And  more Techniques to manage these symptoms Hydrate well Eat regular healthy meals Stay active Use relaxation techniques(deep breathing, meditating, yoga) Do Not substitute Alcohol to help with tapering If you  have been on opioids for less than two weeks and do not have pain than it is ok to stop all together.  Plan to wean off of opioids This plan should start within one week post op of your joint replacement. Maintain the same interval or time between taking each dose and first decrease the dose.  Cut the total daily intake of opioids by one tablet each day Next start to increase the time between doses. The last dose that should be eliminated is the evening dose.      TED hose   Complete by: As directed    Use stockings (TED hose) for 2 weeks on both leg(s).  You may remove them at night for sleeping.        Follow-up Information     Patti Rosina SAUNDERS, PA-C. Go on 01/14/2024.   Specialty: Orthopedic Surgery Why: You are scheduled for a post op appointment on Wednesday 01/14/24 at 9:30am Contact information: [address] [place] [address] [phone]                  Signed: Rosina SAUNDERS Patti 01/12/2024, 4:20 PM

## 2024-02-01 ENCOUNTER — Other Ambulatory Visit: Payer: Self-pay | Admitting: Cardiovascular Disease

## 2024-02-04 NOTE — Telephone Encounter (Signed)
 Any update from Dr. Ernie?

## 2024-02-12 ENCOUNTER — Other Ambulatory Visit: Payer: Self-pay | Admitting: *Deleted

## 2024-02-12 DIAGNOSIS — M810 Age-related osteoporosis without current pathological fracture: Secondary | ICD-10-CM

## 2024-02-12 MED ORDER — DENOSUMAB-BBDZ 60 MG/ML ~~LOC~~ SOSY
60.0000 mg | PREFILLED_SYRINGE | Freq: Once | SUBCUTANEOUS | Status: AC
  Administered 2024-02-26: 60 mg via SUBCUTANEOUS

## 2024-02-12 NOTE — Telephone Encounter (Signed)
 Okay to schedule for February? Looks like patient is also scheduled for a knee replacement on March 17th. Please advise.

## 2024-02-16 ENCOUNTER — Other Ambulatory Visit: Payer: Self-pay | Admitting: Cardiovascular Disease

## 2024-02-18 ENCOUNTER — Telehealth: Payer: Self-pay

## 2024-02-18 NOTE — Telephone Encounter (Signed)
 Prolia VOB initiated via [email or web address]  Next Prolia inj DUE: NOW

## 2024-02-20 ENCOUNTER — Other Ambulatory Visit (HOSPITAL_COMMUNITY): Payer: Self-pay

## 2024-02-20 NOTE — Telephone Encounter (Signed)
 Diana Cisneros

## 2024-02-20 NOTE — Telephone Encounter (Signed)
 Buy/Bill (Office supplied medication)  Out-of-pocket cost due at time of clinic visit: $387  Number of injection/visits approved: 2  Primary: [employer] Co-insurance: 20% Admin fee co-insurance: $35  Secondary: --- Co-insurance:  Admin fee co-insurance:   Medical Benefit Details: Date Benefits were checked: 02/19/24 Deductible: NO/ Coinsurance: 20%/ Admin Fee: $35  Prior Auth: APPROVED PA# [id number] Expiration Date: 02/20/24-02/19/25  # of doses approved: 2 -----------------------------------------------------------------------  Patient NOT eligible for Copay Card. Copay Card can make patient's cost as little as $25. Link to apply: [email or web address]://www.[email or web address]  ** This summary of benefits is an estimation of the patient's out-of-pocket cost. Exact cost may very based on individual plan coverage.

## 2024-02-26 ENCOUNTER — Ambulatory Visit

## 2024-02-26 DIAGNOSIS — M810 Age-related osteoporosis without current pathological fracture: Secondary | ICD-10-CM | POA: Insufficient documentation

## 2024-02-26 NOTE — Progress Notes (Signed)
 Pt given Jubbonti  SQ in left arm, per Pt's request. (IC, [facility])

## 2024-03-17 ENCOUNTER — Ambulatory Visit (HOSPITAL_BASED_OUTPATIENT_CLINIC_OR_DEPARTMENT_OTHER)

## 2024-03-24 ENCOUNTER — Ambulatory Visit: Admitting: Podiatry

## 2024-03-30 ENCOUNTER — Encounter: Admitting: Obstetrics and Gynecology

## 2024-04-13 ENCOUNTER — Ambulatory Visit (HOSPITAL_COMMUNITY): Admit: 2024-04-13 | Admitting: Orthopedic Surgery

## 2024-04-13 SURGERY — ARTHROPLASTY, KNEE, TOTAL
Anesthesia: Spinal | Site: Knee | Laterality: Right

## 2024-06-11 ENCOUNTER — Ambulatory Visit: Admitting: Podiatry
